# Patient Record
Sex: Male | Born: 1935 | Race: White | Hispanic: No | Marital: Married | Smoking: Never smoker
Health system: Southern US, Community
[De-identification: ages and names within clinical notes are randomized; demographics above are authoritative.]

## PROBLEM LIST (undated history)

## (undated) DIAGNOSIS — I639 Cerebral infarction, unspecified: Secondary | ICD-10-CM

## (undated) DIAGNOSIS — R7401 Elevation of levels of liver transaminase levels: Secondary | ICD-10-CM

## (undated) DIAGNOSIS — Z8551 Personal history of malignant neoplasm of bladder: Secondary | ICD-10-CM

## (undated) DIAGNOSIS — I350 Nonrheumatic aortic (valve) stenosis: Secondary | ICD-10-CM

## (undated) DIAGNOSIS — I5042 Chronic combined systolic (congestive) and diastolic (congestive) heart failure: Secondary | ICD-10-CM

## (undated) DIAGNOSIS — R0600 Dyspnea, unspecified: Secondary | ICD-10-CM

## (undated) DIAGNOSIS — E119 Type 2 diabetes mellitus without complications: Secondary | ICD-10-CM

## (undated) DIAGNOSIS — I509 Heart failure, unspecified: Secondary | ICD-10-CM

## (undated) DIAGNOSIS — Z87442 Personal history of urinary calculi: Secondary | ICD-10-CM

## (undated) DIAGNOSIS — Z8673 Personal history of transient ischemic attack (TIA), and cerebral infarction without residual deficits: Secondary | ICD-10-CM

## (undated) DIAGNOSIS — I10 Essential (primary) hypertension: Secondary | ICD-10-CM

## (undated) DIAGNOSIS — I480 Paroxysmal atrial fibrillation: Secondary | ICD-10-CM

## (undated) DIAGNOSIS — C801 Malignant (primary) neoplasm, unspecified: Secondary | ICD-10-CM

## (undated) DIAGNOSIS — I2510 Atherosclerotic heart disease of native coronary artery without angina pectoris: Secondary | ICD-10-CM

## (undated) HISTORY — DX: Nonrheumatic aortic (valve) stenosis: I35.0

## (undated) HISTORY — PX: JOINT REPLACEMENT: SHX530

## (undated) HISTORY — PX: HERNIA REPAIR: SHX51

## (undated) HISTORY — DX: Personal history of transient ischemic attack (TIA), and cerebral infarction without residual deficits: Z86.73

## (undated) HISTORY — DX: Personal history of malignant neoplasm of bladder: Z85.51

## (undated) HISTORY — DX: Paroxysmal atrial fibrillation: I48.0

## (undated) HISTORY — PX: CARDIAC CATHETERIZATION: SHX172

## (undated) HISTORY — PX: PROSTATE SURGERY: SHX751

## (undated) HISTORY — DX: Elevation of levels of liver transaminase levels: R74.01

## (undated) HISTORY — DX: Chronic combined systolic (congestive) and diastolic (congestive) heart failure: I50.42

---

## 2006-11-10 ENCOUNTER — Ambulatory Visit (HOSPITAL_COMMUNITY): Admission: RE | Admit: 2006-11-10 | Discharge: 2006-11-10 | Payer: Self-pay | Admitting: Urology

## 2011-09-01 DIAGNOSIS — N529 Male erectile dysfunction, unspecified: Secondary | ICD-10-CM | POA: Insufficient documentation

## 2011-09-01 DIAGNOSIS — R21 Rash and other nonspecific skin eruption: Secondary | ICD-10-CM | POA: Insufficient documentation

## 2013-02-14 DIAGNOSIS — R310 Gross hematuria: Secondary | ICD-10-CM | POA: Insufficient documentation

## 2013-02-18 DIAGNOSIS — C679 Malignant neoplasm of bladder, unspecified: Secondary | ICD-10-CM | POA: Insufficient documentation

## 2013-02-23 DIAGNOSIS — I10 Essential (primary) hypertension: Secondary | ICD-10-CM | POA: Insufficient documentation

## 2013-02-23 DIAGNOSIS — E785 Hyperlipidemia, unspecified: Secondary | ICD-10-CM

## 2013-02-23 DIAGNOSIS — I2510 Atherosclerotic heart disease of native coronary artery without angina pectoris: Secondary | ICD-10-CM

## 2013-02-23 DIAGNOSIS — R011 Cardiac murmur, unspecified: Secondary | ICD-10-CM

## 2013-02-23 HISTORY — DX: Cardiac murmur, unspecified: R01.1

## 2013-02-23 HISTORY — DX: Essential (primary) hypertension: I10

## 2013-02-23 HISTORY — DX: Atherosclerotic heart disease of native coronary artery without angina pectoris: I25.10

## 2013-02-23 HISTORY — DX: Hyperlipidemia, unspecified: E78.5

## 2013-02-25 DIAGNOSIS — E119 Type 2 diabetes mellitus without complications: Secondary | ICD-10-CM

## 2013-02-25 HISTORY — DX: Type 2 diabetes mellitus without complications: E11.9

## 2013-04-04 DIAGNOSIS — Z8546 Personal history of malignant neoplasm of prostate: Secondary | ICD-10-CM

## 2013-04-04 HISTORY — DX: Personal history of malignant neoplasm of prostate: Z85.46

## 2013-09-22 DIAGNOSIS — C61 Malignant neoplasm of prostate: Secondary | ICD-10-CM | POA: Insufficient documentation

## 2013-09-22 DIAGNOSIS — C679 Malignant neoplasm of bladder, unspecified: Secondary | ICD-10-CM

## 2013-09-22 HISTORY — DX: Malignant neoplasm of bladder, unspecified: C67.9

## 2014-06-23 DIAGNOSIS — C672 Malignant neoplasm of lateral wall of bladder: Secondary | ICD-10-CM | POA: Insufficient documentation

## 2015-01-01 DIAGNOSIS — C672 Malignant neoplasm of lateral wall of bladder: Secondary | ICD-10-CM | POA: Insufficient documentation

## 2015-04-09 DIAGNOSIS — I25119 Atherosclerotic heart disease of native coronary artery with unspecified angina pectoris: Secondary | ICD-10-CM | POA: Insufficient documentation

## 2015-04-09 DIAGNOSIS — I340 Nonrheumatic mitral (valve) insufficiency: Secondary | ICD-10-CM

## 2015-04-09 DIAGNOSIS — I351 Nonrheumatic aortic (valve) insufficiency: Secondary | ICD-10-CM | POA: Insufficient documentation

## 2015-04-09 HISTORY — DX: Atherosclerotic heart disease of native coronary artery with unspecified angina pectoris: I25.119

## 2015-04-09 HISTORY — DX: Nonrheumatic mitral (valve) insufficiency: I34.0

## 2015-04-09 HISTORY — DX: Nonrheumatic aortic (valve) insufficiency: I35.1

## 2015-06-06 DIAGNOSIS — I493 Ventricular premature depolarization: Secondary | ICD-10-CM

## 2015-06-06 HISTORY — DX: Ventricular premature depolarization: I49.3

## 2015-07-02 DIAGNOSIS — C61 Malignant neoplasm of prostate: Secondary | ICD-10-CM | POA: Insufficient documentation

## 2015-10-30 DIAGNOSIS — R011 Cardiac murmur, unspecified: Secondary | ICD-10-CM | POA: Diagnosis not present

## 2015-10-30 DIAGNOSIS — Z8551 Personal history of malignant neoplasm of bladder: Secondary | ICD-10-CM | POA: Diagnosis not present

## 2015-10-30 DIAGNOSIS — I2510 Atherosclerotic heart disease of native coronary artery without angina pectoris: Secondary | ICD-10-CM | POA: Diagnosis not present

## 2015-10-30 DIAGNOSIS — I10 Essential (primary) hypertension: Secondary | ICD-10-CM | POA: Diagnosis not present

## 2015-10-30 DIAGNOSIS — Z955 Presence of coronary angioplasty implant and graft: Secondary | ICD-10-CM | POA: Diagnosis not present

## 2015-10-30 DIAGNOSIS — I252 Old myocardial infarction: Secondary | ICD-10-CM | POA: Diagnosis not present

## 2015-10-30 DIAGNOSIS — N529 Male erectile dysfunction, unspecified: Secondary | ICD-10-CM | POA: Diagnosis not present

## 2015-10-30 DIAGNOSIS — E785 Hyperlipidemia, unspecified: Secondary | ICD-10-CM | POA: Diagnosis not present

## 2015-10-30 DIAGNOSIS — E119 Type 2 diabetes mellitus without complications: Secondary | ICD-10-CM | POA: Diagnosis not present

## 2015-10-30 DIAGNOSIS — C672 Malignant neoplasm of lateral wall of bladder: Secondary | ICD-10-CM | POA: Diagnosis not present

## 2015-10-30 DIAGNOSIS — H9190 Unspecified hearing loss, unspecified ear: Secondary | ICD-10-CM | POA: Diagnosis not present

## 2015-10-30 DIAGNOSIS — C679 Malignant neoplasm of bladder, unspecified: Secondary | ICD-10-CM | POA: Diagnosis not present

## 2015-11-22 DIAGNOSIS — E1169 Type 2 diabetes mellitus with other specified complication: Secondary | ICD-10-CM | POA: Diagnosis not present

## 2015-11-22 DIAGNOSIS — I129 Hypertensive chronic kidney disease with stage 1 through stage 4 chronic kidney disease, or unspecified chronic kidney disease: Secondary | ICD-10-CM | POA: Diagnosis not present

## 2015-11-22 DIAGNOSIS — E782 Mixed hyperlipidemia: Secondary | ICD-10-CM | POA: Diagnosis not present

## 2015-11-29 DIAGNOSIS — C672 Malignant neoplasm of lateral wall of bladder: Secondary | ICD-10-CM | POA: Diagnosis not present

## 2015-11-30 DIAGNOSIS — I129 Hypertensive chronic kidney disease with stage 1 through stage 4 chronic kidney disease, or unspecified chronic kidney disease: Secondary | ICD-10-CM | POA: Diagnosis not present

## 2015-11-30 DIAGNOSIS — N182 Chronic kidney disease, stage 2 (mild): Secondary | ICD-10-CM | POA: Diagnosis not present

## 2015-11-30 DIAGNOSIS — E785 Hyperlipidemia, unspecified: Secondary | ICD-10-CM | POA: Diagnosis not present

## 2015-11-30 DIAGNOSIS — E1149 Type 2 diabetes mellitus with other diabetic neurological complication: Secondary | ICD-10-CM | POA: Diagnosis not present

## 2015-11-30 DIAGNOSIS — E1165 Type 2 diabetes mellitus with hyperglycemia: Secondary | ICD-10-CM | POA: Diagnosis not present

## 2015-12-06 DIAGNOSIS — C672 Malignant neoplasm of lateral wall of bladder: Secondary | ICD-10-CM | POA: Diagnosis not present

## 2015-12-13 DIAGNOSIS — C672 Malignant neoplasm of lateral wall of bladder: Secondary | ICD-10-CM | POA: Diagnosis not present

## 2015-12-21 DIAGNOSIS — C672 Malignant neoplasm of lateral wall of bladder: Secondary | ICD-10-CM | POA: Diagnosis not present

## 2015-12-27 DIAGNOSIS — C672 Malignant neoplasm of lateral wall of bladder: Secondary | ICD-10-CM | POA: Diagnosis not present

## 2015-12-28 DIAGNOSIS — E1140 Type 2 diabetes mellitus with diabetic neuropathy, unspecified: Secondary | ICD-10-CM | POA: Diagnosis not present

## 2016-01-03 DIAGNOSIS — C672 Malignant neoplasm of lateral wall of bladder: Secondary | ICD-10-CM | POA: Diagnosis not present

## 2016-02-01 DIAGNOSIS — C672 Malignant neoplasm of lateral wall of bladder: Secondary | ICD-10-CM | POA: Diagnosis not present

## 2016-02-01 DIAGNOSIS — C61 Malignant neoplasm of prostate: Secondary | ICD-10-CM | POA: Diagnosis not present

## 2016-02-04 DIAGNOSIS — E119 Type 2 diabetes mellitus without complications: Secondary | ICD-10-CM | POA: Diagnosis not present

## 2016-02-04 DIAGNOSIS — Z7984 Long term (current) use of oral hypoglycemic drugs: Secondary | ICD-10-CM | POA: Diagnosis not present

## 2016-02-04 DIAGNOSIS — Z794 Long term (current) use of insulin: Secondary | ICD-10-CM | POA: Diagnosis not present

## 2016-02-04 DIAGNOSIS — H40003 Preglaucoma, unspecified, bilateral: Secondary | ICD-10-CM | POA: Diagnosis not present

## 2016-02-12 DIAGNOSIS — I10 Essential (primary) hypertension: Secondary | ICD-10-CM | POA: Diagnosis not present

## 2016-02-12 DIAGNOSIS — Z8551 Personal history of malignant neoplasm of bladder: Secondary | ICD-10-CM | POA: Diagnosis not present

## 2016-02-12 DIAGNOSIS — C672 Malignant neoplasm of lateral wall of bladder: Secondary | ICD-10-CM | POA: Diagnosis not present

## 2016-02-12 DIAGNOSIS — R011 Cardiac murmur, unspecified: Secondary | ICD-10-CM | POA: Diagnosis not present

## 2016-02-12 DIAGNOSIS — Z9079 Acquired absence of other genital organ(s): Secondary | ICD-10-CM | POA: Diagnosis not present

## 2016-02-12 DIAGNOSIS — N529 Male erectile dysfunction, unspecified: Secondary | ICD-10-CM | POA: Diagnosis not present

## 2016-02-12 DIAGNOSIS — N3289 Other specified disorders of bladder: Secondary | ICD-10-CM | POA: Diagnosis not present

## 2016-02-12 DIAGNOSIS — E119 Type 2 diabetes mellitus without complications: Secondary | ICD-10-CM | POA: Diagnosis not present

## 2016-02-12 DIAGNOSIS — E785 Hyperlipidemia, unspecified: Secondary | ICD-10-CM | POA: Diagnosis not present

## 2016-02-12 DIAGNOSIS — N3010 Interstitial cystitis (chronic) without hematuria: Secondary | ICD-10-CM | POA: Diagnosis not present

## 2016-02-12 DIAGNOSIS — C679 Malignant neoplasm of bladder, unspecified: Secondary | ICD-10-CM | POA: Diagnosis not present

## 2016-02-12 DIAGNOSIS — I2510 Atherosclerotic heart disease of native coronary artery without angina pectoris: Secondary | ICD-10-CM | POA: Diagnosis not present

## 2016-02-12 DIAGNOSIS — N135 Crossing vessel and stricture of ureter without hydronephrosis: Secondary | ICD-10-CM | POA: Diagnosis not present

## 2016-03-27 DIAGNOSIS — E782 Mixed hyperlipidemia: Secondary | ICD-10-CM | POA: Diagnosis not present

## 2016-03-27 DIAGNOSIS — E785 Hyperlipidemia, unspecified: Secondary | ICD-10-CM | POA: Diagnosis not present

## 2016-03-27 DIAGNOSIS — E1169 Type 2 diabetes mellitus with other specified complication: Secondary | ICD-10-CM | POA: Diagnosis not present

## 2016-03-28 DIAGNOSIS — E1140 Type 2 diabetes mellitus with diabetic neuropathy, unspecified: Secondary | ICD-10-CM | POA: Diagnosis not present

## 2016-04-07 DIAGNOSIS — Z0000 Encounter for general adult medical examination without abnormal findings: Secondary | ICD-10-CM | POA: Diagnosis not present

## 2016-04-07 DIAGNOSIS — Z139 Encounter for screening, unspecified: Secondary | ICD-10-CM | POA: Diagnosis not present

## 2016-04-07 DIAGNOSIS — N182 Chronic kidney disease, stage 2 (mild): Secondary | ICD-10-CM | POA: Diagnosis not present

## 2016-04-07 DIAGNOSIS — I129 Hypertensive chronic kidney disease with stage 1 through stage 4 chronic kidney disease, or unspecified chronic kidney disease: Secondary | ICD-10-CM | POA: Diagnosis not present

## 2016-04-07 DIAGNOSIS — E782 Mixed hyperlipidemia: Secondary | ICD-10-CM | POA: Diagnosis not present

## 2016-04-07 DIAGNOSIS — Z1389 Encounter for screening for other disorder: Secondary | ICD-10-CM | POA: Diagnosis not present

## 2016-04-07 DIAGNOSIS — Z7189 Other specified counseling: Secondary | ICD-10-CM | POA: Diagnosis not present

## 2016-04-07 DIAGNOSIS — E1169 Type 2 diabetes mellitus with other specified complication: Secondary | ICD-10-CM | POA: Diagnosis not present

## 2016-06-13 DIAGNOSIS — C672 Malignant neoplasm of lateral wall of bladder: Secondary | ICD-10-CM | POA: Diagnosis not present

## 2016-06-13 DIAGNOSIS — C61 Malignant neoplasm of prostate: Secondary | ICD-10-CM | POA: Diagnosis not present

## 2016-06-17 DIAGNOSIS — C672 Malignant neoplasm of lateral wall of bladder: Secondary | ICD-10-CM | POA: Diagnosis not present

## 2016-06-23 DIAGNOSIS — C672 Malignant neoplasm of lateral wall of bladder: Secondary | ICD-10-CM | POA: Diagnosis not present

## 2016-06-30 DIAGNOSIS — E1140 Type 2 diabetes mellitus with diabetic neuropathy, unspecified: Secondary | ICD-10-CM | POA: Diagnosis not present

## 2016-07-03 DIAGNOSIS — C672 Malignant neoplasm of lateral wall of bladder: Secondary | ICD-10-CM | POA: Diagnosis not present

## 2016-08-05 DIAGNOSIS — E1165 Type 2 diabetes mellitus with hyperglycemia: Secondary | ICD-10-CM | POA: Diagnosis not present

## 2016-08-05 DIAGNOSIS — Z23 Encounter for immunization: Secondary | ICD-10-CM | POA: Diagnosis not present

## 2016-08-05 DIAGNOSIS — E1169 Type 2 diabetes mellitus with other specified complication: Secondary | ICD-10-CM | POA: Diagnosis not present

## 2016-08-05 DIAGNOSIS — E1149 Type 2 diabetes mellitus with other diabetic neurological complication: Secondary | ICD-10-CM | POA: Diagnosis not present

## 2016-08-05 DIAGNOSIS — I129 Hypertensive chronic kidney disease with stage 1 through stage 4 chronic kidney disease, or unspecified chronic kidney disease: Secondary | ICD-10-CM | POA: Diagnosis not present

## 2016-08-11 DIAGNOSIS — E1169 Type 2 diabetes mellitus with other specified complication: Secondary | ICD-10-CM | POA: Diagnosis not present

## 2016-08-11 DIAGNOSIS — I129 Hypertensive chronic kidney disease with stage 1 through stage 4 chronic kidney disease, or unspecified chronic kidney disease: Secondary | ICD-10-CM | POA: Diagnosis not present

## 2016-08-11 DIAGNOSIS — E782 Mixed hyperlipidemia: Secondary | ICD-10-CM | POA: Diagnosis not present

## 2016-08-11 DIAGNOSIS — N182 Chronic kidney disease, stage 2 (mild): Secondary | ICD-10-CM | POA: Diagnosis not present

## 2016-09-03 DIAGNOSIS — S99911A Unspecified injury of right ankle, initial encounter: Secondary | ICD-10-CM | POA: Diagnosis not present

## 2016-09-03 DIAGNOSIS — Z6830 Body mass index (BMI) 30.0-30.9, adult: Secondary | ICD-10-CM | POA: Diagnosis not present

## 2016-09-04 DIAGNOSIS — I739 Peripheral vascular disease, unspecified: Secondary | ICD-10-CM | POA: Diagnosis not present

## 2016-09-04 DIAGNOSIS — M19071 Primary osteoarthritis, right ankle and foot: Secondary | ICD-10-CM | POA: Diagnosis not present

## 2016-09-04 DIAGNOSIS — S99911A Unspecified injury of right ankle, initial encounter: Secondary | ICD-10-CM | POA: Diagnosis not present

## 2016-09-10 DIAGNOSIS — I350 Nonrheumatic aortic (valve) stenosis: Secondary | ICD-10-CM | POA: Diagnosis not present

## 2016-09-10 DIAGNOSIS — I639 Cerebral infarction, unspecified: Secondary | ICD-10-CM | POA: Diagnosis not present

## 2016-09-10 DIAGNOSIS — I2510 Atherosclerotic heart disease of native coronary artery without angina pectoris: Secondary | ICD-10-CM | POA: Diagnosis not present

## 2016-09-10 DIAGNOSIS — K219 Gastro-esophageal reflux disease without esophagitis: Secondary | ICD-10-CM | POA: Diagnosis not present

## 2016-09-10 DIAGNOSIS — R42 Dizziness and giddiness: Secondary | ICD-10-CM | POA: Diagnosis not present

## 2016-09-10 DIAGNOSIS — Z8546 Personal history of malignant neoplasm of prostate: Secondary | ICD-10-CM | POA: Diagnosis not present

## 2016-09-10 DIAGNOSIS — I10 Essential (primary) hypertension: Secondary | ICD-10-CM | POA: Diagnosis not present

## 2016-09-10 DIAGNOSIS — I340 Nonrheumatic mitral (valve) insufficiency: Secondary | ICD-10-CM | POA: Diagnosis not present

## 2016-09-10 DIAGNOSIS — Z8679 Personal history of other diseases of the circulatory system: Secondary | ICD-10-CM | POA: Diagnosis not present

## 2016-09-10 DIAGNOSIS — I361 Nonrheumatic tricuspid (valve) insufficiency: Secondary | ICD-10-CM | POA: Diagnosis not present

## 2016-09-10 DIAGNOSIS — Z794 Long term (current) use of insulin: Secondary | ICD-10-CM | POA: Diagnosis not present

## 2016-09-10 DIAGNOSIS — E119 Type 2 diabetes mellitus without complications: Secondary | ICD-10-CM | POA: Diagnosis not present

## 2016-09-10 DIAGNOSIS — R51 Headache: Secondary | ICD-10-CM | POA: Diagnosis not present

## 2016-09-10 DIAGNOSIS — I6523 Occlusion and stenosis of bilateral carotid arteries: Secondary | ICD-10-CM | POA: Diagnosis not present

## 2016-09-10 DIAGNOSIS — M1990 Unspecified osteoarthritis, unspecified site: Secondary | ICD-10-CM | POA: Diagnosis not present

## 2016-09-10 DIAGNOSIS — E7800 Pure hypercholesterolemia, unspecified: Secondary | ICD-10-CM | POA: Diagnosis not present

## 2016-09-10 DIAGNOSIS — E785 Hyperlipidemia, unspecified: Secondary | ICD-10-CM | POA: Diagnosis not present

## 2016-09-11 DIAGNOSIS — I6523 Occlusion and stenosis of bilateral carotid arteries: Secondary | ICD-10-CM | POA: Diagnosis not present

## 2016-09-11 DIAGNOSIS — I10 Essential (primary) hypertension: Secondary | ICD-10-CM | POA: Diagnosis not present

## 2016-09-11 DIAGNOSIS — E119 Type 2 diabetes mellitus without complications: Secondary | ICD-10-CM | POA: Diagnosis not present

## 2016-09-11 DIAGNOSIS — Z8679 Personal history of other diseases of the circulatory system: Secondary | ICD-10-CM | POA: Diagnosis not present

## 2016-09-11 DIAGNOSIS — I639 Cerebral infarction, unspecified: Secondary | ICD-10-CM | POA: Diagnosis not present

## 2016-09-12 DIAGNOSIS — I639 Cerebral infarction, unspecified: Secondary | ICD-10-CM | POA: Diagnosis not present

## 2016-09-12 DIAGNOSIS — E119 Type 2 diabetes mellitus without complications: Secondary | ICD-10-CM | POA: Diagnosis not present

## 2016-09-12 DIAGNOSIS — I10 Essential (primary) hypertension: Secondary | ICD-10-CM | POA: Diagnosis not present

## 2016-09-12 DIAGNOSIS — Z8679 Personal history of other diseases of the circulatory system: Secondary | ICD-10-CM | POA: Diagnosis not present

## 2016-09-17 DIAGNOSIS — Z6830 Body mass index (BMI) 30.0-30.9, adult: Secondary | ICD-10-CM | POA: Diagnosis not present

## 2016-09-17 DIAGNOSIS — M19071 Primary osteoarthritis, right ankle and foot: Secondary | ICD-10-CM | POA: Diagnosis not present

## 2016-09-17 DIAGNOSIS — I639 Cerebral infarction, unspecified: Secondary | ICD-10-CM | POA: Diagnosis not present

## 2016-09-26 DIAGNOSIS — C61 Malignant neoplasm of prostate: Secondary | ICD-10-CM | POA: Diagnosis not present

## 2016-09-26 DIAGNOSIS — C672 Malignant neoplasm of lateral wall of bladder: Secondary | ICD-10-CM | POA: Diagnosis not present

## 2016-09-29 DIAGNOSIS — M25571 Pain in right ankle and joints of right foot: Secondary | ICD-10-CM | POA: Diagnosis not present

## 2016-09-29 DIAGNOSIS — R2681 Unsteadiness on feet: Secondary | ICD-10-CM | POA: Diagnosis not present

## 2016-09-29 DIAGNOSIS — R262 Difficulty in walking, not elsewhere classified: Secondary | ICD-10-CM | POA: Diagnosis not present

## 2016-09-30 DIAGNOSIS — E1140 Type 2 diabetes mellitus with diabetic neuropathy, unspecified: Secondary | ICD-10-CM | POA: Diagnosis not present

## 2016-10-21 DIAGNOSIS — E1149 Type 2 diabetes mellitus with other diabetic neurological complication: Secondary | ICD-10-CM | POA: Diagnosis not present

## 2016-10-21 DIAGNOSIS — E1169 Type 2 diabetes mellitus with other specified complication: Secondary | ICD-10-CM | POA: Diagnosis not present

## 2016-10-21 DIAGNOSIS — I129 Hypertensive chronic kidney disease with stage 1 through stage 4 chronic kidney disease, or unspecified chronic kidney disease: Secondary | ICD-10-CM | POA: Diagnosis not present

## 2016-10-27 DIAGNOSIS — E1165 Type 2 diabetes mellitus with hyperglycemia: Secondary | ICD-10-CM | POA: Diagnosis not present

## 2016-10-27 DIAGNOSIS — E785 Hyperlipidemia, unspecified: Secondary | ICD-10-CM | POA: Diagnosis not present

## 2016-10-27 DIAGNOSIS — I129 Hypertensive chronic kidney disease with stage 1 through stage 4 chronic kidney disease, or unspecified chronic kidney disease: Secondary | ICD-10-CM | POA: Diagnosis not present

## 2016-10-27 DIAGNOSIS — Z7189 Other specified counseling: Secondary | ICD-10-CM | POA: Diagnosis not present

## 2016-10-27 DIAGNOSIS — E1149 Type 2 diabetes mellitus with other diabetic neurological complication: Secondary | ICD-10-CM | POA: Diagnosis not present

## 2016-11-13 ENCOUNTER — Ambulatory Visit (INDEPENDENT_AMBULATORY_CARE_PROVIDER_SITE_OTHER): Payer: Medicare Other

## 2016-11-13 ENCOUNTER — Ambulatory Visit (INDEPENDENT_AMBULATORY_CARE_PROVIDER_SITE_OTHER): Payer: Medicare Other | Admitting: Sports Medicine

## 2016-11-13 ENCOUNTER — Encounter: Payer: Self-pay | Admitting: Sports Medicine

## 2016-11-13 DIAGNOSIS — G8929 Other chronic pain: Secondary | ICD-10-CM

## 2016-11-13 DIAGNOSIS — M79671 Pain in right foot: Secondary | ICD-10-CM

## 2016-11-13 DIAGNOSIS — R2681 Unsteadiness on feet: Secondary | ICD-10-CM | POA: Diagnosis not present

## 2016-11-13 DIAGNOSIS — M19079 Primary osteoarthritis, unspecified ankle and foot: Secondary | ICD-10-CM

## 2016-11-13 DIAGNOSIS — M25571 Pain in right ankle and joints of right foot: Secondary | ICD-10-CM | POA: Diagnosis not present

## 2016-11-13 NOTE — Progress Notes (Signed)
Subjective:  Bernard Kim is a 83 y.o. male patient who presents to office for evaluation of right chronic ankle pain. Patient complains of continued pain in the ankle. Patient has tried OTC brace, PT, and Tyelnol arthritis once a day with no relief in symptoms. Patient denies any other pedal complaints. States that he is afraid because his ankle gives out sometimes from under him and he is scared of fall that may cause other problems at his age.   Patient Active Problem List   Diagnosis Date Noted  . Malignant tumor of prostate ([place]) 07/02/2015  . PVC (premature ventricular contraction) 06/06/2015  . Aortic stenosis, mild 04/09/2015  . Coronary artery disease involving native coronary artery of native heart with angina pectoris ([place]) 04/09/2015  . Essential hypertension 04/09/2015  . Nonrheumatic aortic valve insufficiency 04/09/2015  . Non-rheumatic mitral regurgitation 04/09/2015  . Malignant neoplasm of lateral wall of urinary bladder ([place]) 01/01/2015  . Malignant neoplasm of lateral wall of bladder ([place]) 06/23/2014  . Bladder cancer ([place]) 09/22/2013  . Prostate cancer ([place]) 09/22/2013  . History of prostate cancer 04/04/2013  . Diabetes mellitus, type 2 ([place]) 02/25/2013  . Coronary artery disease 02/23/2013  . Hyperlipidemia 02/23/2013  . Hypertension 02/23/2013  . Murmur 02/23/2013  . Malignant neoplasm of urinary bladder ([place]) 02/18/2013  . Gross hematuria 02/14/2013  . ED (erectile dysfunction) of organic origin 09/01/2011  . Rash of groin 09/01/2011    No current outpatient prescriptions on file prior to visit.   No current facility-administered medications on file prior to visit.     No Known Allergies  Objective:  General: Alert and oriented x3 in no acute distress  Dermatology: No open lesions bilateral lower extremities, no webspace macerations, no ecchymosis bilateral, all nails x 10 are well manicured.  Vascular: Dorsalis Pedis and Posterior Tibial pedal pulses  palpable, Capillary Fill Time 3 seconds,(-) pedal hair growth bilateral, Varcosities and trace edema bilateral lower extremities R>L, Temperature gradient within normal limits.  Neurology: Johney Maine sensation intact via light touch bilateral. (- )Tinels sign bilateral.   Musculoskeletal: Mild tenderness with palpation at Right ankle diffusely; Worse with weightbearing. Negative talar tilt, Negative tib-fib stress, Mild instability. No pain with calf compression bilateral. Range of motion limited on right ankle. Strength within normal limits in all groups bilateral.   Gait: Antalgic gait  Xrays  Right foot/Ankle   Impression:End stage ankle arthritis with inferior heel spur and lesser hammertoe. No other acute findings.   Assessment and Plan: Problem List Items Addressed This Visit    None    Visit Diagnoses    Chronic pain of right ankle    -  Primary   Relevant Orders   DG Foot 2 Views Right (Completed)   DG Ankle 2 Views Right (Completed)   Arthritis of ankle       Unsteady gait           -Complete examination performed -Xrays reviewed -Discussed treatement options for ankle arthritis  -Advised Tylenol arthritis twice daily -Rest and ice at end of day -Rx Moore balance brace -Patient declined steroid injection to right ankle at this time -Patient to return to office for bracing with Betha or sooner if condition worsens.  Landis Martins, DPM

## 2016-11-26 ENCOUNTER — Ambulatory Visit (INDEPENDENT_AMBULATORY_CARE_PROVIDER_SITE_OTHER): Payer: Medicare Other | Admitting: Sports Medicine

## 2016-11-26 DIAGNOSIS — M25571 Pain in right ankle and joints of right foot: Secondary | ICD-10-CM

## 2016-11-26 DIAGNOSIS — M19079 Primary osteoarthritis, unspecified ankle and foot: Secondary | ICD-10-CM

## 2016-11-26 DIAGNOSIS — R2681 Unsteadiness on feet: Secondary | ICD-10-CM

## 2016-11-26 DIAGNOSIS — G8929 Other chronic pain: Secondary | ICD-10-CM

## 2016-11-26 NOTE — Progress Notes (Signed)
Patient met with Bernard Kim and was fitted for [facility] balance brace. Patient to return to pick up brace when arrives -Dr. Cannon Kettle

## 2016-12-24 ENCOUNTER — Ambulatory Visit (INDEPENDENT_AMBULATORY_CARE_PROVIDER_SITE_OTHER): Payer: Medicare Other | Admitting: Sports Medicine

## 2016-12-24 DIAGNOSIS — M25571 Pain in right ankle and joints of right foot: Secondary | ICD-10-CM | POA: Diagnosis not present

## 2016-12-24 DIAGNOSIS — R2681 Unsteadiness on feet: Secondary | ICD-10-CM | POA: Diagnosis not present

## 2016-12-24 DIAGNOSIS — G8929 Other chronic pain: Secondary | ICD-10-CM

## 2016-12-24 DIAGNOSIS — M19079 Primary osteoarthritis, unspecified ankle and foot: Secondary | ICD-10-CM

## 2016-12-24 NOTE — Progress Notes (Signed)
Patient met with Bernard Kim. Moore Balance braces bilateral dispensed to patient. Patient to return as scheduled for follow up. -Dr. Cannon Kettle

## 2016-12-29 DIAGNOSIS — C672 Malignant neoplasm of lateral wall of bladder: Secondary | ICD-10-CM | POA: Diagnosis not present

## 2016-12-29 DIAGNOSIS — C61 Malignant neoplasm of prostate: Secondary | ICD-10-CM | POA: Diagnosis not present

## 2017-01-09 DIAGNOSIS — C672 Malignant neoplasm of lateral wall of bladder: Secondary | ICD-10-CM | POA: Diagnosis not present

## 2017-01-19 DIAGNOSIS — E1169 Type 2 diabetes mellitus with other specified complication: Secondary | ICD-10-CM | POA: Diagnosis not present

## 2017-01-19 DIAGNOSIS — I129 Hypertensive chronic kidney disease with stage 1 through stage 4 chronic kidney disease, or unspecified chronic kidney disease: Secondary | ICD-10-CM | POA: Diagnosis not present

## 2017-01-19 DIAGNOSIS — E1149 Type 2 diabetes mellitus with other diabetic neurological complication: Secondary | ICD-10-CM | POA: Diagnosis not present

## 2017-01-19 DIAGNOSIS — C672 Malignant neoplasm of lateral wall of bladder: Secondary | ICD-10-CM | POA: Diagnosis not present

## 2017-01-26 DIAGNOSIS — C672 Malignant neoplasm of lateral wall of bladder: Secondary | ICD-10-CM | POA: Diagnosis not present

## 2017-01-28 DIAGNOSIS — E782 Mixed hyperlipidemia: Secondary | ICD-10-CM | POA: Diagnosis not present

## 2017-01-28 DIAGNOSIS — I129 Hypertensive chronic kidney disease with stage 1 through stage 4 chronic kidney disease, or unspecified chronic kidney disease: Secondary | ICD-10-CM | POA: Diagnosis not present

## 2017-01-28 DIAGNOSIS — N182 Chronic kidney disease, stage 2 (mild): Secondary | ICD-10-CM | POA: Diagnosis not present

## 2017-01-28 DIAGNOSIS — E1169 Type 2 diabetes mellitus with other specified complication: Secondary | ICD-10-CM | POA: Diagnosis not present

## 2017-02-03 DIAGNOSIS — L821 Other seborrheic keratosis: Secondary | ICD-10-CM | POA: Diagnosis not present

## 2017-02-03 DIAGNOSIS — C44729 Squamous cell carcinoma of skin of left lower limb, including hip: Secondary | ICD-10-CM | POA: Diagnosis not present

## 2017-02-03 DIAGNOSIS — C44529 Squamous cell carcinoma of skin of other part of trunk: Secondary | ICD-10-CM | POA: Diagnosis not present

## 2017-02-03 DIAGNOSIS — L578 Other skin changes due to chronic exposure to nonionizing radiation: Secondary | ICD-10-CM | POA: Diagnosis not present

## 2017-02-04 ENCOUNTER — Encounter: Payer: Self-pay | Admitting: Sports Medicine

## 2017-02-04 ENCOUNTER — Ambulatory Visit (INDEPENDENT_AMBULATORY_CARE_PROVIDER_SITE_OTHER): Payer: Medicare Other | Admitting: Sports Medicine

## 2017-02-04 DIAGNOSIS — M2142 Flat foot [pes planus] (acquired), left foot: Secondary | ICD-10-CM

## 2017-02-04 DIAGNOSIS — M19079 Primary osteoarthritis, unspecified ankle and foot: Secondary | ICD-10-CM

## 2017-02-04 DIAGNOSIS — R2681 Unsteadiness on feet: Secondary | ICD-10-CM

## 2017-02-04 DIAGNOSIS — M25571 Pain in right ankle and joints of right foot: Secondary | ICD-10-CM | POA: Diagnosis not present

## 2017-02-04 DIAGNOSIS — M2141 Flat foot [pes planus] (acquired), right foot: Secondary | ICD-10-CM

## 2017-02-04 DIAGNOSIS — G8929 Other chronic pain: Secondary | ICD-10-CM

## 2017-02-04 DIAGNOSIS — E0842 Diabetes mellitus due to underlying condition with diabetic polyneuropathy: Secondary | ICD-10-CM

## 2017-02-04 NOTE — Progress Notes (Signed)
Subjective:  BOYD BUFFALO is a 83 y.o. male patient who returnss to office for evaluation for Balance braces to be check and to discuss diabetic shoes. Patient states that braces have helped with stability and pain. Patient states he desires to have diabetic shoes to wear with braces. Denies any other pedal complaints.   Patient Active Problem List   Diagnosis Date Noted  . Malignant tumor of prostate ([place]) 07/02/2015  . PVC (premature ventricular contraction) 06/06/2015  . Aortic stenosis, mild 04/09/2015  . Coronary artery disease involving native coronary artery of native heart with angina pectoris ([place]) 04/09/2015  . Essential hypertension 04/09/2015  . Nonrheumatic aortic valve insufficiency 04/09/2015  . Non-rheumatic mitral regurgitation 04/09/2015  . Malignant neoplasm of lateral wall of urinary bladder ([place]) 01/01/2015  . Malignant neoplasm of lateral wall of bladder ([place]) 06/23/2014  . Bladder cancer ([place]) 09/22/2013  . Prostate cancer ([place]) 09/22/2013  . History of prostate cancer 04/04/2013  . Diabetes mellitus, type 2 ([place]) 02/25/2013  . Coronary artery disease 02/23/2013  . Hyperlipidemia 02/23/2013  . Hypertension 02/23/2013  . Murmur 02/23/2013  . Malignant neoplasm of urinary bladder ([place]) 02/18/2013  . Gross hematuria 02/14/2013  . ED (erectile dysfunction) of organic origin 09/01/2011  . Rash of groin 09/01/2011    Current Outpatient Prescriptions on File Prior to Visit  Medication Sig Dispense Refill  . aspirin EC 81 MG tablet Take 81 mg by mouth.    . clopidogrel (PLAVIX) 75 MG tablet Take 75 mg by mouth.    . docusate sodium (COLACE) 100 MG capsule Take 100 mg by mouth.    . Fluocinolone Acetonide 0.01 % OIL as needed.     Marland Kitchen HYDROcodone-acetaminophen (NORCO/VICODIN) 5-325 MG tablet Take by mouth.    . Insulin Glargine (LANTUS SOLOSTAR) 100 UNIT/ML Solostar Pen     . insulin lispro (HUMALOG) 100 UNIT/ML KiwkPen Inject into the skin.    . Insulin Pen Needle  (BD PEN NEEDLE NANO U/F) 32G X 4 MM MISC USE 1 DAILY    . losartan (COZAAR) 25 MG tablet Take 25 mg by mouth.    . lovastatin (MEVACOR) 40 MG tablet TK 1 T PO QHS  1  . lovastatin (MEVACOR) 40 MG tablet Take 40 mg by mouth.    . metoprolol tartrate (LOPRESSOR) 25 MG tablet     . nitroGLYCERIN (NITROSTAT) 0.4 MG SL tablet     . pneumococcal 13-valent conjugate vaccine (PREVNAR 13) SUSP injection inject 0.5 milliliter intramuscularly    . sitaGLIPtin-metformin (JANUMET) 50-1000 MG tablet Take by mouth.    . triamcinolone cream (KENALOG) 0.1 %      No current facility-administered medications on file prior to visit.     No Known Allergies  Objective:  General: Alert and oriented x3 in no acute distress  Dermatology: No open lesions bilateral lower extremities, no webspace macerations, no ecchymosis bilateral, all nails x 10 are well manicured.  Vascular: Dorsalis Pedis and Posterior Tibial pedal pulses palpable, Capillary Fill Time 3 seconds,(-) pedal hair growth bilateral, Varcosities and trace edema bilateral lower extremities R>L, Temperature gradient within normal limits.  Neurology: Gross sensation intact via light touch bilateral. Protective and vibratory sensation diminished. (- )Tinels sign bilateral.   Musculoskeletal: Decreased tenderness with palpation at Right ankle diffusely. Planus foot type. No pain with calf compression bilateral. Range of motion limited on right ankle. Strength within normal limits in all groups bilateral.   Gait:Controlled with moore balance braces   Assessment and Plan:  Problem List Items Addressed This Visit    None    Visit Diagnoses    Unsteady gait    -  Primary   Arthritis of ankle       Chronic pain of right ankle       Pes planus of both feet       Diabetic polyneuropathy associated with diabetes mellitus due to underlying condition (HCC)           -Complete examination performed -Continue with Moore balance braces bilateral -Safe  step diabetic shoe order form was completed; office to contact primary care for approval / certification;  Office to arrange shoe fitting and dispensing. -Patient to return to office for diabetic shoe measurements and impressions when called or sooner if condition worsens.  Landis Martins, DPM

## 2017-02-05 DIAGNOSIS — E1140 Type 2 diabetes mellitus with diabetic neuropathy, unspecified: Secondary | ICD-10-CM | POA: Diagnosis not present

## 2017-02-09 DIAGNOSIS — E119 Type 2 diabetes mellitus without complications: Secondary | ICD-10-CM | POA: Diagnosis not present

## 2017-02-09 DIAGNOSIS — H40003 Preglaucoma, unspecified, bilateral: Secondary | ICD-10-CM | POA: Diagnosis not present

## 2017-02-09 DIAGNOSIS — Z794 Long term (current) use of insulin: Secondary | ICD-10-CM | POA: Diagnosis not present

## 2017-02-09 DIAGNOSIS — Z7984 Long term (current) use of oral hypoglycemic drugs: Secondary | ICD-10-CM | POA: Diagnosis not present

## 2017-02-10 DIAGNOSIS — C44519 Basal cell carcinoma of skin of other part of trunk: Secondary | ICD-10-CM | POA: Diagnosis not present

## 2017-02-12 DIAGNOSIS — C44719 Basal cell carcinoma of skin of left lower limb, including hip: Secondary | ICD-10-CM | POA: Diagnosis not present

## 2017-02-16 DIAGNOSIS — C44519 Basal cell carcinoma of skin of other part of trunk: Secondary | ICD-10-CM | POA: Diagnosis not present

## 2017-04-06 DIAGNOSIS — Z8551 Personal history of malignant neoplasm of bladder: Secondary | ICD-10-CM | POA: Diagnosis not present

## 2017-04-06 DIAGNOSIS — Z8546 Personal history of malignant neoplasm of prostate: Secondary | ICD-10-CM | POA: Diagnosis not present

## 2017-04-30 DIAGNOSIS — E1169 Type 2 diabetes mellitus with other specified complication: Secondary | ICD-10-CM | POA: Diagnosis not present

## 2017-04-30 DIAGNOSIS — I129 Hypertensive chronic kidney disease with stage 1 through stage 4 chronic kidney disease, or unspecified chronic kidney disease: Secondary | ICD-10-CM | POA: Diagnosis not present

## 2017-04-30 DIAGNOSIS — E1149 Type 2 diabetes mellitus with other diabetic neurological complication: Secondary | ICD-10-CM | POA: Diagnosis not present

## 2017-05-08 DIAGNOSIS — E1140 Type 2 diabetes mellitus with diabetic neuropathy, unspecified: Secondary | ICD-10-CM | POA: Diagnosis not present

## 2017-05-15 DIAGNOSIS — E1165 Type 2 diabetes mellitus with hyperglycemia: Secondary | ICD-10-CM | POA: Diagnosis not present

## 2017-05-15 DIAGNOSIS — E1149 Type 2 diabetes mellitus with other diabetic neurological complication: Secondary | ICD-10-CM | POA: Diagnosis not present

## 2017-05-15 DIAGNOSIS — E785 Hyperlipidemia, unspecified: Secondary | ICD-10-CM | POA: Diagnosis not present

## 2017-05-15 DIAGNOSIS — I129 Hypertensive chronic kidney disease with stage 1 through stage 4 chronic kidney disease, or unspecified chronic kidney disease: Secondary | ICD-10-CM | POA: Diagnosis not present

## 2017-07-10 DIAGNOSIS — Z23 Encounter for immunization: Secondary | ICD-10-CM | POA: Diagnosis not present

## 2017-07-13 DIAGNOSIS — Z8546 Personal history of malignant neoplasm of prostate: Secondary | ICD-10-CM | POA: Diagnosis not present

## 2017-07-13 DIAGNOSIS — N5231 Erectile dysfunction following radical prostatectomy: Secondary | ICD-10-CM | POA: Diagnosis not present

## 2017-07-13 DIAGNOSIS — Z8551 Personal history of malignant neoplasm of bladder: Secondary | ICD-10-CM | POA: Diagnosis not present

## 2017-07-20 DIAGNOSIS — C672 Malignant neoplasm of lateral wall of bladder: Secondary | ICD-10-CM | POA: Diagnosis not present

## 2017-07-27 DIAGNOSIS — C672 Malignant neoplasm of lateral wall of bladder: Secondary | ICD-10-CM | POA: Diagnosis not present

## 2017-07-27 DIAGNOSIS — N390 Urinary tract infection, site not specified: Secondary | ICD-10-CM | POA: Diagnosis not present

## 2017-08-03 DIAGNOSIS — N390 Urinary tract infection, site not specified: Secondary | ICD-10-CM | POA: Diagnosis not present

## 2017-08-03 DIAGNOSIS — C672 Malignant neoplasm of lateral wall of bladder: Secondary | ICD-10-CM | POA: Diagnosis not present

## 2017-08-04 DIAGNOSIS — R300 Dysuria: Secondary | ICD-10-CM | POA: Diagnosis not present

## 2017-08-04 DIAGNOSIS — R0602 Shortness of breath: Secondary | ICD-10-CM | POA: Diagnosis not present

## 2017-08-05 DIAGNOSIS — I350 Nonrheumatic aortic (valve) stenosis: Secondary | ICD-10-CM | POA: Diagnosis not present

## 2017-08-05 DIAGNOSIS — I10 Essential (primary) hypertension: Secondary | ICD-10-CM | POA: Diagnosis not present

## 2017-08-05 DIAGNOSIS — R002 Palpitations: Secondary | ICD-10-CM | POA: Diagnosis not present

## 2017-08-05 DIAGNOSIS — I493 Ventricular premature depolarization: Secondary | ICD-10-CM | POA: Diagnosis not present

## 2017-08-05 DIAGNOSIS — R0600 Dyspnea, unspecified: Secondary | ICD-10-CM | POA: Insufficient documentation

## 2017-08-05 DIAGNOSIS — I252 Old myocardial infarction: Secondary | ICD-10-CM | POA: Insufficient documentation

## 2017-08-05 DIAGNOSIS — R0602 Shortness of breath: Secondary | ICD-10-CM | POA: Diagnosis not present

## 2017-08-05 DIAGNOSIS — I2510 Atherosclerotic heart disease of native coronary artery without angina pectoris: Secondary | ICD-10-CM | POA: Diagnosis not present

## 2017-08-05 DIAGNOSIS — R0609 Other forms of dyspnea: Secondary | ICD-10-CM | POA: Insufficient documentation

## 2017-08-12 DIAGNOSIS — E1140 Type 2 diabetes mellitus with diabetic neuropathy, unspecified: Secondary | ICD-10-CM | POA: Diagnosis not present

## 2017-08-21 DIAGNOSIS — I350 Nonrheumatic aortic (valve) stenosis: Secondary | ICD-10-CM | POA: Diagnosis not present

## 2017-08-30 DIAGNOSIS — I509 Heart failure, unspecified: Secondary | ICD-10-CM | POA: Diagnosis not present

## 2017-08-30 DIAGNOSIS — I110 Hypertensive heart disease with heart failure: Secondary | ICD-10-CM | POA: Diagnosis not present

## 2017-08-30 DIAGNOSIS — I248 Other forms of acute ischemic heart disease: Secondary | ICD-10-CM | POA: Diagnosis not present

## 2017-08-30 DIAGNOSIS — E785 Hyperlipidemia, unspecified: Secondary | ICD-10-CM | POA: Diagnosis not present

## 2017-08-30 DIAGNOSIS — E119 Type 2 diabetes mellitus without complications: Secondary | ICD-10-CM | POA: Diagnosis not present

## 2017-08-30 DIAGNOSIS — I5023 Acute on chronic systolic (congestive) heart failure: Secondary | ICD-10-CM | POA: Diagnosis not present

## 2017-08-30 DIAGNOSIS — R600 Localized edema: Secondary | ICD-10-CM | POA: Diagnosis not present

## 2017-08-30 DIAGNOSIS — I480 Paroxysmal atrial fibrillation: Secondary | ICD-10-CM | POA: Diagnosis not present

## 2017-08-30 DIAGNOSIS — I2699 Other pulmonary embolism without acute cor pulmonale: Secondary | ICD-10-CM | POA: Diagnosis not present

## 2017-08-30 DIAGNOSIS — J9601 Acute respiratory failure with hypoxia: Secondary | ICD-10-CM | POA: Diagnosis not present

## 2017-08-30 DIAGNOSIS — Z794 Long term (current) use of insulin: Secondary | ICD-10-CM | POA: Diagnosis not present

## 2017-08-30 DIAGNOSIS — R0602 Shortness of breath: Secondary | ICD-10-CM | POA: Diagnosis not present

## 2017-08-30 DIAGNOSIS — Z8679 Personal history of other diseases of the circulatory system: Secondary | ICD-10-CM | POA: Diagnosis not present

## 2017-08-30 DIAGNOSIS — I10 Essential (primary) hypertension: Secondary | ICD-10-CM | POA: Diagnosis not present

## 2017-08-30 DIAGNOSIS — N179 Acute kidney failure, unspecified: Secondary | ICD-10-CM | POA: Diagnosis not present

## 2017-08-30 DIAGNOSIS — R05 Cough: Secondary | ICD-10-CM | POA: Diagnosis not present

## 2017-08-30 DIAGNOSIS — I25110 Atherosclerotic heart disease of native coronary artery with unstable angina pectoris: Secondary | ICD-10-CM | POA: Diagnosis not present

## 2017-08-30 DIAGNOSIS — R748 Abnormal levels of other serum enzymes: Secondary | ICD-10-CM | POA: Diagnosis not present

## 2017-08-31 ENCOUNTER — Other Ambulatory Visit: Payer: Self-pay

## 2017-08-31 ENCOUNTER — Encounter (HOSPITAL_COMMUNITY): Payer: Self-pay | Admitting: *Deleted

## 2017-08-31 ENCOUNTER — Inpatient Hospital Stay (HOSPITAL_COMMUNITY)
Admission: AD | Admit: 2017-08-31 | Discharge: 2017-09-07 | DRG: 286 | Disposition: A | Discharge status: Home Health | Payer: Medicare Other | Source: Other Acute Inpatient Hospital | Attending: Internal Medicine | Admitting: Internal Medicine

## 2017-08-31 DIAGNOSIS — E119 Type 2 diabetes mellitus without complications: Secondary | ICD-10-CM | POA: Diagnosis not present

## 2017-08-31 DIAGNOSIS — Z86711 Personal history of pulmonary embolism: Secondary | ICD-10-CM | POA: Diagnosis not present

## 2017-08-31 DIAGNOSIS — I4892 Unspecified atrial flutter: Secondary | ICD-10-CM | POA: Diagnosis present

## 2017-08-31 DIAGNOSIS — Z794 Long term (current) use of insulin: Secondary | ICD-10-CM | POA: Diagnosis not present

## 2017-08-31 DIAGNOSIS — I255 Ischemic cardiomyopathy: Secondary | ICD-10-CM | POA: Diagnosis present

## 2017-08-31 DIAGNOSIS — E785 Hyperlipidemia, unspecified: Secondary | ICD-10-CM | POA: Diagnosis present

## 2017-08-31 DIAGNOSIS — I509 Heart failure, unspecified: Secondary | ICD-10-CM | POA: Diagnosis not present

## 2017-08-31 DIAGNOSIS — Z79891 Long term (current) use of opiate analgesic: Secondary | ICD-10-CM

## 2017-08-31 DIAGNOSIS — R0681 Apnea, not elsewhere classified: Secondary | ICD-10-CM | POA: Diagnosis present

## 2017-08-31 DIAGNOSIS — I350 Nonrheumatic aortic (valve) stenosis: Secondary | ICD-10-CM | POA: Diagnosis not present

## 2017-08-31 DIAGNOSIS — Z955 Presence of coronary angioplasty implant and graft: Secondary | ICD-10-CM

## 2017-08-31 DIAGNOSIS — I4891 Unspecified atrial fibrillation: Secondary | ICD-10-CM | POA: Diagnosis not present

## 2017-08-31 DIAGNOSIS — Z7982 Long term (current) use of aspirin: Secondary | ICD-10-CM | POA: Diagnosis not present

## 2017-08-31 DIAGNOSIS — I2699 Other pulmonary embolism without acute cor pulmonale: Secondary | ICD-10-CM | POA: Diagnosis present

## 2017-08-31 DIAGNOSIS — J9601 Acute respiratory failure with hypoxia: Secondary | ICD-10-CM | POA: Diagnosis present

## 2017-08-31 DIAGNOSIS — I10 Essential (primary) hypertension: Secondary | ICD-10-CM | POA: Diagnosis not present

## 2017-08-31 DIAGNOSIS — I471 Supraventricular tachycardia: Secondary | ICD-10-CM | POA: Diagnosis present

## 2017-08-31 DIAGNOSIS — R7401 Elevation of levels of liver transaminase levels: Secondary | ICD-10-CM

## 2017-08-31 DIAGNOSIS — E878 Other disorders of electrolyte and fluid balance, not elsewhere classified: Secondary | ICD-10-CM | POA: Diagnosis not present

## 2017-08-31 DIAGNOSIS — R945 Abnormal results of liver function studies: Secondary | ICD-10-CM | POA: Diagnosis not present

## 2017-08-31 DIAGNOSIS — R740 Nonspecific elevation of levels of transaminase and lactic acid dehydrogenase [LDH]: Secondary | ICD-10-CM | POA: Diagnosis not present

## 2017-08-31 DIAGNOSIS — J9690 Respiratory failure, unspecified, unspecified whether with hypoxia or hypercapnia: Secondary | ICD-10-CM

## 2017-08-31 DIAGNOSIS — N170 Acute kidney failure with tubular necrosis: Secondary | ICD-10-CM | POA: Diagnosis present

## 2017-08-31 DIAGNOSIS — I5041 Acute combined systolic (congestive) and diastolic (congestive) heart failure: Secondary | ICD-10-CM | POA: Diagnosis not present

## 2017-08-31 DIAGNOSIS — I493 Ventricular premature depolarization: Secondary | ICD-10-CM | POA: Diagnosis present

## 2017-08-31 DIAGNOSIS — Z7902 Long term (current) use of antithrombotics/antiplatelets: Secondary | ICD-10-CM | POA: Diagnosis not present

## 2017-08-31 DIAGNOSIS — D696 Thrombocytopenia, unspecified: Secondary | ICD-10-CM | POA: Diagnosis present

## 2017-08-31 DIAGNOSIS — I959 Hypotension, unspecified: Secondary | ICD-10-CM | POA: Diagnosis not present

## 2017-08-31 DIAGNOSIS — R0602 Shortness of breath: Secondary | ICD-10-CM | POA: Diagnosis not present

## 2017-08-31 DIAGNOSIS — E875 Hyperkalemia: Secondary | ICD-10-CM | POA: Diagnosis present

## 2017-08-31 DIAGNOSIS — E118 Type 2 diabetes mellitus with unspecified complications: Secondary | ICD-10-CM

## 2017-08-31 DIAGNOSIS — I5023 Acute on chronic systolic (congestive) heart failure: Secondary | ICD-10-CM | POA: Diagnosis present

## 2017-08-31 DIAGNOSIS — E872 Acidosis: Secondary | ICD-10-CM | POA: Diagnosis present

## 2017-08-31 DIAGNOSIS — I340 Nonrheumatic mitral (valve) insufficiency: Secondary | ICD-10-CM | POA: Diagnosis present

## 2017-08-31 DIAGNOSIS — I25110 Atherosclerotic heart disease of native coronary artery with unstable angina pectoris: Secondary | ICD-10-CM | POA: Diagnosis not present

## 2017-08-31 DIAGNOSIS — Z8546 Personal history of malignant neoplasm of prostate: Secondary | ICD-10-CM

## 2017-08-31 DIAGNOSIS — I252 Old myocardial infarction: Secondary | ICD-10-CM

## 2017-08-31 DIAGNOSIS — I110 Hypertensive heart disease with heart failure: Principal | ICD-10-CM | POA: Diagnosis present

## 2017-08-31 DIAGNOSIS — Z8673 Personal history of transient ischemic attack (TIA), and cerebral infarction without residual deficits: Secondary | ICD-10-CM

## 2017-08-31 DIAGNOSIS — J9621 Acute and chronic respiratory failure with hypoxia: Secondary | ICD-10-CM | POA: Diagnosis present

## 2017-08-31 DIAGNOSIS — R0603 Acute respiratory distress: Secondary | ICD-10-CM

## 2017-08-31 DIAGNOSIS — K7200 Acute and subacute hepatic failure without coma: Secondary | ICD-10-CM | POA: Diagnosis not present

## 2017-08-31 DIAGNOSIS — J90 Pleural effusion, not elsewhere classified: Secondary | ICD-10-CM

## 2017-08-31 DIAGNOSIS — Z8551 Personal history of malignant neoplasm of bladder: Secondary | ICD-10-CM

## 2017-08-31 DIAGNOSIS — I480 Paroxysmal atrial fibrillation: Secondary | ICD-10-CM | POA: Diagnosis not present

## 2017-08-31 DIAGNOSIS — I5021 Acute systolic (congestive) heart failure: Secondary | ICD-10-CM | POA: Diagnosis not present

## 2017-08-31 DIAGNOSIS — N179 Acute kidney failure, unspecified: Secondary | ICD-10-CM | POA: Diagnosis not present

## 2017-08-31 DIAGNOSIS — R0600 Dyspnea, unspecified: Secondary | ICD-10-CM | POA: Diagnosis present

## 2017-08-31 DIAGNOSIS — I083 Combined rheumatic disorders of mitral, aortic and tricuspid valves: Secondary | ICD-10-CM | POA: Diagnosis present

## 2017-08-31 DIAGNOSIS — I2510 Atherosclerotic heart disease of native coronary artery without angina pectoris: Secondary | ICD-10-CM | POA: Diagnosis present

## 2017-08-31 DIAGNOSIS — Z8679 Personal history of other diseases of the circulatory system: Secondary | ICD-10-CM | POA: Diagnosis not present

## 2017-08-31 DIAGNOSIS — R748 Abnormal levels of other serum enzymes: Secondary | ICD-10-CM | POA: Diagnosis not present

## 2017-08-31 DIAGNOSIS — J810 Acute pulmonary edema: Secondary | ICD-10-CM | POA: Diagnosis not present

## 2017-08-31 HISTORY — DX: Cerebral infarction, unspecified: I63.9

## 2017-08-31 HISTORY — DX: Malignant (primary) neoplasm, unspecified: C80.1

## 2017-08-31 HISTORY — DX: Atherosclerotic heart disease of native coronary artery without angina pectoris: I25.10

## 2017-08-31 HISTORY — DX: Paroxysmal atrial fibrillation: I48.0

## 2017-08-31 HISTORY — DX: Heart failure, unspecified: I50.9

## 2017-08-31 HISTORY — DX: Essential (primary) hypertension: I10

## 2017-08-31 HISTORY — DX: Dyspnea, unspecified: R06.00

## 2017-08-31 LAB — BRAIN NATRIURETIC PEPTIDE: B NATRIURETIC PEPTIDE 5: 586.5 pg/mL — AB (ref 0.0–100.0)

## 2017-08-31 LAB — BLOOD GAS, ARTERIAL
Acid-base deficit: 7 mmol/L — ABNORMAL HIGH (ref 0.0–2.0)
Bicarbonate: 18.2 mmol/L — ABNORMAL LOW (ref 20.0–28.0)
Delivery systems: POSITIVE
Drawn by: 275531
Expiratory PAP: 6
FIO2: 0.4
Inspiratory PAP: 12
Mode: POSITIVE
O2 Saturation: 98 %
Patient temperature: 98.6
RATE: 12 resp/min
pCO2 arterial: 37.7 mmHg (ref 32.0–48.0)
pH, Arterial: 7.304 — ABNORMAL LOW (ref 7.350–7.450)
pO2, Arterial: 148 mmHg — ABNORMAL HIGH (ref 83.0–108.0)

## 2017-08-31 LAB — COMPREHENSIVE METABOLIC PANEL
ALBUMIN: 3.6 g/dL (ref 3.5–5.0)
ALT: 122 U/L — ABNORMAL HIGH (ref 17–63)
ANION GAP: 20 — AB (ref 5–15)
AST: 163 U/L — ABNORMAL HIGH (ref 15–41)
Alkaline Phosphatase: 109 U/L (ref 38–126)
BILIRUBIN TOTAL: 2.7 mg/dL — AB (ref 0.3–1.2)
BUN: 39 mg/dL — ABNORMAL HIGH (ref 6–20)
CO2: 14 mmol/L — ABNORMAL LOW (ref 22–32)
Calcium: 8.5 mg/dL — ABNORMAL LOW (ref 8.9–10.3)
Chloride: 102 mmol/L (ref 101–111)
Creatinine, Ser: 1.92 mg/dL — ABNORMAL HIGH (ref 0.61–1.24)
GFR calc Af Amer: 35 mL/min — ABNORMAL LOW (ref >60)
GFR, EST NON AFRICAN AMERICAN: 30 mL/min — AB (ref >60)
Glucose, Bld: 228 mg/dL — ABNORMAL HIGH (ref 65–99)
POTASSIUM: 5.2 mmol/L — AB (ref 3.5–5.1)
Sodium: 136 mmol/L (ref 135–145)
TOTAL PROTEIN: 6.1 g/dL — AB (ref 6.5–8.1)

## 2017-08-31 LAB — TROPONIN I: Troponin I: 0.05 ng/mL (ref <0.03)

## 2017-08-31 LAB — CBC WITH DIFFERENTIAL/PLATELET
BASOS PCT: 0 %
Basophils Absolute: 0 10*3/uL (ref 0.0–0.1)
EOS PCT: 0 %
Eosinophils Absolute: 0 10*3/uL (ref 0.0–0.7)
HEMATOCRIT: 49.4 % (ref 39.0–52.0)
Hemoglobin: 16.6 g/dL (ref 13.0–17.0)
Lymphocytes Relative: 11 %
Lymphs Abs: 1.3 10*3/uL (ref 0.7–4.0)
MCH: 31.6 pg (ref 26.0–34.0)
MCHC: 33.6 g/dL (ref 30.0–36.0)
MCV: 94.1 fL (ref 78.0–100.0)
MONO ABS: 1.1 10*3/uL — AB (ref 0.1–1.0)
MONOS PCT: 9 %
NEUTROS ABS: 9.4 10*3/uL — AB (ref 1.7–7.7)
Neutrophils Relative %: 80 %
Platelets: 140 10*3/uL — ABNORMAL LOW (ref 150–400)
RBC: 5.25 MIL/uL (ref 4.22–5.81)
RDW: 13.5 % (ref 11.5–15.5)
WBC: 11.7 10*3/uL — ABNORMAL HIGH (ref 4.0–10.5)

## 2017-08-31 LAB — PROTIME-INR
INR: 1.82
PROTHROMBIN TIME: 20.9 s — AB (ref 11.4–15.2)

## 2017-08-31 LAB — GLUCOSE, CAPILLARY: Glucose-Capillary: 134 mg/dL — ABNORMAL HIGH (ref 65–99)

## 2017-08-31 MED ORDER — INSULIN ASPART 100 UNIT/ML ~~LOC~~ SOLN
0.0000 [IU] | SUBCUTANEOUS | Status: DC
  Administered 2017-08-31: 1 [IU] via SUBCUTANEOUS

## 2017-08-31 MED ORDER — ACETAMINOPHEN 325 MG PO TABS: 650.0000 mg | ORAL_TABLET | ORAL | Status: DC | PRN

## 2017-08-31 MED ORDER — SODIUM CHLORIDE 0.9% FLUSH
3.0000 mL | Freq: Two times a day (BID) | INTRAVENOUS | Status: DC
  Administered 2017-09-01 – 2017-09-06 (×3): 3 mL via INTRAVENOUS

## 2017-08-31 MED ORDER — ASPIRIN EC 81 MG PO TBEC
81.0000 mg | DELAYED_RELEASE_TABLET | Freq: Every day | ORAL | Status: DC
  Administered 2017-09-02 – 2017-09-07 (×6): 81 mg via ORAL
  Filled 2017-08-31 (×6): qty 1

## 2017-08-31 MED ORDER — SODIUM CHLORIDE 0.9% FLUSH: 3.0000 mL | INTRAVENOUS | Status: DC | PRN

## 2017-08-31 MED ORDER — HEPARIN (PORCINE) IN NACL 100-0.45 UNIT/ML-% IJ SOLN
1000.0000 [IU]/h | INTRAMUSCULAR | Status: DC
  Filled 2017-08-31 (×2): qty 250

## 2017-08-31 MED ORDER — SODIUM CHLORIDE 0.9 % IV SOLN: 250.0000 mL | INTRAVENOUS | Status: DC | PRN

## 2017-08-31 MED ORDER — ONDANSETRON HCL 4 MG/2ML IJ SOLN: 4.0000 mg | Freq: Four times a day (QID) | INTRAMUSCULAR | Status: DC | PRN

## 2017-08-31 NOTE — H&P (Signed)
History and Physical    Bernard Kim IOE:[id number] DOB: 1930-11-19 DOA: 08/31/2017  PCP: Marco Collie, MD  Patient coming from:  [facility]  Chief Complaint:  sob  HPI: Bernard Kim is a 83 y.o. male with medical history significant of CAD, HTN, remote PCI with new EF of 15% at [place], [place] no permanent defects comes in from [facility] as transfer per request of cardiology for new EF of 15% and possible anginal equivalent with sob for consideration of heart cath.  Pt is on bipap for occassional persisent apneic spells.  Per family for the last 2 days previous to admission he has been very sob which has progressively been worsening for the last month.  No fevers.  Having a lot of trouble sleeping at night.  No n/v.  No swelling.  Snores a lot at night for many years.  No previous h/o CHF.  Both cardiology and pulm were called for consult prior to transfer and recommended by cardiology for heart cath.  He denies any chest pain.  His normal wt is 178 lbs per wife.    This is additional info from dr ghimire at [facility] "Patient is a 83 year old M with prior history of bladder cancer, diabetes, dyslipidemia, CAD status post remote PCI, CVA with no residual focal neurological deficits-presented to the ED with 1 month history of exertional dyspnea, found to have pulmonary embolism, probable mild decompensated systolic heart failure and atrial tachycardia vs sinus tachycardia with PACs. He was admitted to the hospitalist service, started on therapeutic Lovenox-and started on beta-blockers, overnight he started having episodes of shortness of breath requiring BiPAP support. This morning, repeat EKG showed atrial fibrillation, cardiology noted subtle ST changes in the inferior leads, which disappeared on subsequent repeat the EKG which now showed junctional tachycardia. Echocardiogram showed EF of around 15-20% (prior 40-45%), subsequently seen by Cardiology-with concern for probable unstable angina with new onset  systolic heart failure, cardiology recommended that we transfer this patient to a tertiary care center for possible left heart catheterization. See below for further details.  (Note-since patient was having almost 1 month history of worsening exertional dyspnea-he was seen by Cardiology as outpatient prior to this hospitalization-per family-he had a echocardiogram-and was told that his EF was low, he also had a Holter monitor for 48 hr.)"   Review of Systems: As per HPI otherwise 10 point review of systems negative.   No past medical history on file.  As above  No past surgical history on file. as above   has an unknown smoking status. he has never used smokeless tobacco. His alcohol and drug histories are not on file. neg x 3  No Known Allergies  No family history on file. no premature CAD  Prior to Admission medications   Medication Sig Start Date End Date Taking? Authorizing Provider  aspirin EC 81 MG tablet Take 81 mg by mouth.    [provider]  clopidogrel (PLAVIX) 75 MG tablet Take 75 mg by mouth.    [provider]  docusate sodium (COLACE) 100 MG capsule Take 100 mg by mouth. 02/12/16   [provider]  Fluocinolone Acetonide 0.01 % OIL as needed.  07/12/11   [provider]  HYDROcodone-acetaminophen (NORCO/VICODIN) 5-325 MG tablet Take by mouth. 02/12/16   [provider]  Insulin Glargine (LANTUS SOLOSTAR) 100 UNIT/ML Solostar Pen  10/14/14   [provider]  insulin lispro (HUMALOG) 100 UNIT/ML KiwkPen Inject into the skin.    [provider]  Insulin Pen Needle (BD PEN NEEDLE NANO U/F) 32G X 4 MM MISC USE 1 DAILY 12/10/15   [provider]  losartan (COZAAR) 25 MG tablet Take 25 mg by mouth.    [provider]  lovastatin (MEVACOR) 40 MG tablet TK 1 T PO QHS 10/02/16   [provider]  lovastatin (MEVACOR) 40 MG tablet Take 40 mg by mouth.    [provider]  metoprolol tartrate  (LOPRESSOR) 25 MG tablet  07/11/14   [provider]  nitroGLYCERIN (NITROSTAT) 0.4 MG SL tablet  03/28/15   [provider]  pneumococcal 13-valent conjugate vaccine (PREVNAR 13) SUSP injection inject 0.5 milliliter intramuscularly 09/06/15   [provider]  sitaGLIPtin-metformin (JANUMET) 50-1000 MG tablet Take by mouth.    [provider]  triamcinolone cream (KENALOG) 0.1 %  11/23/14   [provider]    Physical Exam: Vitals:   08/31/17 1800 08/31/17 1814  BP:  124/89  Pulse: 76 76  Resp: 20 (!) 30  Temp:  (!) 97.5 F (36.4 C)  TempSrc:  Axillary  SpO2: 100% 100%  Weight:  80.7 kg (177 lb 14.6 oz)      Constitutional: NAD, calm, comfortable Vitals:   08/31/17 1800 08/31/17 1814  BP:  124/89  Pulse: 76 76  Resp: 20 (!) 30  Temp:  (!) 97.5 F (36.4 C)  TempSrc:  Axillary  SpO2: 100% 100%  Weight:  80.7 kg (177 lb 14.6 oz)   Eyes: PERRL, lids and conjunctivae normal ENMT: Mucous membranes are moist. Posterior pharynx clear of any exudate or lesions.Normal dentition.  Neck: normal, supple, no masses, no thyromegaly Respiratory: clear to auscultation bilaterally, no wheezing, no crackles. Normal respiratory effort. No accessory muscle use.  Cardiovascular: Regular rate and rhythm, no murmurs / rubs / gallops. No extremity edema. 2+ pedal pulses. No carotid bruits.  Abdomen: no tenderness, no masses palpated. No hepatosplenomegaly. Bowel sounds positive.  Musculoskeletal: no clubbing / cyanosis. No joint deformity upper and lower extremities. Good ROM, no contractures. Normal muscle tone.  Skin: no rashes, lesions, ulcers. No induration Neurologic: CN 2-12 grossly intact. Sensation intact, DTR normal. Strength 5/5 in all 4.  Psychiatric: Normal judgment and insight. Alert and oriented x 3. Normal mood.    Labs on Admission: I have personally reviewed following labs and imaging studies  CBC: No results for input(s): WBC,  NEUTROABS, HGB, HCT, MCV, PLT in the last 168 hours. Basic Metabolic Panel: No results for input(s): NA, K, CL, CO2, GLUCOSE, BUN, CREATININE, CALCIUM, MG, PHOS in the last 168 hours. GFR: CrCl cannot be calculated (No order found.). Liver Function Tests: No results for input(s): AST, ALT, ALKPHOS, BILITOT, PROT, ALBUMIN in the last 168 hours. No results for input(s): LIPASE, AMYLASE in the last 168 hours. No results for input(s): AMMONIA in the last 168 hours. Coagulation Profile: No results for input(s): INR, PROTIME in the last 168 hours. Cardiac Enzymes: No results for input(s): CKTOTAL, CKMB, CKMBINDEX, TROPONINI in the last 168 hours. BNP (last 3 results) No results for input(s): PROBNP in the last 8760 hours. HbA1C: No results for input(s): HGBA1C in the last 72 hours. CBG: No results for input(s): GLUCAP in the last 168 hours. Lipid Profile: No results for input(s): CHOL, HDL, LDLCALC, TRIG, CHOLHDL, LDLDIRECT in the last 72 hours. Thyroid Function Tests: No results for input(s): TSH, T4TOTAL, FREET4, T3FREE, THYROIDAB in the last 72 hours. Anemia Panel: No results for input(s): VITAMINB12, FOLATE, FERRITIN, TIBC, IRON, RETICCTPCT in  the last 72 hours. Urine analysis: No results found for: COLORURINE, APPEARANCEUR, LABSPEC, PHURINE, GLUCOSEU, HGBUR, BILIRUBINUR, KETONESUR, PROTEINUR, UROBILINOGEN, NITRITE, LEUKOCYTESUR Sepsis Labs: !!!!!!!!!!!!!!!!!!!!!!!!!!!!!!!!!!!!!!!!!!!! @LABRCNTIP (procalcitonin:4,lacticidven:4) )No results found for this or any previous visit (from the past 240 hour(s)).   Radiological Exams on Admission: No results found.  EKG: pending Old records reviewed  Case discussed with dr ghimire   Assessment/Plan 83 yo male with acute hypoxic resp failure/apneic spells with new onset chf EF 15% with possible anginal equivalent\ Principal Problem:   Acute respiratory failure with hypoxia ([place])- unsure if apneic spells related to heart or possible  underlying OSA.  Cont bipap.  Consult PCCM.  Pt is full code.  He will likely need bipap through the night.    Active Problems:   Acute CHF (congestive heart failure) ([place])- has been getting lasix at [facility].  Had some aki there.  Ck labs first before further management decided.  Cards consulted and notifed of need for LHC   Pulmonary embolism ([place])- very small and does not explain a cause for his apneic spells.  Hep drip   Apneic episode- as above   Coronary artery disease- ck troponin   Diabetes mellitus, type 2 ([place])- ssi   Essential hypertension- noted   Non-rheumatic mitral regurgitation- noted   Dyspnea- as above.  Unclear etiology if all heart related, not volume overloaded   Paroxysmal A-fib (HCC)- stable rate , cont monitor   Ck cmp, trop, bnp, cbc, ekg   DVT prophylaxis:  Hep drip Code Status:  Full code but not for long term Family Communication:  Wife, daughter, son, granddtr who is Therapist, sports at [employer] Disposition Plan:  Per day team Consults called:  PCCM, cardiology Admission status:  admit   Jerica Creegan A MD [facility]  If 7PM-7AM, please contact night-coverage [email or web address] Password [email or web address]  08/31/2017, 7:25 PM

## 2017-08-31 NOTE — Progress Notes (Signed)
ANTICOAGULATION CONSULT NOTE - Initial Consult  Pharmacy Consult for Heparin Indication: pulmonary embolus  No Known Allergies  Patient Measurements: Weight: 177 lb 14.6 oz (80.7 kg)   Vital Signs: Temp: 97.5 F (36.4 C) (12/17 1814) Temp Source: Axillary (12/17 1814) BP: 124/89 (12/17 1814) Pulse Rate: 76 (12/17 1814)  Assessment: 83 year old male transferred from [facility] with CHF and Pulmonary embolus, on therapeutic Lovenox at 85 mg sq Q 12 starting 12/16, transitioned to IV heparin prior to transfer today at 5 pm in anticipation of cardiac cath  Given heparin bolus of 2500 units with heparin drip starting at 1100 units / hr at approximately 5 pm   Goal of Therapy:  Heparin level 0.3-0.7 units/ml Monitor platelets by anticoagulation protocol: Yes   Plan:  Continue heparin at 1100 units / hr  Heparin level at 1 am tonight Daily heparin level, CBC  Thank you Anette Guarneri, PharmD [phone]  08/31/2017,7:43 PM

## 2017-08-31 NOTE — Progress Notes (Addendum)
Patient arrived the unit from [facility], vital signs obtained see flowsheet, placed on monitor , ccmd notified, patient oriented to room and staff, bed in lowest position, call bell in reach, patient on bi pap,patient having periods of apnea with increased work of braething,RT at bedside, MD notified, will continue to monitor.

## 2017-08-31 NOTE — Consult Note (Signed)
Cardiology Consultation:   Patient ID: Bernard Kim; [id number]; 1931/06/26   Admit date: 08/31/2017 Date of Consult: 08/31/2017  Primary Care Provider: Marco Collie, MD Primary Cardiologist:  [facility]/ Also started  [facility]. Primary Electrophysiologist:  None   Patient Profile:   Bernard Kim is a 83 y.o. male with a hx of  Ischemic cardiomyopathy who is being seen today for the evaluation OF  New onset Systolic  Acute  Reduction in EF  To 15%,  PAF  And  NYHA  Class II  Stage C  Heart  Failure, transfeered  From /[place] hospital   History of Present Illness:    Patient is a 83 year old M with prior history of bladder cancer, diabetes, dyslipidemia, CAD status post remote PCI, CVA with no residual focal neurological deficits-presented to the ED with 1 month history of exertional dyspnea, found to have pulmonary embolism, probable mild decompensated systolic heart failure and atrial tachycardia vs sinus tachycardia with PACs. He  Was noted to have  Right SUB SEGMENTAL Small PE  At  12/17 [facility]  , but  His  Dyspnea  Was  Out  Of  proportuionate to the  Size  Of PE, subsequently  Had an ABNL , EKG, PAF  And  Was noted to have Cardiomyopathy at EF  15%.  He was admitted to the hospitalist service, started on therapeutic Lovenox/switched to hEP GTT AT  [place], -and started on beta-blockers, overnight he started having episodes of shortness of breath requiring BiPAP support.   This morning, repeat EKG showed atrial fibrillation, cardiology at [facility] noted subtle ST ELEVATION,  changes in the inferior leads, which disappeared on subsequent repeat the EKG which now showed junctional tachycardia. E  echocardiogram showed EF of around 15-20% (prior 40-45%), Severe MR .  subsequently seen by Cardiology-with concern for probable unstable angina with new onset systolic heart failure, cardiology  recommended that we transfer this patient to a tertiary care center for possible left heart catheterization. See below for further details.  (Note-since patient was having almost 1 month history of worsening exertional dyspnea-he was seen by Cardiology as outpatient prior to this hospitalization-per family-he had a echocardiogram-and was told that his EF was low,    Home Medications:  Prior to Admission medications   Medication Sig Start Date End Date Taking? Authorizing Provider  acetaminophen (TYLENOL 8 HOUR ARTHRITIS PAIN) 650 MG CR tablet Take 650 mg by mouth every 8 (eight) hours as needed for pain.   Yes [provider]  clopidogrel (PLAVIX) 75 MG tablet Take 75 mg by mouth.   Yes [provider]  Insulin Glargine (LANTUS SOLOSTAR) 100 UNIT/ML Solostar Pen Inject 10 Units into the skin daily at 10 pm.  10/14/14  Yes [provider]  losartan (COZAAR) 25 MG tablet Take 25 mg by mouth.   Yes [provider]  lovastatin (MEVACOR) 40 MG tablet Take 40 mg by mouth.   Yes [provider]  metoprolol tartrate (LOPRESSOR) 25 MG tablet Take 12.5 mg by mouth daily.  07/11/14  Yes [provider]  nitroGLYCERIN (NITROSTAT) 0.4 MG SL tablet  03/28/15  Yes [provider]  sitaGLIPtin-metformin (JANUMET) 50-1000 MG tablet Take 1 tablet by mouth daily.    Yes [provider]  aspirin EC 81 MG tablet Take 81 mg by mouth.    [provider]  docusate sodium (COLACE) 100 MG capsule  Take 100 mg by mouth. 02/12/16   [provider]  Fluocinolone Acetonide 0.01 % OIL as needed.  07/12/11   [provider]  HYDROcodone-acetaminophen (NORCO/VICODIN) 5-325 MG tablet Take by mouth. 02/12/16   [provider]  insulin lispro (HUMALOG) 100 UNIT/ML KiwkPen Inject into the skin.    [provider]  pneumococcal 13-valent conjugate vaccine (PREVNAR 13) SUSP injection inject 0.5 milliliter intramuscularly  09/06/15   [provider]  triamcinolone cream (KENALOG) 0.1 %  11/23/14   [provider]    Inpatient Medications: Scheduled Meds: . aspirin EC  81 mg Oral Daily  . insulin aspart  0-9 Units Subcutaneous Q4H  . sodium chloride flush  3 mL Intravenous Q12H   Continuous Infusions: . sodium chloride    . heparin     PRN Meds: sodium chloride, acetaminophen, ondansetron (ZOFRAN) IV, sodium chloride flush  Allergies:   No Known Allergies  Social History:   Social History   Socioeconomic History  . Marital status: Single    Spouse name: Not on file  . Number of children: Not on file  . Years of education: Not on file  . Highest education level: Not on file  Social Needs  . Financial resource strain: Not on file  . Food insecurity - worry: Not on file  . Food insecurity - inability: Not on file  . Transportation needs - medical: Not on file  . Transportation needs - non-medical: Not on file  Occupational History  . Not on file  Tobacco Use  . Smoking status: Unknown If Ever Smoked  . Smokeless tobacco: Never Used  Substance and Sexual Activity  . Alcohol use: Not on file  . Drug use: Not on file  . Sexual activity: Not on file  Other Topics Concern  . Not on file  Social History Narrative  . Not on file    Family History:   CAD, HTN  ROS:  Please see the history of present illness.  ROS  All other ROS reviewed and negative.     Physical Exam/Data:   Vitals:   08/31/17 1800 08/31/17 1814 08/31/17 1956  BP:  124/89 112/79  Pulse: 76 76 89  Resp: 20 (!) 30 20  Temp:  (!) 97.5 F (36.4 C) (!) 96.5 F (35.8 C)  TempSrc:  Axillary Axillary  SpO2: 100% 100% 100%  Weight:  177 lb 14.6 oz (80.7 kg)    No intake or output data in the 24 hours ending 08/31/17 2219 Filed Weights   08/31/17 1814  Weight: 177 lb 14.6 oz (80.7 kg)   There is no height or weight on file to calculate BMI.  General:  Well nourished, well developed, , SLIGHT   RESPIRATORY  Distress, tachypnea. HEENT: normal Lymph: no adenopathy Neck: no JVD Endocrine:  No thryomegaly Vascular: No carotid bruits; FA pulses 2+ bilaterally without bruits  Cardiac:irregular pulse, Increase in JVD.  ; RRR;   MR  Murmur,  Lungs:  , rhonchi or rales b/l Abd: soft, nontender, no hepatomegaly  Ext: no edema Musculoskeletal:  No deformities, BUE and BLE strength normal and equal Skin: warm and dry , 1+ EDEMA. Neuro:  CNs 2-12 intact, no focal abnormalities noted Psych:  Normal affect   EKG:  The EKG was personally reviewed and demonstrates:  Jnl  Rhythm , PAF, earlier EKG  At [place]  Reviewed, Subtle  Inferior  ST  Elevation upto 1 mm in III , Avf, which  Slightly resolved ,  with persisiting  ST  Diffuse  Depressions,   Telemetry:  Telemetry was personally reviewed and demonstrates:   Accelerated  Junctional rhythm , PAF,  Relevant CV Studies: ECHO  At [facility]  EF  15% Severe MR Moderate TR   Laboratory Data:  Chemistry Recent Labs  Lab 08/31/17 1934  NA 136  K 5.2*  CL 102  CO2 14*  GLUCOSE 228*  BUN 39*  CREATININE 1.92*  CALCIUM 8.5*  GFRNONAA 30*  GFRAA 35*  ANIONGAP 20*    Recent Labs  Lab 08/31/17 1934  PROT 6.1*  ALBUMIN 3.6  AST 163*  ALT 122*  ALKPHOS 109  BILITOT 2.7*   Hematology Recent Labs  Lab 08/31/17 1934  WBC 11.7*  RBC 5.25  HGB 16.6  HCT 49.4  MCV 94.1  MCH 31.6  MCHC 33.6  RDW 13.5  PLT 140*   Cardiac Enzymes Recent Labs  Lab 08/31/17 1934  TROPONINI 0.05*   No results for input(s): TROPIPOC in the last 168 hours.  BNP Recent Labs  Lab 08/31/17 1934  BNP 586.5*    DDimer No results for input(s): DDIMER in the last 168 hours.  Radiology/Studies:  No results found.  Assessment and Plan:   1. Acute on chronic  Systolic  Heart  Failure-  EF  15% echo at [place]  , notes, echo  EKG  At  [facility] , ST  Repeat EKG- resolved, No chest  Pain ,  Prior  Known EF  40% Pt of Dr  Bettina Gavia , 1996  CAD and PCI . Cautious diuretics based on GFR,  Med  Supportive  Mgmt , when more  Stable, review NPO- LHC/PCI  Due to elevated  Enzymes, EF15%,  And Recent  ekg  Changes    2.  Atrial fibrillation:  Amio gtt, Hep gtt, paroxysmal CHADS2 VASC  >3,  RATE  CONTROLLED  Heparin gtt    3. Pulmonary Embolism:  Small  Right  Segmental-  Hep gtt  4.Respiratory failure:    Bipap , ICU conult,  DIURETICS  LASIX 40 mg  IV Breathing treatment     For questions or updates, please contact [place] Please consult [email or web address] for contact info under Cardiology/STEMI.   Signed, Johna Sheriff, MD  08/31/2017 10:19 PM        DVT prophylaxis:  Hep drip Code Status:  Full code but not for long term Family Communication:  Wife, daughter, son, granddtr who is Therapist, sports at [employer] Disposition Plan:  Per day team Consults called:  PCCM, cardiology Admission status:  admit

## 2017-08-31 NOTE — Progress Notes (Signed)
CRITICAL VALUE ALERT  Critical Value: Troponin 0.05  Date & Time Notied:  08/31/17 21:15  Provider Notified: K.Kirby Paged Orders Received/Actions taken:  No new orders received.

## 2017-09-01 ENCOUNTER — Ambulatory Visit (HOSPITAL_COMMUNITY): Admission: RE | Admit: 2017-09-01 | Payer: Medicare Other | Source: Ambulatory Visit | Admitting: Cardiovascular Disease

## 2017-09-01 ENCOUNTER — Inpatient Hospital Stay (HOSPITAL_COMMUNITY): Payer: Medicare Other

## 2017-09-01 ENCOUNTER — Encounter (HOSPITAL_COMMUNITY)
Admission: AD | Disposition: A | Discharge status: Home Health | Payer: Self-pay | Source: Other Acute Inpatient Hospital | Attending: Internal Medicine

## 2017-09-01 DIAGNOSIS — I5041 Acute combined systolic (congestive) and diastolic (congestive) heart failure: Secondary | ICD-10-CM

## 2017-09-01 DIAGNOSIS — E872 Acidosis: Secondary | ICD-10-CM

## 2017-09-01 DIAGNOSIS — J810 Acute pulmonary edema: Secondary | ICD-10-CM

## 2017-09-01 DIAGNOSIS — J90 Pleural effusion, not elsewhere classified: Secondary | ICD-10-CM

## 2017-09-01 DIAGNOSIS — R945 Abnormal results of liver function studies: Secondary | ICD-10-CM

## 2017-09-01 DIAGNOSIS — I5023 Acute on chronic systolic (congestive) heart failure: Secondary | ICD-10-CM

## 2017-09-01 DIAGNOSIS — J9601 Acute respiratory failure with hypoxia: Secondary | ICD-10-CM

## 2017-09-01 DIAGNOSIS — I10 Essential (primary) hypertension: Secondary | ICD-10-CM

## 2017-09-01 DIAGNOSIS — I4891 Unspecified atrial fibrillation: Secondary | ICD-10-CM

## 2017-09-01 DIAGNOSIS — I2699 Other pulmonary embolism without acute cor pulmonale: Secondary | ICD-10-CM

## 2017-09-01 DIAGNOSIS — I2510 Atherosclerotic heart disease of native coronary artery without angina pectoris: Secondary | ICD-10-CM

## 2017-09-01 LAB — GLUCOSE, CAPILLARY
Glucose-Capillary: 117 mg/dL — ABNORMAL HIGH (ref 65–99)
Glucose-Capillary: 121 mg/dL — ABNORMAL HIGH (ref 65–99)
Glucose-Capillary: 151 mg/dL — ABNORMAL HIGH (ref 65–99)
Glucose-Capillary: 164 mg/dL — ABNORMAL HIGH (ref 65–99)
Glucose-Capillary: 167 mg/dL — ABNORMAL HIGH (ref 65–99)
Glucose-Capillary: 232 mg/dL — ABNORMAL HIGH (ref 65–99)

## 2017-09-01 LAB — BASIC METABOLIC PANEL
ANION GAP: 21 — AB (ref 5–15)
BUN: 48 mg/dL — ABNORMAL HIGH (ref 6–20)
CALCIUM: 8.3 mg/dL — AB (ref 8.9–10.3)
CO2: 15 mmol/L — ABNORMAL LOW (ref 22–32)
Chloride: 102 mmol/L (ref 101–111)
Creatinine, Ser: 2.29 mg/dL — ABNORMAL HIGH (ref 0.61–1.24)
GFR, EST AFRICAN AMERICAN: 28 mL/min — AB (ref >60)
GFR, EST NON AFRICAN AMERICAN: 25 mL/min — AB (ref >60)
Glucose, Bld: 233 mg/dL — ABNORMAL HIGH (ref 65–99)
Potassium: 4.9 mmol/L (ref 3.5–5.1)
Sodium: 138 mmol/L (ref 135–145)

## 2017-09-01 LAB — HEPATIC FUNCTION PANEL
ALBUMIN: 3.5 g/dL (ref 3.5–5.0)
ALK PHOS: 108 U/L (ref 38–126)
ALT: 438 U/L — ABNORMAL HIGH (ref 17–63)
AST: 742 U/L — ABNORMAL HIGH (ref 15–41)
BILIRUBIN DIRECT: 0.5 mg/dL (ref 0.1–0.5)
BILIRUBIN INDIRECT: 1.3 mg/dL — AB (ref 0.3–0.9)
BILIRUBIN TOTAL: 1.8 mg/dL — AB (ref 0.3–1.2)
Total Protein: 6 g/dL — ABNORMAL LOW (ref 6.5–8.1)

## 2017-09-01 LAB — EXPECTORATED SPUTUM ASSESSMENT W GRAM STAIN, RFLX TO RESP C

## 2017-09-01 LAB — EXPECTORATED SPUTUM ASSESSMENT W REFEX TO RESP CULTURE

## 2017-09-01 LAB — TROPONIN I
TROPONIN I: 0.05 ng/mL — AB (ref <0.03)
TROPONIN I: 0.04 ng/mL — AB (ref <0.03)

## 2017-09-01 LAB — LACTIC ACID, PLASMA
LACTIC ACID, VENOUS: 2.7 mmol/L — AB (ref 0.5–1.9)
Lactic Acid, Venous: 2.7 mmol/L (ref 0.5–1.9)

## 2017-09-01 LAB — HEPARIN LEVEL (UNFRACTIONATED)
HEPARIN UNFRACTIONATED: 1.3 [IU]/mL — AB (ref 0.30–0.70)
Heparin Unfractionated: 1.02 IU/mL — ABNORMAL HIGH (ref 0.30–0.70)
Heparin Unfractionated: 0.81 IU/mL — ABNORMAL HIGH (ref 0.30–0.70)

## 2017-09-01 LAB — PROTIME-INR
INR: 1.97
Prothrombin Time: 22.3 seconds — ABNORMAL HIGH (ref 11.4–15.2)

## 2017-09-01 SURGERY — LEFT HEART CATH AND CORONARY ANGIOGRAPHY
Anesthesia: LOCAL

## 2017-09-01 MED ORDER — AMIODARONE LOAD VIA INFUSION: 150.0000 mg | Freq: Once | INTRAVENOUS | Status: DC

## 2017-09-01 MED ORDER — AMIODARONE HCL IN DEXTROSE 360-4.14 MG/200ML-% IV SOLN
30.0000 mg/h | INTRAVENOUS | Status: DC
  Administered 2017-09-01 – 2017-09-06 (×10): 30 mg/h via INTRAVENOUS
  Filled 2017-09-01 (×13): qty 200

## 2017-09-01 MED ORDER — ACETAMINOPHEN 325 MG PO TABS: 325.0000 mg | ORAL_TABLET | Freq: Four times a day (QID) | ORAL | Status: DC | PRN

## 2017-09-01 MED ORDER — FUROSEMIDE 10 MG/ML IJ SOLN
40.0000 mg | Freq: Two times a day (BID) | INTRAMUSCULAR | Status: DC
  Administered 2017-09-01 – 2017-09-03 (×4): 40 mg via INTRAVENOUS
  Filled 2017-09-01 (×4): qty 4

## 2017-09-01 MED ORDER — INSULIN ASPART 100 UNIT/ML ~~LOC~~ SOLN
0.0000 [IU] | Freq: Three times a day (TID) | SUBCUTANEOUS | Status: DC
  Administered 2017-09-01: 5 [IU] via SUBCUTANEOUS
  Administered 2017-09-02: 1 [IU] via SUBCUTANEOUS
  Administered 2017-09-02 – 2017-09-03 (×2): 2 [IU] via SUBCUTANEOUS
  Administered 2017-09-03: 1 [IU] via SUBCUTANEOUS
  Administered 2017-09-04 (×2): 2 [IU] via SUBCUTANEOUS
  Administered 2017-09-04: 1 [IU] via SUBCUTANEOUS
  Administered 2017-09-05: 3 [IU] via SUBCUTANEOUS
  Administered 2017-09-05: 2 [IU] via SUBCUTANEOUS
  Administered 2017-09-06 (×2): 1 [IU] via SUBCUTANEOUS
  Administered 2017-09-06: 2 [IU] via SUBCUTANEOUS
  Administered 2017-09-07: 1 [IU] via SUBCUTANEOUS

## 2017-09-01 MED ORDER — INSULIN ASPART 100 UNIT/ML ~~LOC~~ SOLN
0.0000 [IU] | Freq: Every day | SUBCUTANEOUS | Status: DC
  Administered 2017-09-03: 2 [IU] via SUBCUTANEOUS
  Administered 2017-09-05: 3 [IU] via SUBCUTANEOUS

## 2017-09-01 MED ORDER — AMIODARONE HCL IN DEXTROSE 360-4.14 MG/200ML-% IV SOLN
INTRAVENOUS | Status: AC
  Administered 2017-09-01: 30 mg/h via INTRAVENOUS
  Filled 2017-09-01: qty 200

## 2017-09-01 NOTE — Progress Notes (Addendum)
Progress Note  Patient Name: Bernard Kim Date of Encounter: 09/01/2017  Primary [facility]ologist: [facility]/ Also started  [facility] Otho [facility] [place] [facility]  Subjective   Feeling better this am.  Now on BiPAP but laying flat.  Has had several weeks of progressive SOB but no CP.  Also has noticed his heart racing for several weeks as well.  Dx with small PE at outside hospital and echo with EF 10%.  Started on Amio gtt and now rate controlled.  Inpatient Medications    Scheduled Meds: . aspirin EC  81 mg Oral Daily  . insulin aspart  0-9 Units Subcutaneous Q4H  . sodium chloride flush  3 mL Intravenous Q12H   Continuous Infusions: . sodium chloride    . amiodarone 30 mg/hr (09/01/17 0335)  . heparin 950 Units/hr (09/01/17 0757)   PRN Meds: sodium chloride, acetaminophen, ondansetron (ZOFRAN) IV, sodium chloride flush   Vital Signs    Vitals:   09/01/17 0415 09/01/17 0600 09/01/17 0644 09/01/17 0909  BP: 106/71 (!) 129/98  131/90  Pulse:    (!) 45  Resp: (!) 32 (!) 30    Temp:      TempSrc:      SpO2: 100%   100%  Weight:   176 lb 2.4 oz (79.9 kg)   Height:        Intake/Output Summary (Last 24 hours) at 09/01/2017 0944 Last data filed at 09/01/2017 0646 Gross per 24 hour  Intake 116.96 ml  Output 250 ml  Net -133.04 ml   Filed Weights   08/31/17 1814 09/01/17 0644  Weight: 177 lb 14.6 oz (80.7 kg) 176 lb 2.4 oz (79.9 kg)    Telemetry    Atrial fibrillation with CVR - Personally Reviewed  ECG    Atrial fibrillation - Personally Reviewed  Physical Exam   GEN: No acute distress on BIPAP Neck: No JVD Cardiac: irregularly irregular, no murmurs, rubs, or gallops.  Respiratory: Crackles at bases bilaterally GI: Soft, nontender, non-distended  MS: No edema; cool extremities Neuro:  Nonfocal  Psych: Normal affect   Labs    Chemistry Recent Labs  Lab 08/31/17 1934 09/01/17 0251  NA 136 138  K 5.2* 4.9  CL 102  102  CO2 14* 15*  GLUCOSE 228* 233*  BUN 39* 48*  CREATININE 1.92* 2.29*  CALCIUM 8.5* 8.3*  PROT 6.1*  --   ALBUMIN 3.6  --   AST 163*  --   ALT 122*  --   ALKPHOS 109  --   BILITOT 2.7*  --   GFRNONAA 30* 25*  GFRAA 35* 28*  ANIONGAP 20* 21*     Hematology Recent Labs  Lab 08/31/17 1934  WBC 11.7*  RBC 5.25  HGB 16.6  HCT 49.4  MCV 94.1  MCH 31.6  MCHC 33.6  RDW 13.5  PLT 140*    Cardiac Enzymes Recent Labs  Lab 08/31/17 1934 09/01/17 0251  TROPONINI 0.05* 0.05*   No results for input(s): TROPIPOC in the last 168 hours.   BNP Recent Labs  Lab 08/31/17 1934  BNP 586.5*     DDimer No results for input(s): DDIMER in the last 168 hours.   Radiology    No results found.  Cardiac Studies   none  Patient Profile     83 y.o. male with history of CAD with remote PCI, HTN, remote CVA admitted with a 2 week history of progressive SOB and palpitations.  Apparently had gotten the flu shot 6 weeks ago and started having purulent sputum which he has continued to have.  He was admitted to [facility] and found to have a small PE and was tachycardia (initially thought to be atrial tach) and was admitted to [facility] on Lovenox. Had worsening SOB and started on BiPAP and noted to have atrial fib with RVR and started on BB.  Seen by Cards and echo showed EF 10-15% (had been 40-45%).    Assessment & Plan    1.  Acute on chronic systolic CHF - EF in the past had been 40-45% and now 10%.   - unclear of etiology of acute drop in EF.  He was tachycardic on admission and had various arrhythmias documented including ST/atrial tach/junct tach but I suspect this is all afib with RVR and may have a tachy induced DCM.  He has had palpitations for several weeks.  He also had sx of recent URI as well so could be viral in etiology.  - I am going to repeat a 2D echo here now that HR is controlled. - will ask AHF to see patient - would ultimately like to get R/L heart cath but will hold off  for now due to worsening renal function.   - start Lasix 40mg  IV BID and follow BP and renal function closely - Creatinine should improve with diuresis  2.  Atrial fibrillation with RVR - by symptoms likely has gone on for a few weeks - HR controlled on IV Amio gtt - continue IV heparin gtt per pharmacy - may be cause of drop in EF  3.  DCM - EF had been 40-45% and now 10%.  - BB and ARB on hold due to acute decompensation  4.  ASCAD with remote stenting in the past.  He denies any recent CP - continue ASA - restart statin  5.  AKI - unsure of baseline creatinine but on admit 1.92 and now up to 2.29.   - likely due to passive congestion from CHF. - will need to follow closely in setting of diuresis  6.  Elevated INR - LFTs are elevated ? Etiology.  He likely has some passive congestion of his liver and unclear what BPs were running at [place] but could have been hypotensive as well.  He has not been on Coumadin and unlikely to see an INR elevation this high in setting of just IV Heparin or Lovenox.  Repeat LFTs today to make sure they are trending downward.    I have spent a total of 40 minutes with patient reviewing hospital notes , telemetry, EKGs, labs and examining patient as well as establishing an assessment and plan that was discussed with the patient.  > 50% of time was spent in direct patient care.   For questions or updates, please contact [place] Please consult [email or web address] for contact info under Cardiology/STEMI.      Signed, Fransico Him, MD  09/01/2017, 9:44 AM

## 2017-09-01 NOTE — Care Management Note (Signed)
Case Management Note Marvetta Gibbons RN, BSN Unit 4E-Case Manager [phone]  Patient Details  Name: Bernard Kim MRN: [id number] Date of Birth: 25-Mar-1931  Subjective/Objective:    Pt admitted with acute resp. Failure- acute PE, CHF- need for Bipap, IV Amio and IV heparin.                 Action/Plan: PTA Pt lived at home with spouse- has supportive family nearby- CM to follow for transition to recovery needs.   Expected Discharge Date:  09/06/17               Expected Discharge Plan:     In-House Referral:     Discharge planning Services  CM Consult  Post Acute Care Choice:    Choice offered to:     DME Arranged:    DME Agency:     HH Arranged:    HH Agency:     Status of Service:  In process, will continue to follow  If discussed at Long Length of Stay Meetings, dates discussed:    Discharge Disposition:   Additional Comments:  Bernard Patricia, RN 09/01/2017, 10:33 AM

## 2017-09-01 NOTE — Consult Note (Signed)
PULMONARY / CRITICAL CARE MEDICINE   Name: Bernard Kim MRN: [id number] DOB: October 01, 1930    ADMISSION DATE:  08/31/2017 CONSULTATION DATE: 09/01/2017  REFERRING MD: [facility]  CHIEF COMPLAINT: Dyspnea on exertion, no sputum production.  HISTORY OF PRESENT ILLNESS:   Mr. Bernard Kim is an 83 year old male with a significant past medical history that includes but not limited to prostate cancer, bladder cancer treated with BCG live, ejection fraction noted to be 15%, coronary artery disease with a right RCA stent, diabetes mellitus, hypertension, new onset of atrial fibrillation rapid ventricular response. He was admitted to [facility] initially dyspnea on exertion and required noninvasive mechanical ventilatory support.  To have atrial fibrillation rapid response which is been placed on amiodarone and heparin.  He was transferred to [facility] 08/31/2017 and has remained on noninvasive mechanical ventilatory support for the last 31 hours as of 09/01/2017 at 1000 hrs. P CCM consult of her dyspnea and use of noninvasive mechanical ventilatory support on 09/01/2017. We will try him off noninvasive mechanical ventilatory support.  We will check a chest x-ray for completeness.  Do that he said that her bladder chemotherapy in a purulent sputum for 6 weeks we will pan culture.  PAST MEDICAL HISTORY :  He  has a past medical history of Cancer ([place]), CHF (congestive heart failure) ([place]), Coronary artery disease, Dyspnea, Hypertension, and Stroke ([place]).  PAST SURGICAL HISTORY: He  has a past surgical history that includes Cardiac catheterization.  No Known Allergies  No current facility-administered medications on file prior to encounter.    Current Outpatient Medications on File Prior to Encounter  Medication Sig  . acetaminophen (TYLENOL 8 HOUR ARTHRITIS PAIN) 650 MG CR tablet Take 650 mg by mouth every 8 (eight) hours as needed for pain.  Marland Kitchen clopidogrel (PLAVIX) 75 MG tablet Take 75  mg by mouth.  . Insulin Glargine (LANTUS SOLOSTAR) 100 UNIT/ML Solostar Pen Inject 10 Units into the skin daily at 10 pm.   . losartan (COZAAR) 25 MG tablet Take 25 mg by mouth.  . lovastatin (MEVACOR) 40 MG tablet Take 40 mg by mouth.  . metoprolol tartrate (LOPRESSOR) 25 MG tablet Take 12.5 mg by mouth daily.   . nitroGLYCERIN (NITROSTAT) 0.4 MG SL tablet   . sitaGLIPtin-metformin (JANUMET) 50-1000 MG tablet Take 1 tablet by mouth daily.   Marland Kitchen aspirin EC 81 MG tablet Take 81 mg by mouth.  . docusate sodium (COLACE) 100 MG capsule Take 100 mg by mouth.  . Fluocinolone Acetonide 0.01 % OIL as needed.   Marland Kitchen HYDROcodone-acetaminophen (NORCO/VICODIN) 5-325 MG tablet Take by mouth.  . insulin lispro (HUMALOG) 100 UNIT/ML KiwkPen Inject into the skin.  . pneumococcal 13-valent conjugate vaccine (PREVNAR 13) SUSP injection inject 0.5 milliliter intramuscularly  . triamcinolone cream (KENALOG) 0.1 %     FAMILY HISTORY:  His has no family status information on file.    SOCIAL HISTORY: He  reports that  has never smoked. he has never used smokeless tobacco. He reports that he does not drink alcohol or use drugs.  REVIEW OF SYSTEMS:   Taken extensively see HPI  SUBJECTIVE:  83 year old male currently on noninvasive mechanical ventilatory support in no acute distress.  VITAL SIGNS: BP 131/90   Pulse (!) 45   Temp (!) 96.5 F (35.8 C) (Axillary)   Resp (!) 30   Ht 5\' 8"  (1.727 m)   Wt 79.9 kg (176 lb 2.4 oz)   SpO2 100%   BMI 26.78 kg/m  HEMODYNAMICS:    VENTILATOR SETTINGS: FiO2 (%):  [40 %] 40 %  INTAKE / OUTPUT:  Intake/Output Summary (Last 24 hours) at 09/01/2017 0956 Last data filed at 09/01/2017 0646 Gross per 24 hour  Intake 116.96 ml  Output 250 ml  Net -133.04 ml    PHYSICAL EXAMINATION: General: 83 year old male who appears younger than stated age.  Currently on noninvasive mechanical ventilatory support but able to communicate without problems. Neuro: Recently  intact follows commands moves all extremities x4 to command HEENT: Currently with full facemask from noninvasive mechanical ventilatory support in place.  No overt JVD appreciated Cardiovascular: Heart sounds are irregular with atrial flutter on his cardiac monitor.  He is on a heparin and amiodarone drip at the time of this dictation. Lungs: Currently on noninvasive mechanical ventilatory support.  Good chest wall excursion.  Mild bibasilar crackles noted. Abdomen: Soft and nontender Musculoskeletal: Intact Skin: Warm dry no peripheral edema noted  LABS:  BMET Recent Labs  Lab 08/31/17 1934 09/01/17 0251  NA 136 138  K 5.2* 4.9  CL 102 102  CO2 14* 15*  BUN 39* 48*  CREATININE 1.92* 2.29*  GLUCOSE 228* 233*    Electrolytes Recent Labs  Lab 08/31/17 1934 09/01/17 0251  CALCIUM 8.5* 8.3*    CBC Recent Labs  Lab 08/31/17 1934  WBC 11.7*  HGB 16.6  HCT 49.4  PLT 140*    Coag's Recent Labs  Lab 08/31/17 1934 09/01/17 0251  INR 1.82 1.97    Sepsis Markers No results for input(s): LATICACIDVEN, PROCALCITON, O2SATVEN in the last 168 hours.  ABG Recent Labs  Lab 08/31/17 1900  PHART 7.304*  PCO2ART 37.7  PO2ART 148*    Liver Enzymes Recent Labs  Lab 08/31/17 1934  AST 163*  ALT 122*  ALKPHOS 109  BILITOT 2.7*  ALBUMIN 3.6    Cardiac Enzymes Recent Labs  Lab 08/31/17 1934 09/01/17 0251 09/01/17 0813  TROPONINI 0.05* 0.05* 0.04*    Glucose Recent Labs  Lab 08/31/17 2023 09/01/17 0017 09/01/17 0415 09/01/17 0848  GLUCAP 134* 121* 117* 151*    Imaging No results found.   STUDIES:  09/01/2017 chest x-ray pending  CULTURES: 09/01/2017 sputum 09/01/2017 blood cultures x2 09/01/2017 urine culture  ANTIBIOTICS: None  SIGNIFICANT EVENTS: 08/31/2017 transferred to [facility] from [facility] 09/01/2017 pulmonary critical care consult  LINES/TUBES: None  DISCUSSION: Mr. Bernard Kim is an 83 year old, never smoker, who reports  yellow sputum for 6 weeks, worsening dyspnea on exertion for 6 weeks, with a known ejection fraction of 15%. Receiving BCG line of chemotherapy for bladder cancer just completed.  Prostate cancer.  He denies fevers chills or sweats. He was at [facility] requiring noninvasive mechanical ventilatory support.  He was transferred to [facility] and remains on noninvasive mechanical ventilatory support for the last 31 hours.  We will try him off BiPAP at this time on 09/01/2017 and monitor his pulmonary status.  ASSESSMENT / PLAN:  PULMONARY A: Acute on chronic respiratory failure in a never smoker. Worsening dyspnea over 6 weeks Sputum times 6 weeks after received flu vaccine. Recent chemotherapy for bladder cancer Ejection fraction 50% P:   09/01/2017 we will try him off BiPAP. Arterial blood gases are consistent with metabolic acidosis with rising creatinine. He is scheduled for heart catheterization.   CARDIOVASCULAR A:  Known cardiomyopathy with EF 15% History of coronary artery disease with RCA stent Atrial flutter P:  Per cardiology  RENAL Lab Results  Component Value Date  CREATININE 2.29 (H) 09/01/2017   CREATININE 1.92 (H) 08/31/2017   Recent Labs  Lab 08/31/17 1934 09/01/17 0251  K 5.2* 4.9    A:   Worsening renal failure P:   Avoid nephrotoxic Questionable if he can tolerate cardiac catheterization.  GASTROINTESTINAL A:   Currently n.p.o. due to plan for left heart catheterization P:   N.p.o. for now  HEMATOLOGIC A:   History of prostate cancer Currently under treatment for bladder cancer lateral wall completed BCG live. Heparin therapy for atrial flutter P:  Heparin per cardiology  INFECTIOUS A:   Chronic purulent sputum times 6 weeks now yellow. 08/31/2017 WBCs 11.7 Temperature 96.1 Recently completed BCG live chemotherapy for bladder cancer P:   09/01/2017 check sputum culture Blood cultures x2 on  09/01/2017  ENDOCRINE CBG (last 3)  Recent Labs    09/01/17 0017 09/01/17 0415 09/01/17 0848  GLUCAP 121* 117* 151*    A:   Diabetes mellitus P:   Sliding scale insulin per internal medicine  NEUROLOGIC A:   History of CVA without residual effects P:   RASS goal: 0 09/01/2017 currently neurologically intact.  Communication is impeded by the fact he is on BiPAP.   FAMILY  - Updates: 09/01/2017 daughter and patient updated at bedside.  - Inter-disciplinary family meet or Palliative Care meeting due by:  day 7    [facility] Minor ACNP Maryanna Shape PCCM Pager [phone] till 1 pm If no answer page [phone] 09/01/2017, 9:45 AM  Attending Note:  83 year old male with extensive cardiac history who presents to [facility] from [place] with CHF, pulmonary edema and respiratory failure.  On exam, bibasilar crackles and dullness to percussion.  I reviewed chest CT myself, pulmonary edema noted with PE and pleural effusion.  Discussed with Dr. Algis Liming and PCCM-NP.  Acute on chronic respiratory failure:  - Would seriously consider palliative care approach given extent of disease  - BiPAP for comfort  - CPAP at night  Hypoxemia:  - Titrate O2 for sat of 19-14%  Metabolic acidosis from renal failure:  - May consider bicarb drip specially that patient is having a cardiac cath soon  - Diureses may help with that  - If oliguric may need to consider a renal consult   Pulmonary edema:  - Diureses as renal function allows  - Strict I/O  - Minimize fluid as able  Pulmonary embolism:  - Anticoagulation as ordered  - Will need to determine long term anti-coagulant if patient does well.  Pleural effusion:  - No thora for now  - Diureses as renal function allows  PCCM will follow.  Patient seen and examined, agree with above note.  I dictated the care and orders written for this patient under my direction.  Rush Farmer, [place]

## 2017-09-01 NOTE — Progress Notes (Signed)
PROGRESS NOTE   Bernard Kim  NTI:[id number]    DOB: Sep 29, 1930    DOA: 08/31/2017  PCP: Marco Collie, MD   I have briefly reviewed patients previous medical records in [facility].   Brief Narrative:  83 year old male with a PMH of CAD status post remote PCI, CVA without residual deficits, DM 2, HLD, HTN, bladder cancer initially admitted to [facility]. He presented there with 1 month history of exertional dyspnea and found to have small pulmonary embolism, probable mildly decompensated systolic heart failure, atrial fibrillation. He was being appropriately treated but then developed worsening dyspnea with periods of apnea requiring BiPAP in ICU. Cardiology was consulted regarding unstable angina due to transient ST changes in inferior leads, EF 15-20 percent (40-45 percent prior) and new onset systolic CHF. Cardiology recommended transfer to [facility] for left heart cath. Cardiology and pulmonology consulted. Able to come off BiPAP this morning.   Assessment & Plan:   Principal Problem:   Acute respiratory failure with hypoxia (HCC) Active Problems:   Coronary artery disease   Diabetes mellitus, type 2 (HCC)   Essential hypertension   Hypertension   Non-rheumatic mitral regurgitation   Dyspnea   Acute CHF (congestive heart failure) (HCC)   Pulmonary embolism (HCC)   Apneic episode   Paroxysmal A-fib (HCC)   Atrial fibrillation with RVR ([place])   1. Acute on chronic systolic CHF/cardiomyopathy: Cardiology input appreciated. EF in the past: 40-45 percent. Now 10%. Unclear etiology. Could be rate related from A. fib but need to rule out ischemic etiology. Cardiology started IV Lasix 40 mg twice a day, checking 2-D echo now that the heart rate is better controlled and asking advanced heart failure team to see with ultimate goal of R/L heart When renal functions stabilized. No ACEI/ARB due to renal insufficiency and beta blockers due to acute decompensation. 2. A. fib with RVR: As per  cardiology, based upon symptoms probably has had it for a few weeks. Heart rate now controlled on IV amiodarone and is also on IV heparin anticoagulation. 3. CAD status post stenting: Continue aspirin. R/L At some point. Cardiology following. 4. Acute respiratory failure with hypoxia/apneic spells: Progressive dyspnea for 6 weeks, reportedly started after receiving flu shot. Nonsmoker. Was on BiPAP when transferred from [facility]. Able to come off BiPAP this morning. [place] oxygen and maintained saturations >92%. Pulmonary consulted. Are apneic spells [place] respiration? Does not have body habitus for OSA. 5. Acute pulmonary embolism: Noted at [facility]. Apparently very small and would not explain all of his respiratory symptoms. Currently on IV heparin drip. 6. Acute kidney injury/hyperkalemia: Baseline creatinine not known. Presented with creatinine of 1.9 to which increased to 2.29.? Related to poor perfusion from CHF. IV Lasix started. Monitor closely with daily BMP. Hyperkalemia resolved. 7. Anion gap metabolic acidosis: Bicarbonate 15 and anion gap 21. DKA less likely. Check lactate. Likely from acute kidney injury. 8. Elevated INR/abnormal LFTs: Hepatitis picture.? Related to hepatic passive congestion from CHF but doesn't look significantly volume overloaded versus not sure if he came in with hypotension initially. Also on amiodarone. Follow LFTs daily. 9. Type II DM: SSI. Good inpatient control. 10. Essential hypertension: Mildly uncontrolled. Monitor for now on IV Lasix. 11. Thrombocytopenia:? Acute illness. Also on IV heparin. Follow CBCs closely.   DVT prophylaxis: IV heparin drip Code Status: Full Family Communication: Discussed in detail with patient's daughter at bedside. Disposition: To be determined.   Consultants:  Cardiology Pulmonology   Procedures:  BiPAP  Antimicrobials:  None    Subjective: Seen this morning. Reports feeling better. Came off of BiPAP and  asking for food. No dyspnea or chest pain reported. No cough or fevers.   ROS: As above.  Objective:  Vitals:   09/01/17 0600 09/01/17 0644 09/01/17 0909 09/01/17 1008  BP: (!) 129/98  131/90 111/83  Pulse:   (!) 45 80  Resp: (!) 30   (!) 26  Temp:      TempSrc:      SpO2:   100% 99%  Weight:  79.9 kg (176 lb 2.4 oz)    Height:        Examination:  General exam: Elderly male, moderately built and nourished, edentulous, lying comfortably supine in bed. Just came off of BiPAP. Respiratory system: Occasional basal crackles but otherwise clear to auscultation. Respiratory effort normal. Cardiovascular system: S1 & S2 heard, RRR. No JVD, murmurs, rubs, gallops or clicks. Trace ankle edema. Telemetry personally reviewed: Atrial flutter with controlled ventricular rate. Gastrointestinal system: Abdomen is nondistended, soft and nontender. No organomegaly or masses felt. Normal bowel sounds heard. Central nervous system: Alert and oriented. No focal neurological deficits. Extremities: Symmetric 5 x 5 power. Skin: No rashes, lesions or ulcers Psychiatry: Judgement and insight appear normal. Mood & affect appropriate.     Data Reviewed: I have personally reviewed following labs and imaging studies  CBC: Recent Labs  Lab 08/31/17 1934  WBC 11.7*  NEUTROABS 9.4*  HGB 16.6  HCT 49.4  MCV 94.1  PLT 831*   Basic Metabolic Panel: Recent Labs  Lab 08/31/17 1934 09/01/17 0251  NA 136 138  K 5.2* 4.9  CL 102 102  CO2 14* 15*  GLUCOSE 228* 233*  BUN 39* 48*  CREATININE 1.92* 2.29*  CALCIUM 8.5* 8.3*   Liver Function Tests: Recent Labs  Lab 08/31/17 1934 09/01/17 1112  AST 163* 742*  ALT 122* 438*  ALKPHOS 109 108  BILITOT 2.7* 1.8*  PROT 6.1* 6.0*  ALBUMIN 3.6 3.5   Coagulation Profile: Recent Labs  Lab 08/31/17 1934 09/01/17 0251  INR 1.82 1.97   Cardiac Enzymes: Recent Labs  Lab 08/31/17 1934 09/01/17 0251 09/01/17 0813  TROPONINI 0.05* 0.05* 0.04*    HbA1C: No results for input(s): HGBA1C in the last 72 hours. CBG: Recent Labs  Lab 08/31/17 2023 09/01/17 0017 09/01/17 0415 09/01/17 0848 09/01/17 1222  GLUCAP 134* 121* 117* 151* 164*    No results found for this or any previous visit (from the past 240 hour(s)).       Radiology Studies: Dg Chest Port 1 View  Result Date: 09/01/2017 CLINICAL DATA:  83 year old male with a history of shortness of breath EXAM: PORTABLE CHEST 1 VIEW COMPARISON:  08/31/2017, 08/30/2017, CT 08/30/2017 FINDINGS: Cardiomediastinal silhouette unchanged with cardiomegaly. Fullness in the central vasculature. Thickening of the minor fissure. Interlobular septal thickening. Blunting of the right costophrenic angle with hazy opacity at the right lung base. No pneumothorax. IMPRESSION: Evidence of mild pulmonary edema and small right pleural effusion. Cardiomegaly Electronically Signed   By: Corrie Mckusick D.O.   On: 09/01/2017 10:32        Scheduled Meds: . aspirin EC  81 mg Oral Daily  . furosemide  40 mg Intravenous BID  . insulin aspart  0-9 Units Subcutaneous Q4H  . sodium chloride flush  3 mL Intravenous Q12H   Continuous Infusions: . sodium chloride    . amiodarone 30 mg/hr (09/01/17 0335)  . heparin 950 Units/hr (09/01/17 0757)  LOS: 1 day     Vernell Leep, MD, FACP, [facility]. [facility] Pager [phone]  If 7PM-7AM, please contact night-coverage [email or web address] Password [email or web address] 09/01/2017, 2:20 PM

## 2017-09-01 NOTE — Progress Notes (Signed)
CRITICAL VALUE ALERT  Critical Value:  Lactic Acid 2.7  Date & Time Notied:  12/18 1602  Provider Notified: Algis Liming

## 2017-09-01 NOTE — Progress Notes (Signed)
Per MD for RT to remove bipap and place on [place]. RT placed pt on 4L [place] - pt in no distress. Pt tol well

## 2017-09-01 NOTE — Progress Notes (Signed)
ANTICOAGULATION CONSULT NOTE - Follow Up Consult  Pharmacy Consult for heparin Indication: pulmonary embolus  Labs: Recent Labs    08/31/17 1934 09/01/17 0251 09/01/17 0653 09/01/17 0813 09/01/17 1508  HGB 16.6  --   --   --   --   HCT 49.4  --   --   --   --   PLT 140*  --   --   --   --   LABPROT 20.9* 22.3*  --   --   --   INR 1.82 1.97  --   --   --   HEPARINUNFRC  --  1.30* 1.02*  --  0.81*  CREATININE 1.92* 2.29*  --   --   --   TROPONINI 0.05* 0.05*  --  0.04*  --     Assessment: 83yo male still above goal on heparin with initial dosing for PE. Heparin rate adjusted this morning and still above goal. No bleeding issues noted.   Goal of Therapy:  Heparin level 0.3-0.7 units/ml   Plan:  Decrease heparin to 800 units/hr Recheck in am  Erin Hearing PharmD., BCPS Clinical Pharmacist Pager [phone] 09/01/2017 4:43 PM

## 2017-09-01 NOTE — Progress Notes (Signed)
ANTICOAGULATION CONSULT NOTE - Follow Up Consult  Pharmacy Consult for heparin Indication: pulmonary embolus  Labs: Recent Labs    08/31/17 1934 09/01/17 0251 09/01/17 0653  HGB 16.6  --   --   HCT 49.4  --   --   PLT 140*  --   --   LABPROT 20.9* 22.3*  --   INR 1.82 1.97  --   HEPARINUNFRC  --  1.30* 1.02*  CREATININE 1.92* 2.29*  --   TROPONINI 0.05* 0.05*  --     Assessment: 83yo male above goal on heparin with initial dosing for PE though had been on full-dose LMWH prior to starting UFH so that may still be affecting level.  Goal of Therapy:  Heparin level 0.3-0.7 units/ml   Plan:  Will decrease heparin gtt by 2 units/kg/hr to 950 units/hr and check level in [place], PharmD, BCPS  09/01/2017,7:54 AM

## 2017-09-02 DIAGNOSIS — R7401 Elevation of levels of liver transaminase levels: Secondary | ICD-10-CM

## 2017-09-02 DIAGNOSIS — I5021 Acute systolic (congestive) heart failure: Secondary | ICD-10-CM

## 2017-09-02 DIAGNOSIS — E878 Other disorders of electrolyte and fluid balance, not elsewhere classified: Secondary | ICD-10-CM

## 2017-09-02 DIAGNOSIS — N179 Acute kidney failure, unspecified: Secondary | ICD-10-CM

## 2017-09-02 DIAGNOSIS — R740 Nonspecific elevation of levels of transaminase and lactic acid dehydrogenase [LDH]: Secondary | ICD-10-CM

## 2017-09-02 LAB — COMPREHENSIVE METABOLIC PANEL
ALBUMIN: 3.4 g/dL — AB (ref 3.5–5.0)
ALK PHOS: 104 U/L (ref 38–126)
ALT: 475 U/L — AB (ref 17–63)
AST: 536 U/L — AB (ref 15–41)
Anion gap: 10 (ref 5–15)
BUN: 60 mg/dL — AB (ref 6–20)
CHLORIDE: 104 mmol/L (ref 101–111)
CO2: 24 mmol/L (ref 22–32)
CREATININE: 2.09 mg/dL — AB (ref 0.61–1.24)
Calcium: 7.8 mg/dL — ABNORMAL LOW (ref 8.9–10.3)
GFR calc Af Amer: 32 mL/min — ABNORMAL LOW (ref >60)
GFR calc non Af Amer: 27 mL/min — ABNORMAL LOW (ref >60)
GLUCOSE: 188 mg/dL — AB (ref 65–99)
Potassium: 3.4 mmol/L — ABNORMAL LOW (ref 3.5–5.1)
SODIUM: 138 mmol/L (ref 135–145)
Total Bilirubin: 2 mg/dL — ABNORMAL HIGH (ref 0.3–1.2)
Total Protein: 5.7 g/dL — ABNORMAL LOW (ref 6.5–8.1)

## 2017-09-02 LAB — CBC
HCT: 41.9 % (ref 39.0–52.0)
HEMOGLOBIN: 14.2 g/dL (ref 13.0–17.0)
MCH: 31.1 pg (ref 26.0–34.0)
MCHC: 33.9 g/dL (ref 30.0–36.0)
MCV: 91.9 fL (ref 78.0–100.0)
PLATELETS: 119 10*3/uL — AB (ref 150–400)
RBC: 4.56 MIL/uL (ref 4.22–5.81)
RDW: 13.5 % (ref 11.5–15.5)
WBC: 9 10*3/uL (ref 4.0–10.5)

## 2017-09-02 LAB — HEMOGLOBIN A1C
HEMOGLOBIN A1C: 8 % — AB (ref 4.8–5.6)
MEAN PLASMA GLUCOSE: 183 mg/dL

## 2017-09-02 LAB — GLUCOSE, CAPILLARY
Glucose-Capillary: 131 mg/dL — ABNORMAL HIGH (ref 65–99)
Glucose-Capillary: 176 mg/dL — ABNORMAL HIGH (ref 65–99)
Glucose-Capillary: 196 mg/dL — ABNORMAL HIGH (ref 65–99)
Glucose-Capillary: 185 mg/dL — ABNORMAL HIGH (ref 65–99)

## 2017-09-02 LAB — HEPARIN LEVEL (UNFRACTIONATED)
Heparin Unfractionated: 0.46 IU/mL (ref 0.30–0.70)
Heparin Unfractionated: 0.37 IU/mL (ref 0.30–0.70)

## 2017-09-02 LAB — PHOSPHORUS: PHOSPHORUS: 3.5 mg/dL (ref 2.5–4.6)

## 2017-09-02 MED ORDER — POTASSIUM CHLORIDE CRYS ER 20 MEQ PO TBCR
30.0000 meq | EXTENDED_RELEASE_TABLET | Freq: Once | ORAL | Status: AC
  Administered 2017-09-02: 30 meq via ORAL

## 2017-09-02 NOTE — Consult Note (Signed)
Advanced Heart Failure Team Consult Note   Primary Physician: Dr. Felecia Jan  Primary Cardiologist:  Dr. Otho Perl [id number])  Reason for Consultation: Heart failure   HPI:    Bernard Kim is seen today for evaluation of heart failure at the request of Dr. Radford Pax.   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  Initially admitted to [facility] with 1 month of progressive exertional dyspnea and found to have a small, R segmental PE. TTE showed worsening EF of 10-15% from 40-45 % previously and severe MR. Studies not available to review. His EKG showed atrial tachycardia vs ST with PACs. He was started on Lovenox for PE and beta blocker. His respiratory status continued to decline and he was escalated to BiPAP.  Repeat EKG showed subtle ST elevations in inferior leads which disappeared on subsequent repeat EKG which showed junctional tachycardia. He was transferred to [facility] for further evaluation and management of HF.   Patient has been noticing increasing fatigue with exertion since June 2018 but still able to perform ADLs. Five weeks ago he noticed progressive shortness of breath and having a difficulty completing yard work needing several breaks. He has also been having difficulty sleeping and has not been able to lay flat in bed for the past 2 weeks. He reports decreased appetite for 3 weeks, but weight loss. Has not noticed lower extremity swelling, but does report abdominal bloating. Also endorses a productive cough for 6 weeks after receiving flu shot. Denies fever, chills, night sweats, weakness, and myalgias. Has been told he has episodes of Afib in the past, but usually not able to tell. No chest pain since symptoms started and chest pain free when seen.   On arrival to [facility] he was found to be  acidotic with ABG 7.3/37/148/18, hyperkalemic 5.2, AKI with Cr 1.92 (renal function normal at baseline), and transaminitis as well as elevated T bili 2.7. Cardiology consulted and he was started on IV Lasix 40 BID, heparin and amio gtt. Respiratory status now improved. Now on 2L [place] and CPAP at night. Renal function improving.   Per cardiology note 11/21, LHC in 07/2014 showed patent RCA stent, moderate LCA and Lcx disease for which he was medically managed. He has a history of PVCs and profound bradycardia in 2016 and beta blocker discontinued. He underwent a myocardial perfusion scan which showed no evidence of ischemia. He was lost to follow up after this and presented in 11/21 with progressive SOB. TTE and  Holter monitor.   Holter monitor:  1.Ventricular: Frequent PVC's with 38,756 during the monitoring with 29,394 single beats, 3,215 couplets and344 runs the longest being 12 beats  2.Supraventricular: Occasional PAC's with 13,812 captured with 10,937 single beats, 1175 pairs and 156 runs--longest 15 beats.The patient was in atrial fibrillation for the first 2.5 hours of wearing the monitor. 3.Bradyarrhythmias    Review of Systems: [y] = yes, [ ]  = no   General: Weight gain Blue.Reese ]; Weight loss [ ] ; Anorexia Blue.Reese ]; Fatigue Blue.Reese ]; Fever [ ] ; Chills [ ] ; Weakness [ ]   Cardiac: Chest pain/pressure [ ] ; Resting SOB [ ] ; Exertional SOB [ y]; Orthopnea [ y]; Pedal Edema [ ] ; Palpitations [ ] ; Syncope [ ] ; Presyncope [ ] ; Paroxysmal nocturnal dyspnea[ ]   Pulmonary: Cough Blue.Reese ]; Wheezing[ ] ; Hemoptysis[ ] ; Sputum [ ] ; Snoring Blue.Reese ]  GI: Vomiting[ ] ; Dysphagia[ ] ; Melena[ ] ; Hematochezia [ ] ; Heartburn[ ] ; Abdominal pain [ ] ; Constipation [ ] ; Diarrhea [ ] ; BRBPR [ ]   GU: Hematuria[ ] ; Dysuria [ ] ; Nocturia[ ]   Vascular: Pain in legs with walking [ ] ; Pain in feet with lying flat [ ] ; Non-healing sores [ ] ; Stroke [ ] ; TIA [ ] ; Slurred speech [ ] ;  Neuro: Headaches[ ] ; Vertigo[ ] ; Seizures[ ] ;  Paresthesias[ ] ;Blurred vision [ ] ; Diplopia [ ] ; Vision changes [ ]   Ortho/Skin: Arthritis [ ] ; Joint pain [ ] ; Muscle pain [ ] ; Joint swelling [ ] ; Back Pain [ ] ; Rash [ ]   Psych: Depression[ ] ; Anxiety[ ]   Heme: Bleeding problems [ ] ; Clotting disorders [ ] ; Anemia [ ]   Endocrine: Diabetes Blue.Reese ]; Thyroid dysfunction[ ]   Home Medications Prior to Admission medications   Medication Sig Start Date End Date Taking? Authorizing Provider  clopidogrel (PLAVIX) 75 MG tablet Take 75 mg by mouth.   Yes [provider]  losartan (COZAAR) 25 MG tablet Take 25 mg by mouth.   Yes [provider]  lovastatin (MEVACOR) 40 MG tablet Take 40 mg by mouth at bedtime.    Yes [provider]  metoprolol tartrate (LOPRESSOR) 25 MG tablet Take 25 mg by mouth 2 (two) times daily.  07/11/14  Yes [provider]  acetaminophen (TYLENOL 8 HOUR ARTHRITIS PAIN) 650 MG CR tablet Take 650 mg by mouth every 8 (eight) hours as needed for pain.    [provider]  aspirin EC 81 MG tablet Take 81 mg by mouth.    [provider]  docusate sodium (COLACE) 100 MG capsule Take 100 mg by mouth. 02/12/16   [provider]    Past Medical History: Past Medical History:  Diagnosis Date  . Cancer ([name])    bladdre cancer  . CHF (congestive heart failure) ([place])   . Coronary artery disease   . Dyspnea   . Hypertension   . Stroke [name])     Past Surgical History: Past Surgical History:  Procedure Laterality Date  . CARDIAC CATHETERIZATION      Family History: No family history on file.  Social History: Social History   Socioeconomic History  . Marital status: Married    Spouse name: None  . Number of children: None  . Years of education: None  . Highest education level: None  Social Needs  . Financial resource strain: None  . Food insecurity - worry: None  . Food insecurity - inability: None  . Transportation needs - medical: None  . Transportation  needs - non-medical: None  Occupational History  . None  Tobacco Use  . Smoking status: Never Smoker  . Smokeless tobacco: Never Used  Substance and Sexual Activity  . Alcohol use: No    Frequency: Never  . Drug use: No  . Sexual activity: None  Other Topics Concern  . None  Social History Narrative  . None    Allergies:  No Known Allergies  Objective:    Vital Signs:   Temp:  [96.5 F (35.8 C)-98.1 F (36.7 C)] 97.9 F (36.6 C) (12/19 0500) Pulse Rate:  [45-116] 116 (12/19 0800) Resp:  [8-31] 26 (12/19 0800) BP: (111-134)/(64-84) 114/70 (12/19 0800) SpO2:  [99 %-100 %] 100 % (12/19 0800) Weight:  [179 lb 14.3 oz (81.6 kg)] 179 lb 14.3 oz (81.6 kg) (12/19 0400)  Last BM Date: 08/30/17  Weight change: Filed Weights   08/31/17 1814 09/01/17 0644 09/02/17 0400  Weight: 177 lb 14.6 oz (80.7 kg) 176 lb 2.4 oz (79.9 kg) 179 lb 14.3 oz (81.6 kg)    Intake/Output:   Intake/Output Summary (Last 24 hours) at 09/02/2017 0942 Last data filed at 09/02/2017 0450 Gross per 24 hour  Intake 240 ml  Output 1850 ml  Net -1610 ml      Physical Exam    General:  Elderly male. HOH. Appears well. Sitting up in bed in no acute distress. On 2L [place] and oxygenating well. Able to speak in full sentences.  HEENT: normal Neck: supple. JVP ~7. Carotids 2+ bilat; no bruits. No lymphadenopathy or thyromegaly appreciated. Cor: PMI nondisplaced. Tachycardic and irregular rhythm. No rubs, gallops. 2/6 AS murmur at RSB s2 ok  Lungs: crackles at bases. otherrwise clear  Abdomen: soft, nontender, nondistended. No hepatosplenomegaly. No bruits or masses. Good bowel sounds. Extremities: no cyanosis, clubbing, rash, edema Neuro: alert & orientedx3, cranial nerves grossly intact. moves all 4 extremities w/o difficulty. Affect pleasant   Telemetry   Atrial fibrillation 100-120 bpm Frequent PVCs, missed beats. Personally reviewed.   EKG     Frequent PVCs, missed beats. Personally  reviewed  Labs   Basic Metabolic Panel: Recent Labs  Lab 08/31/17 12-12-1932 09/01/17 0251 09/02/17 0035  NA 136 138 138  K 5.2* 4.9 3.4*  CL 102 102 104  CO2 14* 15* 24  GLUCOSE 228* 233* 188*  BUN 39* 48* 60*  CREATININE 1.92* 2.29* 2.09*  CALCIUM 8.5* 8.3* 7.8*    Liver Function Tests: Recent Labs  Lab 08/31/17 1934 09/01/17 1112 09/02/17 0035  AST 163* 742* 536*  ALT 122* 438* 475*  ALKPHOS 109 108 104  BILITOT 2.7* 1.8* 2.0*  PROT 6.1* 6.0* 5.7*  ALBUMIN 3.6 3.5 3.4*   No results for input(s): LIPASE, AMYLASE in the last 168 hours. No results for input(s): AMMONIA in the last 168 hours.  CBC: Recent Labs  Lab 08/31/17 1934 09/02/17 0035  WBC 11.7* 9.0  NEUTROABS 9.4*  --   HGB 16.6 14.2  HCT 49.4 41.9  MCV 94.1 91.9  PLT 140* 119*    Cardiac Enzymes: Recent Labs  Lab 08/31/17 1932-12-12 09/01/17 0251 09/01/17 0813  TROPONINI 0.05* 0.05* 0.04*    BNP: BNP (last 3 results) Recent Labs    08/31/17 1934  BNP 586.5*    ProBNP (last 3 results) No results for input(s): PROBNP in the last 8760 hours.   CBG: Recent Labs  Lab 09/01/17 0848 09/01/17 1222 09/01/17 1633 09/01/17 13-Dec-2107 09/02/17 0558  GLUCAP 151* 164* 232* 167* 131*    Coagulation Studies: Recent Labs    08/31/17 1932/12/12 09/01/17 0251  LABPROT 20.9* 22.3*  INR 1.82 1.97     Imaging   Dg Chest Port 1 View  Result Date: 09/01/2017 CLINICAL DATA:  83 year old male with a history of shortness of breath EXAM: PORTABLE CHEST 1 VIEW COMPARISON:  08/31/2017, 08/30/2017, CT 08/30/2017 FINDINGS: Cardiomediastinal silhouette unchanged with cardiomegaly. Fullness in the central vasculature. Thickening of the minor fissure. Interlobular septal thickening. Blunting of the right costophrenic angle with hazy opacity at the right lung base. No pneumothorax. IMPRESSION: Evidence of mild pulmonary edema and small right pleural effusion. Cardiomegaly Electronically Signed   By: Corrie Mckusick D.O.    On: 09/01/2017 10:32      Medications:     Current Medications: . aspirin EC  81 mg Oral Daily  .  furosemide  40 mg Intravenous BID  . insulin aspart  0-5 Units Subcutaneous QHS  . insulin aspart  0-9 Units Subcutaneous TID WC  . sodium chloride flush  3 mL Intravenous Q12H     Infusions: . sodium chloride    . amiodarone 30 mg/hr (09/02/17 0311)  . heparin 950 Units/hr (09/01/17 0757)       Patient Profile   Bernard Kim is a 83 yo M with history of CAD, inferior MI s/p remote PCI to RCA in 1995, HTN, CVA in 2017 with no residual deficits, and bladder cancer on BCG treatment who was admitted for acute decompensation of heart failure.  Assessment/Plan   1. Acute decompensation HFrEF:Likely secondary to onset of Afib with RVR. Now on NSR on amio gtt.  Will need LHC for evaluation of coronary arteries. TTE with EF 15% at [facility]. This was performed while he was in Afib with RVR. Will need repeat TTE now that he is rate controlled to assess LV function. Currently on IV Lasix 40 BID with good UOP but up 2 pounds since admission. Mildly hypervolemic on exam, but respiratory status much improved. Would continue current diuresis regimen.  - RHC and LHC maybe tomorrow if improvement in renal function  - Repeat echo  - No beta blocker for now in the setting of acute decompensation  - No ARB/ ARNI at this time pending improvement in renal function  - IV Lasix 40 BID  - PT eval  2. Atrial fibrillation: now rate controlled and on NSR on amio gtt  - On amiodarone gtt  - On heparin gtt   3. PE: small, R -sided segmental PE seen on CTA chest. On heparin gtt.  - Continue heparin ggtt   4. Acute hypoxic respiratory failure: in the setting of acute decompensation HF. Also considering ongoing infectious process given productive cough. Pan-cx per PCCM yesterday. Respiratory status improving. Now on [place] and CPAP at night.  - Diuresis as above  - PCCM following, follow up sputum  and blood cx  - Never smoker  - Will need outpatient sleep study to evaluate for OSA     5. Transaminitis with abnormal INR and thromboytopenia: AST 536 and ALT 475, stable compared to yesterday. Suspect secondary to shock liver from presumed hypotension while OSH vs hepatic congestion. INR 1.97 yesterday. Not on warfarin. No signs/symptoms of active bleeding at this time.  -  Continue to monitor. Appears they have peaked.   6. Thrombocytopenia: in the setting of liver dysfunction but also stared on heparin gtt 2 days ago. Plt 140 12/17--> 119 12/19. Platelets normal on previous CBC from 2015 - Follow up CBC closely   7. AKI: with hyperkalemia and HAGMA.  Admission Cr 1.92 and K 5.2. Today Cr 2.09 from 2.29 yesterday and K 3.4. Bicarb 24.  - Continue to monitor renal function and electrolytes in the setting of active diuresis  - Replete lytes PRN   8. T2DM: well controlled. Last A1c 8 09/01/2017.  - On SSI per primary   9. Bladder cancer: on treatment with BCG. Last treatment on 11/19 per urology note. Follows up with Dr. Rosana Hoes.    Medication concerns reviewed with patient and pharmacy team.   Length of Stay: 2  Welford Roche, MD  09/02/2017, 9:42 AM  Advanced Heart Failure Team Pager [phone] (M-F; 7a - 4p)  Please contact [facility] for night-coverage after hours (4p -7a ) and weekends on [email or web address]   Patient seen and examined  with the above-signed Advanced Practice Provider and/or Housestaff. I personally reviewed laboratory data, imaging studies and relevant notes. I independently examined the patient and formulated the important aspects of the plan. I have edited the note to reflect any of my changes or salient points. I have personally discussed the plan with the patient and/or family.  83 y/o male with h/o CAD and previous stent in 1995 with Dr. Lia Foyer. Baseline EF reportedly ~45%. Recently admitted with acute respiratory failure and shock in setting of acute  systolic HF with EF ~22%. Course c/b by AKI and shock liver. Suspect LV dysfunction related to tachy-induced CM with AF. Has now reverted to NSR on amio. Renal function anf LFTs are improving.   Would continue current management for now. Agree with need for R/L cath if/when creatinine <= 1.8. Possibly tomorrow or Friday. Continue diuresis. Await repeat echo. Will need PT/OT to see.   Discussed with patient and family at length. The AHF team will follow.   Glori Bickers, MD  7:41 PM

## 2017-09-02 NOTE — Progress Notes (Signed)
PULMONARY / CRITICAL CARE MEDICINE   Name: Bernard Kim MRN: [id number] DOB: [birth year]    ADMISSION DATE:  08/31/2017 CONSULTATION DATE: 09/01/2017  REFERRING MD: [facility]  CHIEF COMPLAINT: Dyspnea on exertion, no sputum production.  HISTORY OF PRESENT ILLNESS:   Bernard Kim is an 83 year old male with a significant past medical history that includes but not limited to prostate cancer, bladder cancer treated with BCG live, ejection fraction noted to be 15%, coronary artery disease with a right RCA stent, diabetes mellitus, hypertension, new onset of atrial fibrillation rapid ventricular response. He was admitted to [facility] initially dyspnea on exertion and required noninvasive mechanical ventilatory support.  To have atrial fibrillation rapid response which is been placed on amiodarone and heparin.  He was transferred to [facility] 08/31/2017 and has remained on noninvasive mechanical ventilatory support for the last 31 hours as of 09/01/2017 at 1000 hrs. PCCM consult of her dyspnea and use of noninvasive mechanical ventilatory support on 09/01/2017.    SUBJECTIVE:   VITAL SIGNS: BP 114/70 (BP Location: Left Arm)   Pulse (!) 116   Temp 97.9 F (36.6 C) (Oral)   Resp (!) 26   Ht 5\' 8"  (1.727 m)   Wt 179 lb 14.3 oz (81.6 kg)   SpO2 100%   BMI 27.35 kg/m  2 L via nasal cannula HEMODYNAMICS:    VENTILATOR SETTINGS:    INTAKE / OUTPUT:  Intake/Output Summary (Last 24 hours) at 09/02/2017 1134 Last data filed at 09/02/2017 1107 Gross per 24 hour  Intake 240 ml  Output 3000 ml  Net -2760 ml    PHYSICAL EXAMINATION: General: 83 year old white male currently resting comfortably in bed no acute distress. HEENT: Normocephalic atraumatic no jugular venous distention mucous membranes are moist. Pulmonary: Clear to auscultation decreased bases no accessory muscle use. Cardiac: Regular irregular with atrial fibrillation noted on  monitor. Extremities/musculoskeletal: Warm/dry, no edema, brisk cap refill, strong pulses.  Next abdomen: Soft nontender no organomegaly positive bowel sounds tolerating diet. Neuro: Awake oriented no focal deficits  LABS:  BMET Recent Labs  Lab 08/31/17 1934 09/01/17 0251 09/02/17 0035  NA 136 138 138  K 5.2* 4.9 3.4*  CL 102 102 104  CO2 14* 15* 24  BUN 39* 48* 60*  CREATININE 1.92* 2.29* 2.09*  GLUCOSE 228* 233* 188*    Electrolytes Recent Labs  Lab 08/31/17 1934 09/01/17 0251 09/02/17 0035 09/02/17 0957  CALCIUM 8.5* 8.3* 7.8*  --   PHOS  --   --   --  3.5    CBC Recent Labs  Lab 08/31/17 1934 09/02/17 0035  WBC 11.7* 9.0  HGB 16.6 14.2  HCT 49.4 41.9  PLT 140* 119*    Coag's Recent Labs  Lab 08/31/17 1934 09/01/17 0251  INR 1.82 1.97    Sepsis Markers Recent Labs  Lab 09/01/17 1508 09/01/17 1917  LATICACIDVEN 2.7* 2.7*    ABG Recent Labs  Lab 08/31/17 1900  PHART 7.304*  PCO2ART 37.7  PO2ART 148*    Liver Enzymes Recent Labs  Lab 08/31/17 1934 09/01/17 1112 09/02/17 0035  AST 163* 742* 536*  ALT 122* 438* 475*  ALKPHOS 109 108 104  BILITOT 2.7* 1.8* 2.0*  ALBUMIN 3.6 3.5 3.4*    Cardiac Enzymes Recent Labs  Lab 08/31/17 1934 09/01/17 0251 09/01/17 0813  TROPONINI 0.05* 0.05* 0.04*    Glucose Recent Labs  Lab 09/01/17 0848 09/01/17 1222 09/01/17 1633 09/01/17 2109 09/02/17 0558 09/02/17 1105  GLUCAP 151* 164*  232* 167* 131* 176*    Imaging No results found.   STUDIES:  09/01/2017 chest x-ray pending  CULTURES: 09/01/2017 sputum 09/01/2017 blood cultures x2 09/01/2017 urine culture  ANTIBIOTICS: None  SIGNIFICANT EVENTS: 08/31/2017 transferred to [facility] from [facility] 09/01/2017 pulmonary critical care consult  LINES/TUBES: None  DISCUSSION:  Acute on Chronic systolic HF W/ EF now depressed to 10%. Likely exacerbated by AF w/ RVR H/o coronary artery disease.  Plan Per  cards.  Acute on chronic respiratory failure in setting of Pulmonary edema, complicated by a lessor extent by: small right pleural effusion an acute pulmonary emboli.  -clinically improved  Plan Cont aggressive diuresis o2 as needed Cont AC Repeat chest x-ray in a.m., follow up on effusion did appear small.  Suspect no need for thoracentesis and can be treated with diuretics  AKI.  -improving Plan  Cont current rx   Shock liver  Plan Trend   We will see again in a.m. to follow-up on chest x-ray   Bernard Kim ACNP-BC [place] Pager # [phone] OR # [phone] if no answer

## 2017-09-02 NOTE — Progress Notes (Signed)
ANTICOAGULATION CONSULT NOTE - Follow Up Consult  Pharmacy Consult for Heparin Indication: pulmonary embolus and atrial fibrillation  No Known Allergies  Patient Measurements: Height: 5\' 8"  (172.7 cm) Weight: 179 lb 14.3 oz (81.6 kg) IBW/kg (Calculated) : 68.4  Vital Signs: Temp: 97.9 F (36.6 C) (12/19 0500) Temp Source: Oral (12/19 0800) BP: 114/70 (12/19 0800) Pulse Rate: 116 (12/19 0800)  Labs: Recent Labs    08/31/17 1934 09/01/17 0251  09/01/17 0813 09/01/17 1508 09/02/17 0035 09/02/17 0720  HGB 16.6  --   --   --   --  14.2  --   HCT 49.4  --   --   --   --  41.9  --   PLT 140*  --   --   --   --  119*  --   LABPROT 20.9* 22.3*  --   --   --   --   --   INR 1.82 1.97  --   --   --   --   --   HEPARINUNFRC  --  1.30*   < >  --  0.81* 0.46 0.37  CREATININE 1.92* 2.29*  --   --   --  2.09*  --   TROPONINI 0.05* 0.05*  --  0.04*  --   --   --    < > = values in this interval not displayed.    Estimated Creatinine Clearance: 25.5 mL/min (A) (by C-G formula based on SCr of 2.09 mg/dL (H)).   Medications:  Heparin @ 800 units/hr  Assessment: 86yom continues on heparin for acute PE (found at [facility]) and new afib. Heparin level is therapeutic at 0.37. CBC stable except platelets dropping 140 > 119. No bleeding reported.  Goal of Therapy:  Heparin level 0.3-0.7 units/ml Monitor platelets by anticoagulation protocol: Yes   Plan:  1) Continue heparin at 800 units/hr 2) Follow up plan for possible cath 3) Follow up transition to oral anticoagulation  Deboraha Sprang 09/02/2017,12:02 PM

## 2017-09-02 NOTE — Progress Notes (Signed)
ANTICOAGULATION CONSULT NOTE - Follow Up Consult  Pharmacy Consult for heparin Indication: pulmonary embolus  Labs: Recent Labs    08/31/17 1934  09/01/17 0251 09/01/17 0653 09/01/17 0813 09/01/17 1508 09/02/17 0035  HGB 16.6  --   --   --   --   --  14.2  HCT 49.4  --   --   --   --   --  41.9  PLT 140*  --   --   --   --   --  PENDING  LABPROT 20.9*  --  22.3*  --   --   --   --   INR 1.82  --  1.97  --   --   --   --   HEPARINUNFRC  --    < > 1.30* 1.02*  --  0.81* 0.46  CREATININE 1.92*  --  2.29*  --   --   --   --   TROPONINI 0.05*  --  0.05*  --  0.04*  --   --    < > = values in this interval not displayed.    Assessment/Plan:  83yo male therapeutic on heparin after rate changes. Will continue gtt at current rate and confirm stable with additional level.   Wynona Neat, PharmD, BCPS  09/02/2017,1:17 AM

## 2017-09-02 NOTE — Progress Notes (Signed)
PROGRESS NOTE   Bernard Kim  GYK:[id number]    DOB: 1931/09/11    DOA: 08/31/2017  PCP: Marco Collie, MD     Brief Narrative:  83 year old male with a PMH of CAD status post remote PCI, CVA without residual deficits, DM 2, HLD, HTN, bladder cancer initially admitted to [facility]. He presented there with 1 month history of exertional dyspnea and found to have small pulmonary embolism, probable mildly decompensated systolic heart failure, atrial fibrillation. He was being appropriately treated but then developed worsening dyspnea with periods of apnea requiring BiPAP in ICU. Cardiology was consulted regarding unstable angina due to transient ST changes in inferior leads, EF 15-20 percent (40-45 percent prior) and new onset systolic CHF. Cardiology recommended transfer to [facility] for left heart cath. Cardiology and pulmonology consulted.  Patient weaned off of BiPAP.  Currently doing well on amiodarone.   Assessment & Plan:   Principal Problem:   Acute respiratory failure with hypoxia (HCC) Active Problems:   Coronary artery disease   Diabetes mellitus, type 2 (HCC)   Essential hypertension   Hypertension   Non-rheumatic mitral regurgitation   Dyspnea   Acute CHF (congestive heart failure) (HCC)   Pulmonary embolism (HCC)   Apneic episode   Paroxysmal A-fib (HCC)   Atrial fibrillation with RVR (HCC)   Transaminitis   1. Acute on chronic systolic CHF/cardiomyopathy: Cardiology input appreciated. EF in the past: 40-45 percent. Now 10%. Unclear etiology. Could be rate related from A. fib but need to rule out ischemic etiology. Cardiology started IV Lasix 40 mg twice a day, checking 2-D echo now that the heart rate is better controlled and asking advanced heart failure team to see with ultimate goal of R/L heart When renal functions stabilized. No ACEI/ARB due to renal insufficiency and beta blockers due to acute decompensation. 2. A. fib with RVR: As per cardiology, based upon symptoms  probably has had it for a few weeks. Heart rate now controlled on IV amiodarone and is also on IV heparin anticoagulation.  3. CAD status post stenting: Continue aspirin. R/L At some point. Cardiology following. 4. Acute respiratory failure with hypoxia/apneic spells: Progressive dyspnea for 6 weeks, reportedly started after receiving flu shot. Nonsmoker. Was on BiPAP when transferred from [facility]. Off BiPAP. [place] oxygen and maintained saturations >92%. Pulmonary consulted. Are apneic spells Chyne Stokes respiration? Does not have body habitus for OSA.  Pulm to coordinate sleep study at discharge. 5. Acute pulmonary embolism: Noted at [facility]. Apparently very small and would not explain all of his respiratory symptoms. Currently on IV heparin drip. 6. Acute kidney injury/hyperkalemia: Baseline creatinine not known. Presented with creatinine of 1.9 to which increased to 2.29-->2.09. Likely due to hypoxia. 7. Anion gap metabolic acidosis: Better with AKI improvement. 8. Elevated INR/abnormal LFTs: Hepatitis picture.? Related to hepatic passive congestion from CHF but doesn't look significantly volume overloaded versus not sure if he came in with hypotension initially. Also on amiodarone. Follow LFTs daily. 9. Type II DM: SSI.  10. Essential hypertension: Mildly uncontrolled. Monitor for now on IV Lasix. 11. Thrombocytopenia:? Acute illness. Also on IV heparin. Follow CBCs closely. Lower today. 12. Electrolyte abn: Replace and recheck in AM   DVT prophylaxis: IV heparin drip Code Status: Full Family Communication: Discussed in detail with patient's son at bedside. Disposition: To be determined.   Consultants:  Cardiology Pulmonology   Procedures:  BiPAP prn  Antimicrobials:  None    Subjective: Seen this morning. Doing well. No complaints.  ROS: As above.  Objective:  Vitals:   09/01/17 2358 09/02/17 0400 09/02/17 0500 09/02/17 0800  BP: 117/64 134/81  114/70  Pulse: (!) 45  70  (!) 116  Resp: (!) 8 13  (!) 26  Temp: (!) 97.1 F (36.2 C) (!) 96.5 F (35.8 C) 97.9 F (36.6 C)   TempSrc: Axillary Axillary Oral Oral  SpO2: 99% 100%  100%  Weight:  81.6 kg (179 lb 14.3 oz)    Height:        Examination:  General exam: Elderly male, moderately built and nourished, edentulous, lying comfortably supine in bed. A&Ox3 Respiratory system: CTAB. Respiratory effort normal. Cardiovascular system: S1 & S2 heard, RRR. No JVD, murmurs, rubs, gallops or clicks. Trace ankle edema. Telemetry personally reviewed: Atrial fib with controlled ventricular rate. Occ PVCs Gastrointestinal system: Abdomen is nondistended, BS+ Central nervous system: Alert and oriented. No focal neurological deficits. Extremities: Symmetric 5 x 5 power. Skin: No rashes, lesions or ulcers Psychiatry: Judgement and insight appear normal. Mood & affect appropriate.     Data Reviewed: I have personally reviewed following labs and imaging studies  CBC: Recent Labs  Lab 08/31/17 1934 09/02/17 0035  WBC 11.7* 9.0  NEUTROABS 9.4*  --   HGB 16.6 14.2  HCT 49.4 41.9  MCV 94.1 91.9  PLT 140* 283*   Basic Metabolic Panel: Recent Labs  Lab 08/31/17 1934 09/01/17 0251 09/02/17 0035  NA 136 138 138  K 5.2* 4.9 3.4*  CL 102 102 104  CO2 14* 15* 24  GLUCOSE 228* 233* 188*  BUN 39* 48* 60*  CREATININE 1.92* 2.29* 2.09*  CALCIUM 8.5* 8.3* 7.8*   Liver Function Tests: Recent Labs  Lab 08/31/17 1934 09/01/17 1112 09/02/17 0035  AST 163* 742* 536*  ALT 122* 438* 475*  ALKPHOS 109 108 104  BILITOT 2.7* 1.8* 2.0*  PROT 6.1* 6.0* 5.7*  ALBUMIN 3.6 3.5 3.4*   Coagulation Profile: Recent Labs  Lab 08/31/17 1934 09/01/17 0251  INR 1.82 1.97   Cardiac Enzymes: Recent Labs  Lab 08/31/17 1934 09/01/17 0251 09/01/17 0813  TROPONINI 0.05* 0.05* 0.04*   HbA1C: Recent Labs    09/01/17 1508  HGBA1C 8.0*   CBG: Recent Labs  Lab 09/01/17 0848 09/01/17 1222 09/01/17 1633  09/01/17 2109 09/02/17 0558  GLUCAP 151* 164* 232* 167* 131*    Recent Results (from the past 240 hour(s))  Culture, expectorated sputum-assessment     Status: None   Collection Time: 09/01/17  1:04 PM  Result Value Ref Range Status   Specimen Description SPUTUM  Final   Special Requests Immunocompromised  Final   Sputum evaluation   Final    Sputum specimen not acceptable for testing.  Please recollect.   Gram Stain Report Called to,Read Back By and Verified With: RN Dola Factor [phone] MLM    Report Status 09/01/2017 FINAL  Final         Radiology Studies: Dg Chest Port 1 View  Result Date: 09/01/2017 CLINICAL DATA:  83 year old male with a history of shortness of breath EXAM: PORTABLE CHEST 1 VIEW COMPARISON:  08/31/2017, 08/30/2017, CT 08/30/2017 FINDINGS: Cardiomediastinal silhouette unchanged with cardiomegaly. Fullness in the central vasculature. Thickening of the minor fissure. Interlobular septal thickening. Blunting of the right costophrenic angle with hazy opacity at the right lung base. No pneumothorax. IMPRESSION: Evidence of mild pulmonary edema and small right pleural effusion. Cardiomegaly Electronically Signed   By: Corrie Mckusick D.O.   On: 09/01/2017 10:32  Scheduled Meds: . aspirin EC  81 mg Oral Daily  . furosemide  40 mg Intravenous BID  . insulin aspart  0-5 Units Subcutaneous QHS  . insulin aspart  0-9 Units Subcutaneous TID WC  . potassium chloride  30 mEq Oral Once  . sodium chloride flush  3 mL Intravenous Q12H   Continuous Infusions: . sodium chloride    . amiodarone 30 mg/hr (09/02/17 0311)  . heparin 950 Units/hr (09/01/17 0757)     LOS: 2 days     Elwin Mocha, MD [facility] Pager AMION  If 7PM-7AM, please contact night-coverage [email or web address] Password [email or web address] 09/02/2017, 10:04 AM

## 2017-09-02 NOTE — Progress Notes (Addendum)
Progress Note  Patient Name: Bernard Kim Date of Encounter: 09/02/2017  Primary Cardiologist: [facility]/ Also started Following Dr Otho Perl [place] recently   Subjective   Feels better this am.  Laying flat with no SOB.  Wants to know when he can get up and walk  Inpatient Medications    Scheduled Meds: . aspirin EC  81 mg Oral Daily  . furosemide  40 mg Intravenous BID  . insulin aspart  0-5 Units Subcutaneous QHS  . insulin aspart  0-9 Units Subcutaneous TID WC  . sodium chloride flush  3 mL Intravenous Q12H   Continuous Infusions: . sodium chloride    . amiodarone 30 mg/hr (09/02/17 0311)  . heparin 950 Units/hr (09/01/17 0757)   PRN Meds: sodium chloride, acetaminophen, ondansetron (ZOFRAN) IV, sodium chloride flush   Vital Signs    Vitals:   09/01/17 2358 09/02/17 0400 09/02/17 0500 09/02/17 0800  BP: 117/64 134/81  114/70  Pulse: (!) 45 70  (!) 116  Resp: (!) 8 13  (!) 26  Temp: (!) 97.1 F (36.2 C) (!) 96.5 F (35.8 C) 97.9 F (36.6 C)   TempSrc: Axillary Axillary Oral Oral  SpO2: 99% 100%  100%  Weight:  179 lb 14.3 oz (81.6 kg)    Height:        Intake/Output Summary (Last 24 hours) at 09/02/2017 0919 Last data filed at 09/02/2017 0450 Gross per 24 hour  Intake 240 ml  Output 1850 ml  Net -1610 ml   Filed Weights   08/31/17 1814 09/01/17 0644 09/02/17 0400  Weight: 177 lb 14.6 oz (80.7 kg) 176 lb 2.4 oz (79.9 kg) 179 lb 14.3 oz (81.6 kg)    Telemetry    Atrial fibrillation with PVCs - Personally Reviewed  ECG    No new EKG to review - Personally Reviewed  Physical Exam   GEN: No acute distress.   Neck: No bruit Cardiac: irregularlyh irregular, no murmurs, rubs, or gallops.  Respiratory: Clear to auscultation bilaterally. GI: Soft, nontender, non-distended  MS: No edema; No deformity. Neuro:  Nonfocal  Psych: Normal affect   Labs    Chemistry Recent Labs  Lab 08/31/17 1934 09/01/17 0251  09/01/17 1112 09/02/17 0035  NA 136 138  --  138  K 5.2* 4.9  --  3.4*  CL 102 102  --  104  CO2 14* 15*  --  24  GLUCOSE 228* 233*  --  188*  BUN 39* 48*  --  60*  CREATININE 1.92* 2.29*  --  2.09*  CALCIUM 8.5* 8.3*  --  7.8*  PROT 6.1*  --  6.0* 5.7*  ALBUMIN 3.6  --  3.5 3.4*  AST 163*  --  742* 536*  ALT 122*  --  438* 475*  ALKPHOS 109  --  108 104  BILITOT 2.7*  --  1.8* 2.0*  GFRNONAA 30* 25*  --  27*  GFRAA 35* 28*  --  32*  ANIONGAP 20* 21*  --  10     Hematology Recent Labs  Lab 08/31/17 1934 09/02/17 0035  WBC 11.7* 9.0  RBC 5.25 4.56  HGB 16.6 14.2  HCT 49.4 41.9  MCV 94.1 91.9  MCH 31.6 31.1  MCHC 33.6 33.9  RDW 13.5 13.5  PLT 140* 119*    Cardiac Enzymes Recent Labs  Lab 08/31/17 1934 09/01/17 0251 09/01/17 0813  TROPONINI 0.05* 0.05* 0.04*   No results for input(s): TROPIPOC in  the last 168 hours.   BNP Recent Labs  Lab 08/31/17 1934  BNP 586.5*     DDimer No results for input(s): DDIMER in the last 168 hours.   Radiology    Dg Chest Port 1 View  Result Date: 09/01/2017 CLINICAL DATA:  83 year old male with a history of shortness of breath EXAM: PORTABLE CHEST 1 VIEW COMPARISON:  08/31/2017, 08/30/2017, CT 08/30/2017 FINDINGS: Cardiomediastinal silhouette unchanged with cardiomegaly. Fullness in the central vasculature. Thickening of the minor fissure. Interlobular septal thickening. Blunting of the right costophrenic angle with hazy opacity at the right lung base. No pneumothorax. IMPRESSION: Evidence of mild pulmonary edema and small right pleural effusion. Cardiomegaly Electronically Signed   By: Corrie Mckusick D.O.   On: 09/01/2017 10:32    Cardiac Studies   none  Patient Profile     83 y.o. male with history of CAD with remote PCI, HTN, remote CVA admitted with a 2 week history of progressive SOB and palpitations.  Apparently had gotten the flu shot 6 weeks ago and started having purulent sputum which he has continued to have.   He was admitted to [facility] and found to have a small PE and was tachycardia (initially thought to be atrial tach) and was admitted to [facility] on Lovenox. Had worsening SOB and started on BiPAP and noted to have atrial fib with RVR and started on BB.  Seen by Cards and echo showed EF 10-15% (had been 40-45%).     Assessment & Plan    1.  Acute on chronic systolic CHF - EF in the past had been 40-45% and now 10%.   - unclear of etiology of acute drop in EF.  He was tachycardic on admission and had various arrhythmias documented including ST/atrial tach/junct tach but I suspect this is all afib with RVR and may have a tachy induced DCM.  He has had palpitations for several weeks.  He also had sx of recent URI as well so could be viral in etiology.  - repeat 2D echo now that HR is controlled is pending - AHF to see patient today - discussed with Dr. Haroldine Laws - will likely get RHC today to assess filling pressures. - He has put out 1.85L on Lasix 40mg  IV BID and now net neg 1.74L.  This is not reflected in weights which are likely inaccurate. - Creatinine improved from 2.29 to 2.09 and should continue to improve with diuresis - he is off Bipap and O2 sats 100% on 2L  2.  Atrial fibrillation with RVR - by symptoms this likely has gone on for a few weeks - HR controlled on IV Amio gtt in the 90's - continue IV heparin gtt per pharmacy - may be cause of drop in EF  3.  DCM - EF had been 40-45% and now 10%.  - BB and ARB on hold due to acute decompensation  4.  ASCAD with remote stenting in the past.  He denies any recent CP - continue ASA - restart statin  5.  AKI - unsure of baseline creatinine but on admit 1.92 then up to 2.29 and now down to 2.09.  Likely a combination of acute ATN from hypotension and passive congestion from CHF. - will need to follow closely in setting of diuresis  6.  Transaminitis with elevated INR likely due to shock liver from hypotension.  Still unclear if and how  long he was hypotensive at [facility].  LFTs slowly improving this am.  Repeat  INR this am.  I have spent a total of 35 minutes with patient reviewing hospital notes , telemetry, EKGs, labs and examining patient as well as establishing an assessment and plan that was discussed with the patient.  > 50% of time was spent in direct patient care.    For questions or updates, please contact [place] Please consult [email or web address] for contact info under Cardiology/STEMI.      Signed, Fransico Him, MD  09/02/2017, 9:19 AM

## 2017-09-03 ENCOUNTER — Inpatient Hospital Stay (HOSPITAL_COMMUNITY): Payer: Medicare Other

## 2017-09-03 ENCOUNTER — Encounter (HOSPITAL_COMMUNITY)
Admission: AD | Disposition: A | Discharge status: Home Health | Payer: Self-pay | Source: Other Acute Inpatient Hospital | Attending: Internal Medicine

## 2017-09-03 DIAGNOSIS — I350 Nonrheumatic aortic (valve) stenosis: Secondary | ICD-10-CM

## 2017-09-03 HISTORY — PX: RIGHT/LEFT HEART CATH AND CORONARY ANGIOGRAPHY: CATH118266

## 2017-09-03 LAB — COMPREHENSIVE METABOLIC PANEL
ALT: 347 U/L — ABNORMAL HIGH (ref 17–63)
AST: 239 U/L — AB (ref 15–41)
Albumin: 3.4 g/dL — ABNORMAL LOW (ref 3.5–5.0)
Alkaline Phosphatase: 102 U/L (ref 38–126)
Anion gap: 9 (ref 5–15)
BUN: 42 mg/dL — AB (ref 6–20)
CO2: 27 mmol/L (ref 22–32)
Calcium: 7.9 mg/dL — ABNORMAL LOW (ref 8.9–10.3)
Chloride: 103 mmol/L (ref 101–111)
Creatinine, Ser: 1.5 mg/dL — ABNORMAL HIGH (ref 0.61–1.24)
GFR calc Af Amer: 48 mL/min — ABNORMAL LOW (ref >60)
GFR, EST NON AFRICAN AMERICAN: 41 mL/min — AB (ref >60)
Glucose, Bld: 152 mg/dL — ABNORMAL HIGH (ref 65–99)
POTASSIUM: 3.1 mmol/L — AB (ref 3.5–5.1)
Sodium: 139 mmol/L (ref 135–145)
Total Bilirubin: 1.9 mg/dL — ABNORMAL HIGH (ref 0.3–1.2)
Total Protein: 5.9 g/dL — ABNORMAL LOW (ref 6.5–8.1)

## 2017-09-03 LAB — CBC
HEMATOCRIT: 42.8 % (ref 39.0–52.0)
Hemoglobin: 14.1 g/dL (ref 13.0–17.0)
MCH: 30.7 pg (ref 26.0–34.0)
MCHC: 32.9 g/dL (ref 30.0–36.0)
MCV: 93 fL (ref 78.0–100.0)
Platelets: 110 10*3/uL — ABNORMAL LOW (ref 150–400)
RBC: 4.6 MIL/uL (ref 4.22–5.81)
RDW: 13.7 % (ref 11.5–15.5)
WBC: 6.4 10*3/uL (ref 4.0–10.5)

## 2017-09-03 LAB — URINE CULTURE: CULTURE: NO GROWTH

## 2017-09-03 LAB — ECHOCARDIOGRAM COMPLETE
AOVTI: 63.9 cm
AV Mean grad: 16 mmHg
AV pk vel: 286 cm/s
AVPG: 33 mmHg
Ao-asc: 41 cm
CHL CUP PV REG GRAD DIAS: 10 mmHg
CHL CUP RV SYS PRESS: 80 mmHg
DOP CAL AO MEAN VELOCITY: 187 cm/s
E decel time: 155 msec
FS: 11 % — AB (ref 28–44)
Height: 68 in
IVS/LV PW RATIO, ED: 0.86
LA ID, A-P, ES: 58 mm
LA vol index: 55.4 mL/m2
LA vol: 108 mL
LADIAMINDEX: 2.97 cm/m2
LAVOLA4C: 95.2 mL
LEFT ATRIUM END SYS DIAM: 58 mm
LVOT area: 3.8 cm2
LVOT diameter: 22 mm
MV Dec: 155
MV Peak grad: 6 mmHg
MV pk A vel: 42.8 m/s
MVPKEVEL: 124 m/s
P 1/2 time: 328 ms
PISA EROA: 0.11 cm2
PV Reg vel dias: 155 cm/s
PW: 14.5 mm — AB (ref 0.6–1.1)
RV TAPSE: 19.8 mm
Reg peak vel: 403 cm/s
TR max vel: 403 cm/s
VTI: 176 cm
WEIGHTICAEL: 2758.4 [oz_av]

## 2017-09-03 LAB — POCT I-STAT 3, VENOUS BLOOD GAS (G3P V)
Acid-Base Excess: 2 mmol/L (ref 0.0–2.0)
Bicarbonate: 27.1 mmol/L (ref 20.0–28.0)
O2 Saturation: 66 %
TCO2: 28 mmol/L (ref 22–32)
pCO2, Ven: 42.1 mmHg — ABNORMAL LOW (ref 44.0–60.0)
pH, Ven: 7.416 (ref 7.250–7.430)
pO2, Ven: 34 mmHg (ref 32.0–45.0)

## 2017-09-03 LAB — GLUCOSE, CAPILLARY
Glucose-Capillary: 132 mg/dL — ABNORMAL HIGH (ref 65–99)
Glucose-Capillary: 180 mg/dL — ABNORMAL HIGH (ref 65–99)
Glucose-Capillary: 128 mg/dL — ABNORMAL HIGH (ref 65–99)
Glucose-Capillary: 207 mg/dL — ABNORMAL HIGH (ref 65–99)

## 2017-09-03 LAB — PROTIME-INR
INR: 1.54
Prothrombin Time: 18.3 seconds — ABNORMAL HIGH (ref 11.4–15.2)

## 2017-09-03 LAB — MAGNESIUM: Magnesium: 1.8 mg/dL (ref 1.7–2.4)

## 2017-09-03 LAB — HEPARIN LEVEL (UNFRACTIONATED): Heparin Unfractionated: 0.16 IU/mL — ABNORMAL LOW (ref 0.30–0.70)

## 2017-09-03 LAB — CALCIUM, IONIZED: Calcium, Ionized, Serum: 4.2 mg/dL — ABNORMAL LOW (ref 4.5–5.6)

## 2017-09-03 SURGERY — RIGHT/LEFT HEART CATH AND CORONARY ANGIOGRAPHY
Anesthesia: LOCAL

## 2017-09-03 MED ORDER — POTASSIUM CHLORIDE 20 MEQ PO PACK
40.0000 meq | PACK | Freq: Once | ORAL | Status: DC
  Filled 2017-09-03: qty 2

## 2017-09-03 MED ORDER — POTASSIUM CHLORIDE CRYS ER 20 MEQ PO TBCR
40.0000 meq | EXTENDED_RELEASE_TABLET | Freq: Once | ORAL | Status: AC
  Administered 2017-09-03: 40 meq via ORAL
  Filled 2017-09-03: qty 2

## 2017-09-03 MED ORDER — VERAPAMIL HCL 2.5 MG/ML IV SOLN
INTRAVENOUS | Status: AC
  Filled 2017-09-03: qty 2

## 2017-09-03 MED ORDER — IOPAMIDOL (ISOVUE-370) INJECTION 76%
INTRAVENOUS | Status: DC | PRN
  Administered 2017-09-03: 50 mL via INTRA_ARTERIAL

## 2017-09-03 MED ORDER — HEPARIN SODIUM (PORCINE) 1000 UNIT/ML IJ SOLN
INTRAMUSCULAR | Status: AC
  Filled 2017-09-03: qty 1

## 2017-09-03 MED ORDER — HEPARIN BOLUS VIA INFUSION
2000.0000 [IU] | Freq: Once | INTRAVENOUS | Status: AC
  Administered 2017-09-03: 2000 [IU] via INTRAVENOUS
  Filled 2017-09-03: qty 2000

## 2017-09-03 MED ORDER — SODIUM CHLORIDE 0.9% FLUSH: 3.0000 mL | INTRAVENOUS | Status: DC | PRN

## 2017-09-03 MED ORDER — LIDOCAINE HCL (PF) 1 % IJ SOLN
INTRAMUSCULAR | Status: AC
  Filled 2017-09-03: qty 30

## 2017-09-03 MED ORDER — FUROSEMIDE 10 MG/ML IJ SOLN
80.0000 mg | Freq: Two times a day (BID) | INTRAMUSCULAR | Status: DC
  Administered 2017-09-03 – 2017-09-06 (×6): 80 mg via INTRAVENOUS
  Filled 2017-09-03 (×6): qty 8

## 2017-09-03 MED ORDER — ONDANSETRON HCL 4 MG/2ML IJ SOLN: 4.0000 mg | Freq: Four times a day (QID) | INTRAMUSCULAR | Status: DC | PRN

## 2017-09-03 MED ORDER — SODIUM CHLORIDE 0.9 % IV SOLN: 250.0000 mL | INTRAVENOUS | Status: DC | PRN

## 2017-09-03 MED ORDER — HEPARIN (PORCINE) IN NACL 2-0.9 UNIT/ML-% IJ SOLN
INTRAMUSCULAR | Status: AC
  Filled 2017-09-03: qty 1000

## 2017-09-03 MED ORDER — APIXABAN 5 MG PO TABS
5.0000 mg | ORAL_TABLET | Freq: Two times a day (BID) | ORAL | Status: DC
  Administered 2017-09-03 – 2017-09-07 (×8): 5 mg via ORAL
  Filled 2017-09-03 (×8): qty 1

## 2017-09-03 MED ORDER — MIDAZOLAM HCL 2 MG/2ML IJ SOLN: INTRAMUSCULAR | Status: DC | PRN

## 2017-09-03 MED ORDER — SODIUM CHLORIDE 0.9 % IV SOLN
INTRAVENOUS | Status: AC
  Administered 2017-09-03: 50 mL/h via INTRAVENOUS

## 2017-09-03 MED ORDER — ACETAMINOPHEN 325 MG PO TABS: 650.0000 mg | ORAL_TABLET | ORAL | Status: DC | PRN

## 2017-09-03 MED ORDER — IOPAMIDOL (ISOVUE-370) INJECTION 76%
INTRAVENOUS | Status: AC
  Filled 2017-09-03: qty 100

## 2017-09-03 MED ORDER — SODIUM CHLORIDE 0.9% FLUSH
3.0000 mL | Freq: Two times a day (BID) | INTRAVENOUS | Status: DC
  Administered 2017-09-05 – 2017-09-06 (×4): 3 mL via INTRAVENOUS

## 2017-09-03 MED ORDER — FENTANYL CITRATE (PF) 100 MCG/2ML IJ SOLN: INTRAMUSCULAR | Status: DC | PRN

## 2017-09-03 MED ORDER — MAGNESIUM SULFATE 2 GM/50ML IV SOLN
2.0000 g | Freq: Once | INTRAVENOUS | Status: AC
  Administered 2017-09-03: 2 g via INTRAVENOUS
  Filled 2017-09-03: qty 50

## 2017-09-03 MED ORDER — VERAPAMIL HCL 2.5 MG/ML IV SOLN
INTRAVENOUS | Status: DC | PRN
  Administered 2017-09-03: 10 mL via INTRA_ARTERIAL

## 2017-09-03 MED ORDER — HEPARIN SODIUM (PORCINE) 1000 UNIT/ML IJ SOLN
INTRAMUSCULAR | Status: DC | PRN
  Administered 2017-09-03: 4000 [IU] via INTRAVENOUS

## 2017-09-03 MED ORDER — SODIUM CHLORIDE 0.9% FLUSH: 3.0000 mL | Freq: Two times a day (BID) | INTRAVENOUS | Status: DC

## 2017-09-03 MED ORDER — SODIUM CHLORIDE 0.9 % IV SOLN: INTRAVENOUS | Status: DC

## 2017-09-03 MED ORDER — HEPARIN (PORCINE) IN NACL 2-0.9 UNIT/ML-% IJ SOLN
INTRAMUSCULAR | Status: AC | PRN
  Administered 2017-09-03: 1500 mL

## 2017-09-03 MED ORDER — LIDOCAINE HCL (PF) 1 % IJ SOLN
INTRAMUSCULAR | Status: DC | PRN
  Administered 2017-09-03 (×2): 5 mL

## 2017-09-03 MED ORDER — POTASSIUM CHLORIDE CRYS ER 20 MEQ PO TBCR
40.0000 meq | EXTENDED_RELEASE_TABLET | ORAL | Status: AC
  Administered 2017-09-03: 40 meq via ORAL
  Filled 2017-09-03: qty 2

## 2017-09-03 SURGICAL SUPPLY — 13 items
CATH BALLN WEDGE 5F 110CM (CATHETERS) ×1 IMPLANT
CATH INFINITI 5 FR JL3.5 (CATHETERS) ×2 IMPLANT
CATH INFINITI JR4 5F (CATHETERS) ×2 IMPLANT
DEVICE RAD COMP TR BAND LRG (VASCULAR PRODUCTS) ×2 IMPLANT
GLIDESHEATH SLEND SS 6F .021 (SHEATH) ×2 IMPLANT
GUIDEWIRE .025 260CM (WIRE) ×2 IMPLANT
GUIDEWIRE INQWIRE 1.5J.035X260 (WIRE) ×1 IMPLANT
INQWIRE 1.5J .035X260CM (WIRE) ×2
KIT HEART LEFT (KITS) ×2 IMPLANT
PACK CARDIAC CATHETERIZATION (CUSTOM PROCEDURE TRAY) ×2 IMPLANT
SHEATH GLIDE SLENDER 4/5FR (SHEATH) ×2 IMPLANT
TRANSDUCER W/STOPCOCK (MISCELLANEOUS) ×2 IMPLANT
TUBING CIL FLEX 10 FLL-RA (TUBING) ×2 IMPLANT

## 2017-09-03 NOTE — Progress Notes (Signed)
Advanced Heart Failure Rounding Note  PCP:  Primary Cardiologist:   Subjective:    No acute events overnight. Feeling much better today. Off of oxygen and oxygenating well. Appetite improving.   Received IV Lasix 40 BID yesterday with good urine output. Weight down 7 pounds. BP stable and renal function much improved. Cr 1.5 this AM.   NSR on amio gtt   Objective:   Weight Range: 172 lb 6.4 oz (78.2 kg) Body mass index is 26.21 kg/m.   Vital Signs:   Temp:  [97.6 F (36.4 C)-98.6 F (37 C)] 97.8 F (36.6 C) (12/20 0548) Pulse Rate:  [46-116] 62 (12/20 0400) Resp:  [19-29] 29 (12/20 0400) BP: (111-122)/(58-77) 117/58 (12/20 0400) SpO2:  [98 %-100 %] 99 % (12/20 0400) Weight:  [172 lb 6.4 oz (78.2 kg)] 172 lb 6.4 oz (78.2 kg) (12/20 0400) Last BM Date: 09/01/17  Weight change: Filed Weights   09/01/17 0644 09/02/17 0400 09/03/17 0400  Weight: 176 lb 2.4 oz (79.9 kg) 179 lb 14.3 oz (81.6 kg) 172 lb 6.4 oz (78.2 kg)    Intake/Output:   Intake/Output Summary (Last 24 hours) at 09/03/2017 0658 Last data filed at 09/03/2017 0420 Gross per 24 hour  Intake 1220.08 ml  Output 2250 ml  Net -1029.92 ml      Physical Exam    General:  Well appearing. No resp difficulty HEENT: Normal Neck: Supple. JVP 8-9 . Carotids 2+ bilat; no bruits. No lymphadenopathy or thyromegaly appreciated. Cor: PMI laterally displaced. Regular rate & rhythm. 2/6 AS murmur S2 ok  Lungs: diminished breath sounds at the bases with mild crackles  Abdomen: Soft, nontender, nondistended. No hepatosplenomegaly. No bruits or masses. Good bowel sounds. Extremities: No cyanosis, clubbing, rash, edema Neuro: Alert & orientedx3, cranial nerves grossly intact. moves all 4 extremities w/o difficulty. Affect pleasant   Telemetry   NSR 60-80 with frequent PVCs. Personally reviewed.   Labs    CBC Recent Labs    08/31/17 1934 09/02/17 0035 09/03/17 0400  WBC 11.7* 9.0 6.4  NEUTROABS 9.4*  --    --   HGB 16.6 14.2 14.1  HCT 49.4 41.9 42.8  MCV 94.1 91.9 93.0  PLT 140* 119* 283*   Basic Metabolic Panel Recent Labs    09/02/17 0035 09/02/17 0957 09/03/17 0400  NA 138  --  139  K 3.4*  --  3.1*  CL 104  --  103  CO2 24  --  27  GLUCOSE 188*  --  152*  BUN 60*  --  42*  CREATININE 2.09*  --  1.50*  CALCIUM 7.8*  --  7.9*  MG  --   --  1.8  PHOS  --  3.5  --    Liver Function Tests Recent Labs    09/02/17 0035 09/03/17 0400  AST 536* 239*  ALT 475* 347*  ALKPHOS 104 102  BILITOT 2.0* 1.9*  PROT 5.7* 5.9*  ALBUMIN 3.4* 3.4*   No results for input(s): LIPASE, AMYLASE in the last 72 hours. Cardiac Enzymes Recent Labs    08/31/17 1934 09/01/17 0251 09/01/17 0813  TROPONINI 0.05* 0.05* 0.04*    BNP: BNP (last 3 results) Recent Labs    08/31/17 1934  BNP 586.5*    ProBNP (last 3 results) No results for input(s): PROBNP in the last 8760 hours.   D-Dimer No results for input(s): DDIMER in the last 72 hours. Hemoglobin A1C Recent Labs    09/01/17 1508  HGBA1C  8.0*   Fasting Lipid Panel No results for input(s): CHOL, HDL, LDLCALC, TRIG, CHOLHDL, LDLDIRECT in the last 72 hours. Thyroid Function Tests No results for input(s): TSH, T4TOTAL, T3FREE, THYROIDAB in the last 72 hours.  Invalid input(s): FREET3  Other results:   Imaging     No results found.   Medications:     Scheduled Medications: . aspirin EC  81 mg Oral Daily  . furosemide  40 mg Intravenous BID  . insulin aspart  0-5 Units Subcutaneous QHS  . insulin aspart  0-9 Units Subcutaneous TID WC  . sodium chloride flush  3 mL Intravenous Q12H     Infusions: . sodium chloride    . amiodarone 30 mg/hr (09/03/17 0136)  . 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  Assessment/Plan  1. Acute decompensation HFrEF:Likely secondary to onset of Afib with RVR. Remains on NSR on amio gtt. Plan for RHC and LHC today given significant improvement in renal function.  TTE with EF 15% at [facility]. This was performed while he was in Afib with RVR. Will need repeat TTE now that he is rate controlled to assess LV function. Received Lasix 40 BID yesterday and diuresed well. Weight is down 7 pounds.   - RHC and LHC today  - Repeat echo today  - No beta blocker for now in the setting of acute decompensation  - No ARB/ ARNI at this time pending improvement in renal function  - IV Lasix 40 BID  - PT eval  2. Atrial fibrillation: now rate controlled and on NSR on amio gtt  - On amiodarone gtt  - On heparin gtt   3. PE: small, R -sided segmental PE seen on CTA chest. On heparin gtt.  - Continue heparin ggtt   4. Acute hypoxic respiratory failure: in the setting of acute decompensation HF. Also considering ongoing infectious process given productive cough. Pan-cx per PCCM. NGTD. Off of supplemental oxygen and oxygenating well.  - Diuresis as above  - PCCM following, follow up sputum and blood cx  - Never smoker  - Will need outpatient sleep study to evaluate for OSA   5. Transaminitis with abnormal INR and thromboytopenia: Suspect secondary to shock liver from presumed hypotension while OSH vs hepatic congestion. INR 1.97 12/18. Not on warfarin. No signs/symptoms of active bleeding.   - Trending down, continue to monitor   6. Thrombocytopenia: in the setting of liver dysfunction but also stared on heparin gtt 3 days ago. Plt 140 --> 119 --> 110 today. Platelets normal on previous CBC from 2015 - Follow up CBC closely   7. AKI: with hyperkalemia and HAGMA.  Metabolic acidosis resolved. Renal function improving.  Cr 1.5 this AM.  - Continue to monitor renal function and electrolytes in the setting of active diuresis  - Replete lytes PRN   8. T2DM: well controlled. Last A1c 8 09/01/2017.  - On SSI per primary   9. Bladder cancer: on treatment with BCG. Last treatment on 11/19 per urology note. Follows up with Dr. Rosana Hoes.   Medication concerns reviewed with patient and pharmacy team.  Length  of Stay: 3  Welford Roche, MD  09/03/2017, 6:58 AM  Advanced Heart Failure Team Pager [phone] (M-F; 7a - 4p)  Please contact [facility] for night-coverage after hours (4p -7a ) and weekends on [email or web address]  Patient seen and examined with the above-signed Advanced Practice Provider and/or Housestaff. I personally reviewed laboratory data, imaging studies and relevant notes. I independently examined the patient and formulated the important aspects of the plan. I have edited the note to reflect any of my changes or salient points. I have personally discussed the plan with the patient and/or family.  Much improved with diuresis. Remains in NSR on IV amio. Renal function improving with resolution of ATN. Will plan R/L cath today. Also needs f/u echo.   Likely can start ACE/ARB tomorrow. Suspect EF will recover with maintenance of NSR. Continue amio. Resume AC post-cath.   Glori Bickers, MD  10:13 AM

## 2017-09-03 NOTE — Progress Notes (Signed)
ANTICOAGULATION CONSULT NOTE - Follow Up Consult  Pharmacy Consult for heparin Indication: atrial fibrillation and pulmonary embolus  Labs: Recent Labs    08/31/17 1934 09/01/17 0251  09/01/17 0813  09/02/17 0035 09/02/17 0720 09/03/17 0400  HGB 16.6  --   --   --   --  14.2  --  14.1  HCT 49.4  --   --   --   --  41.9  --  42.8  PLT 140*  --   --   --   --  119*  --  110*  LABPROT 20.9* 22.3*  --   --   --   --   --   --   INR 1.82 1.97  --   --   --   --   --   --   HEPARINUNFRC  --  1.30*   < >  --    < > 0.46 0.37 0.16*  CREATININE 1.92* 2.29*  --   --   --  2.09*  --   --   TROPONINI 0.05* 0.05*  --  0.04*  --   --   --   --    < > = values in this interval not displayed.    Assessment: 83yo male subtherapeutic on heparin after two levels at goal though had been trending down.   Goal of Therapy:  Heparin level 0.3-0.7 units/ml   Plan:  Will rebolus with heparin 2000 units and increase heparin gtt by 2-3 units/kg/hr to 1000 units/hr and check level in 8 hours.   Wynona Neat, PharmD, BCPS  09/03/2017,5:18 AM

## 2017-09-03 NOTE — Progress Notes (Signed)
PROGRESS NOTE   Bernard Kim  EZM:[id number]    DOB: 1931/03/07    DOA: 08/31/2017  PCP: Marco Collie, MD     Brief Narrative:  83 year old male with a PMH of CAD status post remote PCI, CVA without residual deficits, DM 2, HLD, HTN, bladder cancer initially admitted to [facility]. He presented there with 1 month history of exertional dyspnea and found to have small pulmonary embolism, probable mildly decompensated systolic heart failure, atrial fibrillation. He was being appropriately treated but then developed worsening dyspnea with periods of apnea requiring BiPAP in ICU. Cardiology was consulted regarding unstable angina due to transient ST changes in inferior leads, EF 15-20 percent (40-45 percent prior) and new onset systolic CHF. Cardiology recommended transfer to [facility] for left heart cath. Cardiology and pulmonology consulted.  Patient weaned off of BiPAP.  Currently doing well on amiodarone.   Assessment & Plan:   Principal Problem:   Acute respiratory failure with hypoxia (HCC) Active Problems:   Coronary artery disease   Diabetes mellitus, type 2 (HCC)   Essential hypertension   Hypertension   Non-rheumatic mitral regurgitation   Dyspnea   Acute CHF (congestive heart failure) (HCC)   Pulmonary embolism (HCC)   Apneic episode   Paroxysmal A-fib (HCC)   Atrial fibrillation with RVR (HCC)   Transaminitis   1. Acute on chronic systolic CHF/cardiomyopathy: Cardiology input appreciated. EF in the past: 40-45 percent. Now 10%. Unclear etiology. Could be rate related from A. fib but need to rule out ischemic etiology. Cardiology started IV Lasix 40 mg twice a day, checking 2-D echo now that the heart rate is better controlled and asking advanced heart failure team to see with ultimate goal of R/L heart When renal functions stabilized. No ACEI/ARB due to renal insufficiency and beta blockers due to acute decompensation. 2. A. fib with RVR: As per cardiology, based upon symptoms  probably has had it for a few weeks. Heart rate now controlled on IV amiodarone and on IV heparin anticoagulation.  3. CAD status post stenting: Continue aspirin. R/L At some point. Cardiology following. 4. Acute respiratory failure with hypoxia/apneic spells: Progressive dyspnea for 6 weeks, reportedly started after receiving flu shot. Nonsmoker. Was on BiPAP when transferred from [facility]. Off BiPAP. [facility] oxygen and maintained saturations >92%. Pulmonary consulted. Are apneic spells Chyne Stokes respiration? Does not have body habitus for OSA.  Pulm to coordinate sleep study at discharge. 5. Acute pulmonary embolism: Noted at [facility]. Apparently very small and would not explain all of his respiratory symptoms. Currently on IV heparin drip. 6. Acute kidney injury/hyperkalemia: Baseline creatinine not known. Presented with creatinine of 1.9 to which increased to 2.29-->2.09-->1.5. Likely due to hypoxia. 7. Anion gap metabolic acidosis: Better with AKI improvement. 8. Elevated INR/abnormal LFTs: Hepatitis picture.? Related to hepatic passive congestion from CHF but doesn't look significantly volume overloaded versus not sure if he came in with hypotension initially. Also on amiodarone. Follow LFTs in AM. 9. Type II DM: SSI.  10. Essential hypertension: Mildly uncontrolled. Monitor for now on IV Lasix. 11. Thrombocytopenia:? Acute illness. Also on IV heparin. Follow CBCs closely. Lower today. 12. Electrolyte abn: Replace and recheck in AM   DVT prophylaxis: IV heparin drip Code Status: Full Family Communication: Discussed in detail with patient's son at bedside. Disposition: To be determined.   Consultants:  Cardiology Pulmonology   Procedures:  BiPAP prn  Antimicrobials:  None    Subjective: Seen this morning. Doing well. No complaints today.  ROS: As above.  Objective:  Vitals:   09/03/17 0548 09/03/17 1113 09/03/17 1603 09/03/17 1747  BP:  110/75  116/79  Pulse:  65 72  68  Resp:  (!) 26  (!) 23  Temp: 97.8 F (36.6 C) 98.1 F (36.7 C) 98 F (36.7 C) 98.4 F (36.9 C)  TempSrc: Oral Oral Oral Oral  SpO2:  96% 98% 93%  Weight:      Height:        Examination:  General exam: Elderly male, moderately built and nourished, edentulous, A&Ox3 Respiratory system: CTAB. Respiratory effort normal. Cardiovascular system: S1 & S2 heard, RRR. No JVD, murmurs, rubs, gallops or clicks. Trace ankle edema. Afib, rate controlled. Gastrointestinal system: Abdomen is nondistended, BS+ Central nervous system: Alert and oriented. No focal neurological deficits. Extremities: Symmetric 5 x 5 power. Skin: No rashes, lesions or ulcers Psychiatry: Judgement and insight appear normal. Mood & affect appropriate.     Data Reviewed: I have personally reviewed following labs and imaging studies  CBC: Recent Labs  Lab 08/31/17 1934 09/02/17 0035 09/03/17 0400  WBC 11.7* 9.0 6.4  NEUTROABS 9.4*  --   --   HGB 16.6 14.2 14.1  HCT 49.4 41.9 42.8  MCV 94.1 91.9 93.0  PLT 140* 119* 833*   Basic Metabolic Panel: Recent Labs  Lab 08/31/17 1934 09/01/17 0251 09/02/17 0035 09/02/17 0957 09/03/17 0400  NA 136 138 138  --  139  K 5.2* 4.9 3.4*  --  3.1*  CL 102 102 104  --  103  CO2 14* 15* 24  --  27  GLUCOSE 228* 233* 188*  --  152*  BUN 39* 48* 60*  --  42*  CREATININE 1.92* 2.29* 2.09*  --  1.50*  CALCIUM 8.5* 8.3* 7.8*  --  7.9*  MG  --   --   --   --  1.8  PHOS  --   --   --  3.5  --    Liver Function Tests: Recent Labs  Lab 08/31/17 1934 09/01/17 1112 09/02/17 0035 09/03/17 0400  AST 163* 742* 536* 239*  ALT 122* 438* 475* 347*  ALKPHOS 109 108 104 102  BILITOT 2.7* 1.8* 2.0* 1.9*  PROT 6.1* 6.0* 5.7* 5.9*  ALBUMIN 3.6 3.5 3.4* 3.4*   Coagulation Profile: Recent Labs  Lab 08/31/17 1934 09/01/17 0251 09/03/17 1103  INR 1.82 1.97 1.54   Cardiac Enzymes: Recent Labs  Lab 08/31/17 1934 09/01/17 0251 09/01/17 0813  TROPONINI 0.05* 0.05*  0.04*   HbA1C: Recent Labs    09/01/17 1508  HGBA1C 8.0*   CBG: Recent Labs  Lab 09/02/17 1613 09/02/17 2101 09/03/17 0612 09/03/17 1116 09/03/17 1553  GLUCAP 196* 185* 132* 180* 128*    Recent Results (from the past 240 hour(s))  Culture, blood (Routine X 2) w Reflex to ID Panel     Status: None (Preliminary result)   Collection Time: 09/01/17 11:27 AM  Result Value Ref Range Status   Specimen Description BLOOD LEFT ANTECUBITAL  Final   Special Requests   Final    BOTTLES DRAWN AEROBIC ONLY Blood Culture adequate volume   Culture NO GROWTH 2 DAYS  Final   Report Status PENDING  Incomplete  Culture, blood (Routine X 2) w Reflex to ID Panel     Status: None (Preliminary result)   Collection Time: 09/01/17 11:31 AM  Result Value Ref Range Status   Specimen Description BLOOD LEFT ARM  Final   Special Requests  Final    BOTTLES DRAWN AEROBIC ONLY Blood Culture adequate volume   Culture NO GROWTH 2 DAYS  Final   Report Status PENDING  Incomplete  Culture, expectorated sputum-assessment     Status: None   Collection Time: 09/01/17  1:04 PM  Result Value Ref Range Status   Specimen Description SPUTUM  Final   Special Requests Immunocompromised  Final   Sputum evaluation   Final    Sputum specimen not acceptable for testing.  Please recollect.   Gram Stain Report Called to,Read Back By and Verified With: RN Dola Factor [phone] MLM    Report Status 09/01/2017 FINAL  Final  Culture, Urine     Status: None   Collection Time: 09/02/17  4:46 AM  Result Value Ref Range Status   Specimen Description URINE, CATHETERIZED  Final   Special Requests NONE  Final   Culture NO GROWTH  Final   Report Status 09/03/2017 FINAL  Final         Radiology Studies: Dg Chest 2 View  Result Date: 09/03/2017 CLINICAL DATA:  Shortness of Breath EXAM: CHEST  2 VIEW COMPARISON:  09/01/2017 FINDINGS: Cardiomegaly with vascular congestion. Small bilateral effusions with bibasilar  atelectasis. No acute bony abnormality. IMPRESSION: Cardiomegaly, vascular congestion. Bibasilar atelectasis and small effusions. Electronically Signed   By: Rolm Baptise M.D.   On: 09/03/2017 08:28        Scheduled Meds: . apixaban  5 mg Oral BID  . aspirin EC  81 mg Oral Daily  . furosemide  80 mg Intravenous BID  . insulin aspart  0-5 Units Subcutaneous QHS  . insulin aspart  0-9 Units Subcutaneous TID WC  . sodium chloride flush  3 mL Intravenous Q12H  . sodium chloride flush  3 mL Intravenous Q12H   Continuous Infusions: . sodium chloride    . sodium chloride    . sodium chloride    . amiodarone 30 mg/hr (09/03/17 1332)     LOS: 3 days     Elwin Mocha, MD [facility] Pager AMION  If 7PM-7AM, please contact night-coverage [email or web address] Password [facility] 09/03/2017, 6:19 PM

## 2017-09-03 NOTE — Progress Notes (Signed)
Pt placed on CPAP for the night.  Pt tolerating well at this time.

## 2017-09-03 NOTE — Progress Notes (Signed)
  Echocardiogram 2D Echocardiogram has been performed.  Bernard Kim 09/03/2017, 1:48 PM

## 2017-09-03 NOTE — H&P (View-Only) (Signed)
Advanced Heart Failure Rounding Note  PCP:  Primary Cardiologist:   Subjective:    No acute events overnight. Feeling much better today. Off of oxygen and oxygenating well. Appetite improving.   Received IV Lasix 40 BID yesterday with good urine output. Weight down 7 pounds. BP stable and renal function much improved. Cr 1.5 this AM.   NSR on amio gtt   Objective:   Weight Range: 172 lb 6.4 oz (78.2 kg) Body mass index is 26.21 kg/m.   Vital Signs:   Temp:  [97.6 F (36.4 C)-98.6 F (37 C)] 97.8 F (36.6 C) (12/20 0548) Pulse Rate:  [46-116] 62 (12/20 0400) Resp:  [19-29] 29 (12/20 0400) BP: (111-122)/(58-77) 117/58 (12/20 0400) SpO2:  [98 %-100 %] 99 % (12/20 0400) Weight:  [172 lb 6.4 oz (78.2 kg)] 172 lb 6.4 oz (78.2 kg) (12/20 0400) Last BM Date: 09/01/17  Weight change: Filed Weights   09/01/17 0644 09/02/17 0400 09/03/17 0400  Weight: 176 lb 2.4 oz (79.9 kg) 179 lb 14.3 oz (81.6 kg) 172 lb 6.4 oz (78.2 kg)    Intake/Output:   Intake/Output Summary (Last 24 hours) at 09/03/2017 0658 Last data filed at 09/03/2017 0420 Gross per 24 hour  Intake 1220.08 ml  Output 2250 ml  Net -1029.92 ml      Physical Exam    General:  Well appearing. No resp difficulty HEENT: Normal Neck: Supple. JVP 8-9 . Carotids 2+ bilat; no bruits. No lymphadenopathy or thyromegaly appreciated. Cor: PMI laterally displaced. Regular rate & rhythm. 2/6 AS murmur S2 ok  Lungs: diminished breath sounds at the bases with mild crackles  Abdomen: Soft, nontender, nondistended. No hepatosplenomegaly. No bruits or masses. Good bowel sounds. Extremities: No cyanosis, clubbing, rash, edema Neuro: Alert & orientedx3, cranial nerves grossly intact. moves all 4 extremities w/o difficulty. Affect pleasant   Telemetry   NSR 60-80 with frequent PVCs. Personally reviewed.   Labs    CBC Recent Labs    08/31/17 1934 09/02/17 0035 09/03/17 0400  WBC 11.7* 9.0 6.4  NEUTROABS 9.4*  --    --   HGB 16.6 14.2 14.1  HCT 49.4 41.9 42.8  MCV 94.1 91.9 93.0  PLT 140* 119* 314*   Basic Metabolic Panel Recent Labs    09/02/17 0035 09/02/17 0957 09/03/17 0400  NA 138  --  139  K 3.4*  --  3.1*  CL 104  --  103  CO2 24  --  27  GLUCOSE 188*  --  152*  BUN 60*  --  42*  CREATININE 2.09*  --  1.50*  CALCIUM 7.8*  --  7.9*  MG  --   --  1.8  PHOS  --  3.5  --    Liver Function Tests Recent Labs    09/02/17 0035 09/03/17 0400  AST 536* 239*  ALT 475* 347*  ALKPHOS 104 102  BILITOT 2.0* 1.9*  PROT 5.7* 5.9*  ALBUMIN 3.4* 3.4*   No results for input(s): LIPASE, AMYLASE in the last 72 hours. Cardiac Enzymes Recent Labs    08/31/17 1934 09/01/17 0251 09/01/17 0813  TROPONINI 0.05* 0.05* 0.04*    BNP: BNP (last 3 results) Recent Labs    08/31/17 1934  BNP 586.5*    ProBNP (last 3 results) No results for input(s): PROBNP in the last 8760 hours.   D-Dimer No results for input(s): DDIMER in the last 72 hours. Hemoglobin A1C Recent Labs    09/01/17 1508  HGBA1C  8.0*   Fasting Lipid Panel No results for input(s): CHOL, HDL, LDLCALC, TRIG, CHOLHDL, LDLDIRECT in the last 72 hours. Thyroid Function Tests No results for input(s): TSH, T4TOTAL, T3FREE, THYROIDAB in the last 72 hours.  Invalid input(s): FREET3  Other results:   Imaging     No results found.   Medications:     Scheduled Medications: . aspirin EC  81 mg Oral Daily  . furosemide  40 mg Intravenous BID  . insulin aspart  0-5 Units Subcutaneous QHS  . insulin aspart  0-9 Units Subcutaneous TID WC  . sodium chloride flush  3 mL Intravenous Q12H     Infusions: . sodium chloride    . amiodarone 30 mg/hr (09/03/17 0136)  . 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  Assessment/Plan  1. Acute decompensation HFrEF:Likely secondary to onset of Afib with RVR. Remains on NSR on amio gtt. Plan for RHC and LHC today given significant improvement in renal function.  TTE with EF 15% at [facility]. This was performed while he was in Afib with RVR. Will need repeat TTE now that he is rate controlled to assess LV function. Received Lasix 40 BID yesterday and diuresed well. Weight is down 7 pounds.   - RHC and LHC today  - Repeat echo today  - No beta blocker for now in the setting of acute decompensation  - No ARB/ ARNI at this time pending improvement in renal function  - IV Lasix 40 BID  - PT eval  2. Atrial fibrillation: now rate controlled and on NSR on amio gtt  - On amiodarone gtt  - On heparin gtt   3. PE: small, R -sided segmental PE seen on CTA chest. On heparin gtt.  - Continue heparin ggtt   4. Acute hypoxic respiratory failure: in the setting of acute decompensation HF. Also considering ongoing infectious process given productive cough. Pan-cx per PCCM. NGTD. Off of supplemental oxygen and oxygenating well.  - Diuresis as above  - PCCM following, follow up sputum and blood cx  - Never smoker  - Will need outpatient sleep study to evaluate for OSA   5. Transaminitis with abnormal INR and thromboytopenia: Suspect secondary to shock liver from presumed hypotension while OSH vs hepatic congestion. INR 1.97 12/18. Not on warfarin. No signs/symptoms of active bleeding.   - Trending down, continue to monitor   6. Thrombocytopenia: in the setting of liver dysfunction but also stared on heparin gtt 3 days ago. Plt 140 --> 119 --> 110 today. Platelets normal on previous CBC from 2015 - Follow up CBC closely   7. AKI: with hyperkalemia and HAGMA.  Metabolic acidosis resolved. Renal function improving.  Cr 1.5 this AM.  - Continue to monitor renal function and electrolytes in the setting of active diuresis  - Replete lytes PRN   8. T2DM: well controlled. Last A1c 8 09/01/2017.  - On SSI per primary   9. Bladder cancer: on treatment with BCG. Last treatment on 11/19 per urology note. Follows up with Dr. Rosana Hoes.   Medication concerns reviewed with patient and pharmacy team.  Length  of Stay: 3  Bernard Roche, MD  09/03/2017, 6:58 AM  Advanced Heart Failure Team Pager [phone] (M-F; 7a - 4p)  Please contact [facility] for night-coverage after hours (4p -7a ) and weekends on [email or web address]  Patient seen and examined with the above-signed Advanced Practice Provider and/or Housestaff. I personally reviewed laboratory data, imaging studies and relevant notes. I independently examined the patient and formulated the important aspects of the plan. I have edited the note to reflect any of my changes or salient points. I have personally discussed the plan with the patient and/or family.  Much improved with diuresis. Remains in NSR on IV amio. Renal function improving with resolution of ATN. Will plan R/L cath today. Also needs f/u echo.   Likely can start ACE/ARB tomorrow. Suspect EF will recover with maintenance of NSR. Continue amio. Resume AC post-cath.   Glori Bickers, MD  10:13 AM

## 2017-09-03 NOTE — Interval H&P Note (Signed)
History and Physical Interval Note:  09/03/2017 10:17 AM  Bernard Kim  has presented today for surgery, with the diagnosis of heart failure  The various methods of treatment have been discussed with the patient and family. After consideration of risks, benefits and other options for treatment, the patient has consented to  Procedure(s): RIGHT/LEFT HEART CATH AND CORONARY ANGIOGRAPHY (N/A) and possible coronary angioplasty as a surgical intervention .  The patient's history has been reviewed, patient examined, no change in status, stable for surgery.  I have reviewed the patient's chart and labs.  Questions were answered to the patient's satisfaction.     Daniel Bensimhon

## 2017-09-03 NOTE — Progress Notes (Signed)
Patient's HR occasionally drops in 30s. HR rebounds to 60s. Patient in and out of afib. Patient denies SOB/CP. Notified on call cardiology. No new orders at this time. Will continue to monitor.

## 2017-09-04 ENCOUNTER — Encounter (HOSPITAL_COMMUNITY): Payer: Self-pay | Admitting: Internal Medicine

## 2017-09-04 LAB — POCT I-STAT 3, ART BLOOD GAS (G3+)
Acid-Base Excess: 1 mmol/L (ref 0.0–2.0)
Bicarbonate: 24.9 mmol/L (ref 20.0–28.0)
O2 Saturation: 99 %
TCO2: 26 mmol/L (ref 22–32)
pCO2 arterial: 36.3 mmHg (ref 32.0–48.0)
pH, Arterial: 7.445 (ref 7.350–7.450)
pO2, Arterial: 111 mmHg — ABNORMAL HIGH (ref 83.0–108.0)

## 2017-09-04 LAB — CBC
HCT: 41.1 % (ref 39.0–52.0)
HEMOGLOBIN: 13.9 g/dL (ref 13.0–17.0)
MCH: 31.1 pg (ref 26.0–34.0)
MCHC: 33.8 g/dL (ref 30.0–36.0)
MCV: 91.9 fL (ref 78.0–100.0)
PLATELETS: 104 10*3/uL — AB (ref 150–400)
RBC: 4.47 MIL/uL (ref 4.22–5.81)
RDW: 13.4 % (ref 11.5–15.5)
WBC: 5.8 10*3/uL (ref 4.0–10.5)

## 2017-09-04 LAB — POCT I-STAT 3, VENOUS BLOOD GAS (G3P V)
Acid-Base Excess: 1 mmol/L (ref 0.0–2.0)
Bicarbonate: 25.4 mmol/L (ref 20.0–28.0)
O2 Saturation: 67 %
TCO2: 27 mmol/L (ref 22–32)
pCO2, Ven: 40.5 mmHg — ABNORMAL LOW (ref 44.0–60.0)
pH, Ven: 7.406 (ref 7.250–7.430)
pO2, Ven: 34 mmHg (ref 32.0–45.0)

## 2017-09-04 LAB — GLUCOSE, CAPILLARY
Glucose-Capillary: 145 mg/dL — ABNORMAL HIGH (ref 65–99)
Glucose-Capillary: 184 mg/dL — ABNORMAL HIGH (ref 65–99)
Glucose-Capillary: 187 mg/dL — ABNORMAL HIGH (ref 65–99)
Glucose-Capillary: 193 mg/dL — ABNORMAL HIGH (ref 65–99)

## 2017-09-04 LAB — COMPREHENSIVE METABOLIC PANEL
ALBUMIN: 3.4 g/dL — AB (ref 3.5–5.0)
ALK PHOS: 96 U/L (ref 38–126)
ALT: 283 U/L — ABNORMAL HIGH (ref 17–63)
ANION GAP: 9 (ref 5–15)
AST: 162 U/L — ABNORMAL HIGH (ref 15–41)
BUN: 29 mg/dL — ABNORMAL HIGH (ref 6–20)
CHLORIDE: 103 mmol/L (ref 101–111)
CO2: 27 mmol/L (ref 22–32)
Calcium: 8.1 mg/dL — ABNORMAL LOW (ref 8.9–10.3)
Creatinine, Ser: 1.45 mg/dL — ABNORMAL HIGH (ref 0.61–1.24)
GFR calc non Af Amer: 43 mL/min — ABNORMAL LOW (ref >60)
GFR, EST AFRICAN AMERICAN: 49 mL/min — AB (ref >60)
GLUCOSE: 154 mg/dL — AB (ref 65–99)
POTASSIUM: 3.4 mmol/L — AB (ref 3.5–5.1)
SODIUM: 139 mmol/L (ref 135–145)
Total Bilirubin: 1.7 mg/dL — ABNORMAL HIGH (ref 0.3–1.2)
Total Protein: 5.8 g/dL — ABNORMAL LOW (ref 6.5–8.1)

## 2017-09-04 MED ORDER — AMIODARONE LOAD VIA INFUSION
150.0000 mg | Freq: Once | INTRAVENOUS | Status: AC
  Administered 2017-09-04: 150 mg via INTRAVENOUS
  Filled 2017-09-04: qty 83.34

## 2017-09-04 MED ORDER — SPIRONOLACTONE 12.5 MG HALF TABLET
12.5000 mg | ORAL_TABLET | Freq: Every day | ORAL | Status: DC
  Administered 2017-09-05: 12.5 mg via ORAL
  Filled 2017-09-04 (×3): qty 1

## 2017-09-04 MED ORDER — POTASSIUM CHLORIDE CRYS ER 20 MEQ PO TBCR
40.0000 meq | EXTENDED_RELEASE_TABLET | Freq: Two times a day (BID) | ORAL | Status: AC
  Administered 2017-09-04 (×2): 40 meq via ORAL
  Filled 2017-09-04 (×2): qty 2

## 2017-09-04 NOTE — Evaluation (Signed)
Physical Therapy Evaluation Patient Details Name: Bernard Kim MRN: [id number] DOB: 27-Jun-1931 Today's Date: 09/04/2017   History of Present Illness  83 year old male who was transferred from [facility] with new onset of ejection fraction 15%, considerable and progressive SOB, and under consideration for cardiac cath. PMH CAD, HTN, history apnea, DM, mitral regurg, A-fib, hx bladder cancer, PE treated with eliquis   Clinical Impression   Patient received up in chair with family present, pleasant and willing to work with skilled PT services this morning. Patient able to complete all functional mobility with min guard and verbal cues for hand placement today, able to ambulate approximately 25f on room air with O2 staying in the 90s; note one time drop to 88-89% but this quickly rebounded to 97% on room air with pursed lip breathing. Patient will benefit from ongoing skilled PT care in the hospital setting, and appears appropriate for HHPT moving forward- education provided accordingly. Patient left up in chair with family present, all needs met and questions addressed this morning.     Follow Up Recommendations Home health PT    Equipment Recommendations  Rolling walker with 5" wheels    Recommendations for Other Services       Precautions / Restrictions Precautions Precautions: Fall Restrictions Weight Bearing Restrictions: No      Mobility  Bed Mobility               General bed mobility comments: DNT, received up in chair   Transfers Overall transfer level: Needs assistance Equipment used: Rolling walker (2 wheeled) Transfers: Sit to/from Stand Sit to Stand: Min guard         General transfer comment: cues for hand placement   Ambulation/Gait Ambulation/Gait assistance: Min guard Ambulation Distance (Feet): 80 Feet Assistive device: Rolling walker (2 wheeled) Gait Pattern/deviations: WFL(Within Functional Limits)     General Gait Details: patient  steady with gait with assistive device, O2 on room air remained in the 90s with one time/short lived drop to 88-89% but quickly recovered to 97% with pursed lip breathing, no shortness of breath noted   Stairs            Wheelchair Mobility    Modified Rankin (Stroke Patients Only)       Balance Overall balance assessment: Needs assistance Sitting-balance support: Feet supported;No upper extremity supported Sitting balance-Leahy Scale: Good     Standing balance support: Bilateral upper extremity supported Standing balance-Leahy Scale: Good                               Pertinent Vitals/Pain Pain Assessment: No/denies pain    Home Living Family/patient expects to be discharged to:: Private residence Living Arrangements: Spouse/significant other Available Help at Discharge: Family Type of Home: House Home Access: Stairs to enter Entrance Stairs-Rails: None Entrance Stairs-Number of Steps: 2 to enter, will need to do flight of steps to get to basement  Home Layout: Two level Home Equipment: Walker - 2 wheels;Cane - single point      Prior Function Level of Independence: Independent               Hand Dominance        Extremity/Trunk Assessment   Upper Extremity Assessment Upper Extremity Assessment: Overall WFL for tasks assessed    Lower Extremity Assessment Lower Extremity Assessment: Overall WFL for tasks assessed    Cervical / Trunk Assessment Cervical / Trunk Assessment: Normal  Communication   Communication: HOH  Cognition Arousal/Alertness: Awake/alert Behavior During Therapy: WFL for tasks assessed/performed Overall Cognitive Status: Within Functional Limits for tasks assessed                                        General Comments      Exercises     Assessment/Plan    PT Assessment Patient needs continued PT services  PT Problem List Decreased mobility;Decreased coordination;Decreased  strength;Decreased safety awareness       PT Treatment Interventions DME instruction;Therapeutic activities;Gait training;Therapeutic exercise;Patient/family education;Stair training;Balance training;Functional mobility training;Neuromuscular re-education    PT Goals (Current goals can be found in the Care Plan section)  Acute Rehab PT Goals Patient Stated Goal: to get better and go home  PT Goal Formulation: With patient/family Time For Goal Achievement: 09/11/17 Potential to Achieve Goals: Good    Frequency Min 2X/week   Barriers to discharge        Co-evaluation               AM-PAC PT "6 Clicks" Daily Activity  Outcome Measure Difficulty turning over in bed (including adjusting bedclothes, sheets and blankets)?: None Difficulty moving from lying on back to sitting on the side of the bed? : None Difficulty sitting down on and standing up from a chair with arms (e.g., wheelchair, bedside commode, etc,.)?: None Help needed moving to and from a bed to chair (including a wheelchair)?: None Help needed walking in hospital room?: A Little Help needed climbing 3-5 steps with a railing? : A Little 6 Click Score: 22    End of Session   Activity Tolerance: Patient tolerated treatment well Patient left: in chair;with family/visitor present;with call bell/phone within reach Nurse Communication: Other (comment);Mobility status(CNA education on patient mobility ) PT Visit Diagnosis: Muscle weakness (generalized) (M62.81);Unsteadiness on feet (R26.81)    Time: 8295-6213 PT Time Calculation (min) (ACUTE ONLY): 33 min   Charges:   PT Evaluation $PT Eval Low Complexity: 1 Low PT Treatments $Self Care/Home Management: 8-22   PT G Codes:   PT G-Codes **NOT FOR INPATIENT CLASS** Functional Assessment Tool Used: AM-PAC 6 Clicks Basic Mobility;Clinical judgement Functional Limitation: Mobility: Walking and moving around Mobility: Walking and Moving Around Current Status (Y8657):  At least 20 percent but less than 40 percent impaired, limited or restricted Mobility: Walking and Moving Around Goal Status [phone]): At least 1 percent but less than 20 percent impaired, limited or restricted    Deniece Ree PT, DPT, CBIS  Supplemental Physical Therapist [facility]

## 2017-09-04 NOTE — Progress Notes (Signed)
[facility] PROGRESS NOTE  Bernard Kim GEZ:[id number] DOB: [birth year] DOA: 08/31/2017  PCP: Marco Collie, MD  Brief History/Interval Summary: 83 year old Caucasian male with past medical history of coronary artery disease status post remote PCI, history of stroke, diabetes mellitus type 2, hypertension, bladder cancer, initially admitted to [facility] with complains of shortness of breath.  Found to have decompensated systolic CHF along with atrial fibrillation.  Transferred here due to unstable angina.  Patient also had respiratory decompensation requiring BiPAP.  Patient underwent cardiac catheterization.  Seen by pulmonology.  Reason for Visit: Acute on chronic systolic CHF.  Atrial fibrillation.  Consultants: Cardiology.  Pulmonology.  Procedures:   Transthoracic echocardiogram Study Conclusions  - Left ventricle: The cavity size was normal. There was moderate   concentric hypertrophy. Systolic function was severely reduced.   The estimated ejection fraction was in the range of 25% to 30%.   Diffuse hypokinesis wose in the anterior myocardium. - Aortic valve: Valve mobility was restricted. There was mild   stenosis. There was moderate regurgitation. Peak velocity (S):   286 cm/s. Mean gradient (S): 16 mm Hg. - Mitral valve: Transvalvular velocity was within the normal range.   There was no evidence for stenosis. There was mild to moderate   regurgitation. - Left atrium: The atrium was severely dilated. - Right ventricle: The cavity size was normal. Wall thickness was   normal. Systolic function was normal. - Right atrium: The atrium was mildly dilated. - Tricuspid valve: There was moderate regurgitation. - Pulmonary arteries: Systolic pressure was severely increased. PA   peak pressure: 80 mm Hg (S). - Pericardium, extracardiac: A trivial pericardial effusion was   identified.  Impressions:  - According to the aortic valve gradient, aortic stenosis is  mild.   However, given his reduced systolic function and restricted   aortic valve mobiliy, cannot rule out low-flow, low-gradient   aortic stenosis. Consider dobutamine stress echo if clinically   indicated.  Cardiac catheterization Conclusion     Prox RCA lesion is 40% stenosed.  Mid RCA lesion is 30% stenosed.  Dist RCA lesion is 60% stenosed.  Post Atrio lesion is 85% stenosed.  Mid LM to Dist LM lesion is 40% stenosed.  Prox LAD lesion is 60% stenosed.  Ost 1st Diag to 1st Diag lesion is 30% stenosed.  Prox Cx to Mid Cx lesion is 20% stenosed.   Findings:  Ao = 121/58 (82)  LV =  146/29 RA = 12 RV = 56/14 PA = 57/21 (33) PCW = 31 (v=45) Fick cardiac output/index = 4.2/2.2 PVR = < 1.0 WU SVR = 1325 Ao sat = 99% PA sat = 66%, 67%  AoV peak-to-peak gradient = 53mmHG  Assessment: 1. 3v CAD with high-grade lesion in distal RCA otherwise non-obstructive disease 2. 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 Elevated filling pressures with prominent v-waves in PCWP tracing suggestive of mitral regurgitation     Antibiotics: None  Subjective/Interval History: Patient states that he is feeling better.  Less short of breath compared to before.  Denies any chest pain.  No nausea vomiting.  ROS: Denies any headache  Objective:  Vital Signs  Vitals:   09/03/17 2300 09/04/17 0000 09/04/17 0400 09/04/17 0800  BP:  (!) 108/59 127/90 124/86  Pulse: 63 (!) 41 68 77  Resp: (!) 23 18 20    Temp:  98.6 F (37 C) 98.2 F (36.8 C)   TempSrc:  Oral Oral   SpO2: 94%  95% 90% 96%  Weight:   78 kg (171 lb 15.3 oz)   Height:        Intake/Output Summary (Last 24 hours) at 09/04/2017 1151 Last data filed at 09/04/2017 1058 Gross per 24 hour  Intake 12110 ml  Output 4300 ml  Net 7810 ml   Filed Weights   09/02/17 0400 09/03/17 0400 09/04/17 0400  Weight: 81.6 kg (179 lb 14.3 oz) 78.2 kg (172 lb 6.4 oz) 78 kg (171 lb 15.3 oz)     General appearance: alert, cooperative, appears stated age and no distress Head: Normocephalic, without obvious abnormality, atraumatic Resp: Continues to have crackles at the bases.  No wheezing.  No rhonchi.  Mildly tachypneic. Cardio: S1-S2 is irregularly irregular.  No S3-S4.  Systolic murmur appreciated over the precordium.  Lower extremity edema is present. GI: soft, non-tender; bowel sounds normal; no masses,  no organomegaly Extremities: edema 1+ Neurologic: No obvious focal neurological deficits.  Lab Results:  Data Reviewed: I have personally reviewed following labs and imaging studies  CBC: Recent Labs  Lab 08/31/17 1934 09/02/17 0035 09/03/17 0400 09/04/17 0352  WBC 11.7* 9.0 6.4 5.8  NEUTROABS 9.4*  --   --   --   HGB 16.6 14.2 14.1 13.9  HCT 49.4 41.9 42.8 41.1  MCV 94.1 91.9 93.0 91.9  PLT 140* 119* 110* 104*    Basic Metabolic Panel: Recent Labs  Lab 08/31/17 1934 09/01/17 0251 09/02/17 0035 09/02/17 0957 09/03/17 0400 09/04/17 0352  NA 136 138 138  --  139 139  K 5.2* 4.9 3.4*  --  3.1* 3.4*  CL 102 102 104  --  103 103  CO2 14* 15* 24  --  27 27  GLUCOSE 228* 233* 188*  --  152* 154*  BUN 39* 48* 60*  --  42* 29*  CREATININE 1.92* 2.29* 2.09*  --  1.50* 1.45*  CALCIUM 8.5* 8.3* 7.8*  --  7.9* 8.1*  MG  --   --   --   --  1.8  --   PHOS  --   --   --  3.5  --   --     GFR: Estimated Creatinine Clearance: 36.7 mL/min (A) (by C-G formula based on SCr of 1.45 mg/dL (H)).  Liver Function Tests: Recent Labs  Lab 08/31/17 1934 09/01/17 1112 09/02/17 0035 09/03/17 0400 09/04/17 0352  AST 163* 742* 536* 239* 162*  ALT 122* 438* 475* 347* 283*  ALKPHOS 109 108 104 102 96  BILITOT 2.7* 1.8* 2.0* 1.9* 1.7*  PROT 6.1* 6.0* 5.7* 5.9* 5.8*  ALBUMIN 3.6 3.5 3.4* 3.4* 3.4*     Coagulation Profile: Recent Labs  Lab 08/31/17 1934 09/01/17 0251 09/03/17 1103  INR 1.82 1.97 1.54    Cardiac Enzymes: Recent Labs  Lab 08/31/17 1934  09/01/17 0251 09/01/17 0813  TROPONINI 0.05* 0.05* 0.04*    HbA1C: Recent Labs    09/01/17 1508  HGBA1C 8.0*    CBG: Recent Labs  Lab 09/03/17 1116 09/03/17 1553 09/03/17 2115 09/04/17 0602 09/04/17 1138  GLUCAP 180* 128* 207* 145* 184*    Recent Results (from the past 240 hour(s))  Culture, blood (Routine X 2) w Reflex to ID Panel     Status: None (Preliminary result)   Collection Time: 09/01/17 11:27 AM  Result Value Ref Range Status   Specimen Description BLOOD LEFT ANTECUBITAL  Final   Special Requests   Final    BOTTLES DRAWN AEROBIC ONLY Blood Culture  adequate volume   Culture NO GROWTH 2 DAYS  Final   Report Status PENDING  Incomplete  Culture, blood (Routine X 2) w Reflex to ID Panel     Status: None (Preliminary result)   Collection Time: 09/01/17 11:31 AM  Result Value Ref Range Status   Specimen Description BLOOD LEFT ARM  Final   Special Requests   Final    BOTTLES DRAWN AEROBIC ONLY Blood Culture adequate volume   Culture NO GROWTH 2 DAYS  Final   Report Status PENDING  Incomplete  Culture, expectorated sputum-assessment     Status: None   Collection Time: 09/01/17  1:04 PM  Result Value Ref Range Status   Specimen Description SPUTUM  Final   Special Requests Immunocompromised  Final   Sputum evaluation   Final    Sputum specimen not acceptable for testing.  Please recollect.   Gram Stain Report Called to,Read Back By and Verified With: RN Dola Factor [phone] MLM    Report Status 09/01/2017 FINAL  Final  Culture, Urine     Status: None   Collection Time: 09/02/17  4:46 AM  Result Value Ref Range Status   Specimen Description URINE, CATHETERIZED  Final   Special Requests NONE  Final   Culture NO GROWTH  Final   Report Status 09/03/2017 FINAL  Final      Radiology Studies: Dg Chest 2 View  Result Date: 09/03/2017 CLINICAL DATA:  Shortness of Breath EXAM: CHEST  2 VIEW COMPARISON:  09/01/2017 FINDINGS: Cardiomegaly with vascular  congestion. Small bilateral effusions with bibasilar atelectasis. No acute bony abnormality. IMPRESSION: Cardiomegaly, vascular congestion. Bibasilar atelectasis and small effusions. Electronically Signed   By: Rolm Baptise M.D.   On: 09/03/2017 08:28     Medications:  Scheduled: . apixaban  5 mg Oral BID  . aspirin EC  81 mg Oral Daily  . furosemide  80 mg Intravenous BID  . insulin aspart  0-5 Units Subcutaneous QHS  . insulin aspart  0-9 Units Subcutaneous TID WC  . potassium chloride  40 mEq Oral BID  . sodium chloride flush  3 mL Intravenous Q12H  . sodium chloride flush  3 mL Intravenous Q12H   Continuous: . sodium chloride Stopped (09/03/17 2047)  . sodium chloride    . amiodarone 30 mg/hr (09/03/17 2326)   YTK:ZSWFUX chloride, sodium chloride, acetaminophen, ondansetron (ZOFRAN) IV, sodium chloride flush, sodium chloride flush  Assessment/Plan:  Principal Problem:   Acute respiratory failure with hypoxia (HCC) Active Problems:   Coronary artery disease   Diabetes mellitus, type 2 (HCC)   Essential hypertension   Hypertension   Non-rheumatic mitral regurgitation   Dyspnea   Acute CHF (congestive heart failure) (HCC)   Pulmonary embolism (HCC)   Apneic episode   Paroxysmal A-fib (HCC)   Atrial fibrillation with RVR (HCC)   Transaminitis    Acute on chronic systolic CHF Ejection fraction found to be about 10%.  Cardiology is following closely.  Patient is on intravenous diuretics.  No ACE inhibitor due to renal insufficiency.  No beta-blocker due to acute decompensation.  Atrial fibrillation could be contributing to cardiomyopathy.  Atrial fibrillation with RVR Management per cardiology.  Patient is on intravenous amiodarone.  Patient was initially on IV heparin.  Currently on Eliquis.  History of coronary artery disease status post stenting Continue aspirin.  Cardiac catheterization done during this hospitalization did show disease in the RCA and medical  management is being pursued.  Acute respiratory failure with hypoxia/apneic  spells Most likely secondary to CHF.  Patient initially required BiPAP.  Pulmonology was consulted.  Patient has been weaned off of BiPAP.  Currently on CPAP when he sleeps.  Pulmonology has arranged outpatient follow-up for sleep study.  Acute pulmonary embolism  This was noted at [facility].  Currently on anticoagulation.  Acute kidney injury/hyperkalemia/metabolic acidosis Baseline creatinine not known.  Presented with creatinine of 1.9 which increased to 2.29.  Improving slowly.  Monitor urine output.  Potassium level was initially elevated.  Now mildly low.  Metabolic acidosis has improved.    Diabetes mellitus type 2 Continue SSI.  Monitor CBGs.  History of essential hypertension Monitor blood pressures closely.  Patient is on multiple blood pressure lowering agents.  Transaminitis Most likely due to passive congestion from CHF.  Hepatitis panel unremarkable.  LFTs are improving.  Continue to monitor closely.  Mild thrombocytopenia No evidence for overt bleeding.  Counts are stable.  DVT Prophylaxis: On Eliquis    Code Status: Full code Family Communication: Discussed with the patient and his wife Disposition Plan: Management as outlined above.  Mobilize as tolerated.    LOS: 4 days   [facility] Pager [phone] 09/04/2017, 11:51 AM  If 7PM-7AM, please contact night-coverage at [email or web address], password [facility]

## 2017-09-04 NOTE — Care Management Important Message (Signed)
Important Message  Patient Details  Name: Bernard Kim MRN: [id number] Date of Birth: 01/04/1931   Medicare Important Message Given:  Yes    Nathen May 09/04/2017, 9:50 AM

## 2017-09-04 NOTE — Progress Notes (Signed)
Advanced Heart Failure Rounding Note  PCP:  Primary Cardiologist:   Subjective:    Underwent R/L cath yesterday. Filling pressures still elevated.   No acute events overnight. Feeling mch better this AM. Oxygenating well on room air. On CPAP overnight.   Episodes of Afib overnight. Still on amio gtt.   Received IV Lasix 40 in AM and 80 in PM. Weight down 1 pound. BP remains stable. Cr 1.45.     Echo 12/20: LVEF 25-30%, diffuse hypokinesis worse anteriorly, mild AS, severe LAE, PAPP 80 mm Hg, normal RV function   Objective:   Weight Range: 171 lb 15.3 oz (78 kg) Body mass index is 26.15 kg/m.   Vital Signs:   Temp:  [98 F (36.7 C)-98.6 F (37 C)] 98.2 F (36.8 C) (12/21 0400) Pulse Rate:  [41-72] 68 (12/21 0400) Resp:  [17-26] 20 (12/21 0400) BP: (108-128)/(59-90) 127/90 (12/21 0400) SpO2:  [90 %-98 %] 90 % (12/21 0400) Weight:  [171 lb 15.3 oz (78 kg)] 171 lb 15.3 oz (78 kg) (12/21 0400) Last BM Date: 09/01/17  Weight change: Filed Weights   09/02/17 0400 09/03/17 0400 09/04/17 0400  Weight: 179 lb 14.3 oz (81.6 kg) 172 lb 6.4 oz (78.2 kg) 171 lb 15.3 oz (78 kg)    Intake/Output:   Intake/Output Summary (Last 24 hours) at 09/04/2017 0641 Last data filed at 09/04/2017 0500 Gross per 24 hour  Intake 300 ml  Output 3601 ml  Net -3301 ml      Physical Exam    General: elderly male sitting in chair. Appears well. No resp difficulty while on room air  HENT: normal  Neck: supple. JVP 9 Carotids 2+ bilat' no bruits. No lymphadenopathy or thyromegaly appreciated.  Cardiac: PMI nondisplaced. Irregularly irregular. 2/6 AS murmur.  Pulm: decreased breath sounds at the R base, bibasilar crackles  Abdomen: soft, nontender, nondistended. No hepatosplenomegaly. No bruits or masses. Good bowel sounds.  Extremities: no cyanosis, clubbing, rash or edema  + Foley Neuro: A&Ox3, no focal deficits noted, moves all 4 extremities w/o difficulty  Telemetry   NSR 60-80  with frequent PVCs. Personally reviewed.   Labs    CBC Recent Labs    09/03/17 0400 09/04/17 0352  WBC 6.4 5.8  HGB 14.1 13.9  HCT 42.8 41.1  MCV 93.0 91.9  PLT 110* 270*   Basic Metabolic Panel Recent Labs    09/02/17 0957 09/03/17 0400 09/04/17 0352  NA  --  139 139  K  --  3.1* 3.4*  CL  --  103 103  CO2  --  27 27  GLUCOSE  --  152* 154*  BUN  --  42* 29*  CREATININE  --  1.50* 1.45*  CALCIUM  --  7.9* 8.1*  MG  --  1.8  --   PHOS 3.5  --   --    Liver Function Tests Recent Labs    09/03/17 0400 09/04/17 0352  AST 239* 162*  ALT 347* 283*  ALKPHOS 102 96  BILITOT 1.9* 1.7*  PROT 5.9* 5.8*  ALBUMIN 3.4* 3.4*   No results for input(s): LIPASE, AMYLASE in the last 72 hours. Cardiac Enzymes Recent Labs    09/01/17 0813  TROPONINI 0.04*    BNP: BNP (last 3 results) Recent Labs    08/31/17 1934  BNP 586.5*    ProBNP (last 3 results) No results for input(s): PROBNP in the last 8760 hours.   D-Dimer No results for input(s): DDIMER in  the last 72 hours. Hemoglobin A1C Recent Labs    09/01/17 1508  HGBA1C 8.0*   Fasting Lipid Panel No results for input(s): CHOL, HDL, LDLCALC, TRIG, CHOLHDL, LDLDIRECT in the last 72 hours. Thyroid Function Tests No results for input(s): TSH, T4TOTAL, T3FREE, THYROIDAB in the last 72 hours.  Invalid input(s): FREET3  Other results:   Imaging    Dg Chest 2 View  Result Date: 09/03/2017 CLINICAL DATA:  Shortness of Breath EXAM: CHEST  2 VIEW COMPARISON:  09/01/2017 FINDINGS: Cardiomegaly with vascular congestion. Small bilateral effusions with bibasilar atelectasis. No acute bony abnormality. IMPRESSION: Cardiomegaly, vascular congestion. Bibasilar atelectasis and small effusions. Electronically Signed   By: Rolm Baptise M.D.   On: 09/03/2017 08:28     Medications:     Scheduled Medications: . apixaban  5 mg Oral BID  . aspirin EC  81 mg Oral Daily  . furosemide  80 mg Intravenous BID  . insulin  aspart  0-5 Units Subcutaneous QHS  . insulin aspart  0-9 Units Subcutaneous TID WC  . sodium chloride flush  3 mL Intravenous Q12H  . sodium chloride flush  3 mL Intravenous Q12H    Infusions: . sodium chloride Stopped (09/03/17 2047)  . sodium chloride    . 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  Assessment/Plan  1. Acute decompensation HFrEF:Likely secondary to onset of Afib with RVR. Few episodes of afib overnight while on amio gtt. Echo 12/20 with LVEF 25-30%. LHC yesterday showed 3 vessel disease with high grade lesion on dRCA that will be medically managed given no angina or CM. Received IV Lasix 40 in AM and 80 in PM with weight down only 1 pound.  - No beta blocker for now in the setting of acute decompensation  - Consider restarting home Losartan at lower dose, 12.5 QD as sBP120s - IV Lasix 80 BID - PT eval  2. Atrial fibrillation: now rate controlled and on NSR on amio gtt  - On amiodarone gtt  - On Eliquis 5 BID   3. PE: small, R -sided segmental PE seen on CTA chest.  - On Eliquis 5 BID    4. CAD - s/p previous RCA stent. Cath from 12/20 with mostly non-obstructive CAD but 80-90% lesion in dRCA - no angina currently. Doubt lesion is related to LV dysfunction - continue medical treatment with statin - can consider PCI once LV function recovers from tachy CM  5. Acute hypoxic respiratory failure: in the setting of acute decompensation HF. Also considering ongoing infectious process given productive cough. Pan-cx per PCCM. NGTD. Oxygenting well on room air.  - Diuresis as above  -  PCCM following, sputum, urine, and blood cx with NGTD  - Never smoker  - Will need outpatient sleep study to evaluate for OSA   6. Transaminitis with abnormal INR and thromboytopenia: Suspect secondary to shock liver from presumed hypotension while OSH vs hepatic congestion. INR 1.54 yesterday.    - Transaminases continue to trend down, continue to monitor   7. Thrombocytopenia: in the setting of liver dysfunction but also stared on heparin  gtt 3 days ago. Plt 140 --> 119 --> 110--> 104. Platelets normal on previous CBC from 2015. Switched to Eliquis 5 mg BID 12/20.  - Follow up CBC closely   8. AKI: with hyperkalemia and HAGMA.  Metabolic acidosis resolved. Renal function improving. Cr 1.45 this AM.  - Continue to monitor renal function and electrolytes in the setting of active diuresis  - Replete lytes PRN   9. T2DM: well controlled. Last A1c 8 09/01/2017.  - On SSI per primary   10. Bladder cancer: on treatment with BCG. Last treatment on 11/19 per urology note. Follows up with Dr. Rosana Hoes.  Medication concerns reviewed with patient and pharmacy team.  Length of Stay: 4  Bernard Roche, MD  09/04/2017, 6:41 AM  Advanced Heart Failure Team Pager [phone] (M-F; [place])  Please contact [facility] for night-coverage after hours (4p -7a ) and weekends on [email or web address]  Bernard Roche, MD  6:41 AM  Patient seen and examined with the above-signed Advanced Practice Provider and/or Housestaff. I personally reviewed laboratory data, imaging studies and relevant notes. I independently examined the patient and formulated the important aspects of the plan. I have edited the note to reflect any of my changes or salient points. I have personally discussed the plan with the patient and/or family.  Cath results from yesterday reviewed with him and his family. Volume status still markedly elevated. Continue IV diuresis with lasix 80 iV bid. Can add metolazone as needed. Will  supp K. Also has CAD as described above. Will treat medically.   Unfortunately back in AF today but rate improved on amio. Will continue IV amio and Eliquis. Discussed with PharmD personally. Suspect LV dysfunction related to tachy induced CM.   Bernard Bickers, MD  8:14 PM    Renal function and shock liver improving. PT has seen and recommending HHPT.

## 2017-09-05 LAB — HEPATIC FUNCTION PANEL
ALBUMIN: 3.5 g/dL (ref 3.5–5.0)
ALK PHOS: 97 U/L (ref 38–126)
ALT: 246 U/L — ABNORMAL HIGH (ref 17–63)
AST: 112 U/L — AB (ref 15–41)
BILIRUBIN TOTAL: 1.4 mg/dL — AB (ref 0.3–1.2)
Bilirubin, Direct: 0.4 mg/dL (ref 0.1–0.5)
Indirect Bilirubin: 1 mg/dL — ABNORMAL HIGH (ref 0.3–0.9)
TOTAL PROTEIN: 6.2 g/dL — AB (ref 6.5–8.1)

## 2017-09-05 LAB — GLUCOSE, CAPILLARY
Glucose-Capillary: 155 mg/dL — ABNORMAL HIGH (ref 65–99)
Glucose-Capillary: 243 mg/dL — ABNORMAL HIGH (ref 65–99)
Glucose-Capillary: 197 mg/dL — ABNORMAL HIGH (ref 65–99)
Glucose-Capillary: 293 mg/dL — ABNORMAL HIGH (ref 65–99)

## 2017-09-05 LAB — BASIC METABOLIC PANEL
ANION GAP: 9 (ref 5–15)
BUN: 25 mg/dL — AB (ref 6–20)
CHLORIDE: 99 mmol/L — AB (ref 101–111)
CO2: 30 mmol/L (ref 22–32)
Calcium: 8.4 mg/dL — ABNORMAL LOW (ref 8.9–10.3)
Creatinine, Ser: 1.4 mg/dL — ABNORMAL HIGH (ref 0.61–1.24)
GFR calc Af Amer: 52 mL/min — ABNORMAL LOW (ref >60)
GFR calc non Af Amer: 45 mL/min — ABNORMAL LOW (ref >60)
GLUCOSE: 161 mg/dL — AB (ref 65–99)
POTASSIUM: 3.7 mmol/L (ref 3.5–5.1)
Sodium: 138 mmol/L (ref 135–145)

## 2017-09-05 LAB — MAGNESIUM: MAGNESIUM: 1.8 mg/dL (ref 1.7–2.4)

## 2017-09-05 MED ORDER — LOSARTAN POTASSIUM 25 MG PO TABS
25.0000 mg | ORAL_TABLET | Freq: Every day | ORAL | Status: DC
  Administered 2017-09-05: 25 mg via ORAL
  Filled 2017-09-05: qty 1

## 2017-09-05 MED ORDER — POTASSIUM CHLORIDE CRYS ER 20 MEQ PO TBCR
40.0000 meq | EXTENDED_RELEASE_TABLET | Freq: Once | ORAL | Status: AC
  Administered 2017-09-05: 40 meq via ORAL
  Filled 2017-09-05: qty 2

## 2017-09-05 NOTE — Progress Notes (Signed)
Advanced Heart Failure Rounding Note  PCP:  Primary Cardiologist:   Subjective:    Still in AF. Rates 80-90s. Remains on amio gtt.  Continues on IV lasix. Weight down another 2 pounds. Creatinine stable. Breathing better. Still weak but feels he is getting a bit stronger. No orthopnea or PND. No CP. Walked halls with PT  Tolerating Eliquis without bleeding.   Echo 12/20: LVEF 25-30%, diffuse hypokinesis worse anteriorly, mild AS, severe LAE, PAPP 80 mm Hg, normal RV function   Objective:   Weight Range: 77 kg (169 lb 11.2 oz) Body mass index is 25.8 kg/m.   Vital Signs:   Temp:  [97.8 F (36.6 C)-98.2 F (36.8 C)] 98.2 F (36.8 C) (12/22 0348) Pulse Rate:  [37-83] 37 (12/22 0800) Resp:  [20-29] 29 (12/22 0800) BP: (107-133)/(57-84) 118/74 (12/22 0800) SpO2:  [94 %-99 %] 94 % (12/22 0800) Weight:  [77 kg (169 lb 11.2 oz)] 77 kg (169 lb 11.2 oz) (12/22 0400) Last BM Date: 09/01/17  Weight change: Filed Weights   09/03/17 0400 09/04/17 0400 09/05/17 0400  Weight: 78.2 kg (172 lb 6.4 oz) 78 kg (171 lb 15.3 oz) 77 kg (169 lb 11.2 oz)    Intake/Output:   Intake/Output Summary (Last 24 hours) at 09/05/2017 1145 Last data filed at 09/05/2017 0957 Gross per 24 hour  Intake 960 ml  Output 2550 ml  Net -1590 ml      Physical Exam    General:  Elderly male sitting in chair. No resp difficulty HEENT: normal Neck: supple. JVP to jaw. Carotids 2+ bilat; no bruits. No lymphadenopathy or thryomegaly appreciated. Cor: PMI nondisplaced. Irregular tachy. No rubs, gallops or murmurs. Lungs: clear Abdomen: soft, nontender, nondistended. No hepatosplenomegaly. No bruits or masses. Good bowel sounds. Extremities: no cyanosis, clubbing, rash, trace edema Neuro: alert & orientedx3, cranial nerves grossly intact. moves all 4 extremities w/o difficulty. Affect pleasant   Telemetry   AFIB 90-105 Personally reviewed   Labs    CBC Recent Labs    09/03/17 0400  09/04/17 0352  WBC 6.4 5.8  HGB 14.1 13.9  HCT 42.8 41.1  MCV 93.0 91.9  PLT 110* 941*   Basic Metabolic Panel Recent Labs    09/03/17 0400 09/04/17 0352 09/05/17 0232  NA 139 139 138  K 3.1* 3.4* 3.7  CL 103 103 99*  CO2 27 27 30   GLUCOSE 152* 154* 161*  BUN 42* 29* 25*  CREATININE 1.50* 1.45* 1.40*  CALCIUM 7.9* 8.1* 8.4*  MG 1.8  --  1.8   Liver Function Tests Recent Labs    09/04/17 0352 09/05/17 0232  AST 162* 112*  ALT 283* 246*  ALKPHOS 96 97  BILITOT 1.7* 1.4*  PROT 5.8* 6.2*  ALBUMIN 3.4* 3.5   No results for input(s): LIPASE, AMYLASE in the last 72 hours. Cardiac Enzymes No results for input(s): CKTOTAL, CKMB, CKMBINDEX, TROPONINI in the last 72 hours.  BNP: BNP (last 3 results) Recent Labs    08/31/17 1934  BNP 586.5*    ProBNP (last 3 results) No results for input(s): PROBNP in the last 8760 hours.   D-Dimer No results for input(s): DDIMER in the last 72 hours. Hemoglobin A1C No results for input(s): HGBA1C in the last 72 hours. Fasting Lipid Panel No results for input(s): CHOL, HDL, LDLCALC, TRIG, CHOLHDL, LDLDIRECT in the last 72 hours. Thyroid Function Tests No results for input(s): TSH, T4TOTAL, T3FREE, THYROIDAB in the last 72 hours.  Invalid input(s): FREET3  Other results:   Imaging    No results found.   Medications:     Scheduled Medications: . apixaban  5 mg Oral BID  . aspirin EC  81 mg Oral Daily  . furosemide  80 mg Intravenous BID  . insulin aspart  0-5 Units Subcutaneous QHS  . insulin aspart  0-9 Units Subcutaneous TID WC  . potassium chloride  40 mEq Oral Once  . sodium chloride flush  3 mL Intravenous Q12H  . sodium chloride flush  3 mL Intravenous Q12H  . spironolactone  12.5 mg Oral Daily    Infusions: . sodium chloride Stopped (09/03/17 2047)  . sodium chloride    . 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  Assessment/Plan  1. Acute decompensation HFrEF:Likely secondary to onset of Afib with RVR. Few episodes of afib overnight while on amio gtt. Echo 12/20 with LVEF 25-30%. LHC yesterday showed 3 vessel disease with high grade lesion on dRCA that will be medically managed given no angina or CM. - Diuresing well but still overloaded. Continue IV lasix one more day.  - No beta blocker for now in the setting of acute decompensation  - Restart home Losartan at 25 daily - Pull Foley in am   2. Atrial fibrillation:  - Remains in AF rates mildly elevated. Continue IV amio and Eliquis. - If remains in AF will do DCCV on monday    3. PE: small, R -sided segmental PE seen on CTA chest.  - On Eliquis 5 BID    4. CAD - s/p previous RCA stent. Cath from 12/20 with mostly non-obstructive CAD but 80-90% lesion in dRCA - no angina currently. Doubt lesion is related to LV dysfunction - continue medical treatment with statin - can consider PCI once LV function recovers from tachy CM  5. Acute hypoxic respiratory failure: in the setting of acute decompensation HF. Also considering ongoing infectious process given productive cough. Pan-cx per PCCM. NGTD. Oxygenting well on room air.  - Diuresis as above  - Sputum cx negative - Never smoker  - Will need outpatient sleep study to evaluate for OSA   6. Transaminitis with abnormal INR and thromboytopenia: Suspect secondary to shock liver from presumed hypotension while OSH vs hepatic congestion. INR 1.54 yesterday.    - Transaminases continue to trend down, continue to monitor   7. Thrombocytopenia: in the setting of liver dysfunction but also stared on  heparin gtt 3 days ago. Plt 140 --> 119 --> 110--> 104 - Continue to follow  8. AKI: with hyperkalemia and HAGMA.  Metabolic acidosis resolved. Renal function improving. Cr 1.45 this AM.  - Improving with resolution of shock  9. T2DM: well controlled. Last A1c 8 09/01/2017.  - On SSI per primary   10. Bladder cancer: on treatment with BCG. Last treatment on 11/19 per urology note. Follows up with Dr. Rosana Hoes.  Medication concerns reviewed with patient and pharmacy team.  Length of Stay: [place], MD  09/05/2017, 11:45 AM  Advanced Heart Failure Team Pager [phone] (M-F; 7a - 4p)  Please contact [facility] for night-coverage after hours (4p -7a ) and weekends on [email or web address]  Glori Bickers, MD  11:45 AM

## 2017-09-05 NOTE — Progress Notes (Signed)
Patient refused CPAP for tonight, will call if he changes his mind.

## 2017-09-05 NOTE — Progress Notes (Signed)
[facility] PROGRESS NOTE  Bernard Kim XBD:[id number] DOB: April 01, 1931 DOA: 08/31/2017  PCP: Marco Collie, MD  Brief History/Interval Summary: 83 year old Caucasian male with past medical history of coronary artery disease status post remote PCI, history of stroke, diabetes mellitus type 2, hypertension, bladder cancer, initially admitted to [facility] with complains of shortness of breath.  Found to have decompensated systolic CHF along with atrial fibrillation.  Transferred here due to unstable angina.  Patient also had respiratory decompensation requiring BiPAP.  Patient underwent cardiac catheterization.  Also seen by pulmonology.  Reason for Visit: Acute on chronic systolic CHF.  Atrial fibrillation.  Consultants: Cardiology.  Pulmonology.  Procedures:   Transthoracic echocardiogram Study Conclusions  - Left ventricle: The cavity size was normal. There was moderate   concentric hypertrophy. Systolic function was severely reduced.   The estimated ejection fraction was in the range of 25% to 30%.   Diffuse hypokinesis wose in the anterior myocardium. - Aortic valve: Valve mobility was restricted. There was mild   stenosis. There was moderate regurgitation. Peak velocity (S):   286 cm/s. Mean gradient (S): 16 mm Hg. - Mitral valve: Transvalvular velocity was within the normal range.   There was no evidence for stenosis. There was mild to moderate   regurgitation. - Left atrium: The atrium was severely dilated. - Right ventricle: The cavity size was normal. Wall thickness was   normal. Systolic function was normal. - Right atrium: The atrium was mildly dilated. - Tricuspid valve: There was moderate regurgitation. - Pulmonary arteries: Systolic pressure was severely increased. PA   peak pressure: 80 mm Hg (S). - Pericardium, extracardiac: A trivial pericardial effusion was   identified.  Impressions:  - According to the aortic valve gradient, aortic stenosis  is mild.   However, given his reduced systolic function and restricted   aortic valve mobiliy, cannot rule out low-flow, low-gradient   aortic stenosis. Consider dobutamine stress echo if clinically   indicated.  Cardiac catheterization Conclusion     Prox RCA lesion is 40% stenosed.  Mid RCA lesion is 30% stenosed.  Dist RCA lesion is 60% stenosed.  Post Atrio lesion is 85% stenosed.  Mid LM to Dist LM lesion is 40% stenosed.  Prox LAD lesion is 60% stenosed.  Ost 1st Diag to 1st Diag lesion is 30% stenosed.  Prox Cx to Mid Cx lesion is 20% stenosed.   Findings:  Ao = 121/58 (82)  LV =  146/29 RA = 12 RV = 56/14 PA = 57/21 (33) PCW = 31 (v=45) Fick cardiac output/index = 4.2/2.2 PVR = < 1.0 WU SVR = 1325 Ao sat = 99% PA sat = 66%, 67%  AoV peak-to-peak gradient = 70mmHG  Assessment: 1. 3v CAD with high-grade lesion in distal RCA otherwise non-obstructive disease 2. 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 Elevated filling pressures with prominent v-waves in PCWP tracing suggestive of mitral regurgitation     Antibiotics: None  Subjective/Interval History: Patient continues to feel better.  Breathing is improving.  Denies any chest pain.  No nausea vomiting.  Patient's son is at the bedside.    ROS: Denies any headaches.  Objective:  Vital Signs  Vitals:   09/05/17 0046 09/05/17 0348 09/05/17 0400 09/05/17 0759  BP: (!) 107/57 122/67  118/74  Pulse:    (!) 47  Resp:    (!) 21  Temp: 97.8 F (36.6 C) 98.2 F (36.8 C)    TempSrc: Axillary Axillary  SpO2:    94%  Weight:   77 kg (169 lb 11.2 oz)   Height:        Intake/Output Summary (Last 24 hours) at 09/05/2017 0825 Last data filed at 09/05/2017 0600 Gross per 24 hour  Intake 720 ml  Output 3250 ml  Net -2530 ml   Filed Weights   09/03/17 0400 09/04/17 0400 09/05/17 0400  Weight: 78.2 kg (172 lb 6.4 oz) 78 kg (171 lb 15.3 oz) 77 kg (169 lb 11.2 oz)     General appearance: Awake alert.  In no distress Resp: Improving air entry bilaterally.  Continues to have a few crackles at the bases.  No wheezing.  No rhonchi.  Mildly tachypneic.   Cardio: S1-S2 is irregularly irregular.  No S3-S4.  Systolic murmur appreciated over the precordium.  Lower extremity edema is present and improving. GI: Abdomen remains soft.  Nontender nondistended.  Bowel sounds are present.  No masses organomegaly Extremities: Improving edema Neurologic: No obvious focal neurological deficits.  Lab Results:  Data Reviewed: I have personally reviewed following labs and imaging studies  CBC: Recent Labs  Lab 08/31/17 1934 09/02/17 0035 09/03/17 0400 09/04/17 0352  WBC 11.7* 9.0 6.4 5.8  NEUTROABS 9.4*  --   --   --   HGB 16.6 14.2 14.1 13.9  HCT 49.4 41.9 42.8 41.1  MCV 94.1 91.9 93.0 91.9  PLT 140* 119* 110* 104*    Basic Metabolic Panel: Recent Labs  Lab 09/01/17 0251 09/02/17 0035 09/02/17 0957 09/03/17 0400 09/04/17 0352 09/05/17 0232  NA 138 138  --  139 139 138  K 4.9 3.4*  --  3.1* 3.4* 3.7  CL 102 104  --  103 103 99*  CO2 15* 24  --  27 27 30   GLUCOSE 233* 188*  --  152* 154* 161*  BUN 48* 60*  --  42* 29* 25*  CREATININE 2.29* 2.09*  --  1.50* 1.45* 1.40*  CALCIUM 8.3* 7.8*  --  7.9* 8.1* 8.4*  MG  --   --   --  1.8  --  1.8  PHOS  --   --  3.5  --   --   --     GFR: Estimated Creatinine Clearance: 38 mL/min (A) (by C-G formula based on SCr of 1.4 mg/dL (H)).  Liver Function Tests: Recent Labs  Lab 08/31/17 1934 09/01/17 1112 09/02/17 0035 09/03/17 0400 09/04/17 0352  AST 163* 742* 536* 239* 162*  ALT 122* 438* 475* 347* 283*  ALKPHOS 109 108 104 102 96  BILITOT 2.7* 1.8* 2.0* 1.9* 1.7*  PROT 6.1* 6.0* 5.7* 5.9* 5.8*  ALBUMIN 3.6 3.5 3.4* 3.4* 3.4*     Coagulation Profile: Recent Labs  Lab 08/31/17 1934 09/01/17 0251 09/03/17 1103  INR 1.82 1.97 1.54    Cardiac Enzymes: Recent Labs  Lab 08/31/17 1934  09/01/17 0251 09/01/17 0813  TROPONINI 0.05* 0.05* 0.04*    HbA1C: No results for input(s): HGBA1C in the last 72 hours.  CBG: Recent Labs  Lab 09/04/17 0602 09/04/17 1138 09/04/17 1604 09/04/17 2124 09/05/17 0556  GLUCAP 145* 184* 187* 193* 155*    Recent Results (from the past 240 hour(s))  Culture, blood (Routine X 2) w Reflex to ID Panel     Status: None (Preliminary result)   Collection Time: 09/01/17 11:27 AM  Result Value Ref Range Status   Specimen Description BLOOD LEFT ANTECUBITAL  Final   Special Requests   Final  BOTTLES DRAWN AEROBIC ONLY Blood Culture adequate volume   Culture NO GROWTH 3 DAYS  Final   Report Status PENDING  Incomplete  Culture, blood (Routine X 2) w Reflex to ID Panel     Status: None (Preliminary result)   Collection Time: 09/01/17 11:31 AM  Result Value Ref Range Status   Specimen Description BLOOD LEFT ARM  Final   Special Requests   Final    BOTTLES DRAWN AEROBIC ONLY Blood Culture adequate volume   Culture NO GROWTH 3 DAYS  Final   Report Status PENDING  Incomplete  Culture, expectorated sputum-assessment     Status: None   Collection Time: 09/01/17  1:04 PM  Result Value Ref Range Status   Specimen Description SPUTUM  Final   Special Requests Immunocompromised  Final   Sputum evaluation   Final    Sputum specimen not acceptable for testing.  Please recollect.   Gram Stain Report Called to,Read Back By and Verified With: RN Dola Factor [phone] MLM    Report Status 09/01/2017 FINAL  Final  Culture, Urine     Status: None   Collection Time: 09/02/17  4:46 AM  Result Value Ref Range Status   Specimen Description URINE, CATHETERIZED  Final   Special Requests NONE  Final   Culture NO GROWTH  Final   Report Status 09/03/2017 FINAL  Final      Radiology Studies: No results found.   Medications:  Scheduled: . apixaban  5 mg Oral BID  . aspirin EC  81 mg Oral Daily  . furosemide  80 mg Intravenous BID  . insulin aspart   0-5 Units Subcutaneous QHS  . insulin aspart  0-9 Units Subcutaneous TID WC  . sodium chloride flush  3 mL Intravenous Q12H  . sodium chloride flush  3 mL Intravenous Q12H  . spironolactone  12.5 mg Oral Daily   Continuous: . sodium chloride Stopped (09/03/17 2047)  . sodium chloride    . amiodarone 30 mg/hr (09/05/17 0552)   FTD:DUKGUR chloride, sodium chloride, acetaminophen, ondansetron (ZOFRAN) IV, sodium chloride flush, sodium chloride flush  Assessment/Plan:  Principal Problem:   Acute respiratory failure with hypoxia (HCC) Active Problems:   Coronary artery disease   Diabetes mellitus, type 2 (HCC)   Essential hypertension   Hypertension   Non-rheumatic mitral regurgitation   Dyspnea   Acute CHF (congestive heart failure) (HCC)   Pulmonary embolism (HCC)   Apneic episode   Paroxysmal A-fib (HCC)   Atrial fibrillation with RVR (HCC)   Transaminitis    Acute on chronic systolic CHF Ejection fraction found to be about 10%.  Cardiology was consulted.  Patient was placed on diuretics.  He is diuresing well.  His weight is decreasing.  No ACE inhibitor due to renal insufficiency.  No beta-blocker due to acute decompensation.  Atrial fibrillation could be contributing to cardiomyopathy.  Further management per cardiology.  Atrial fibrillation with RVR Management per cardiology.  Patient is on intravenous amiodarone.  Patient was initially on IV heparin.  Currently on Eliquis.  History of coronary artery disease status post stenting Continue aspirin.  Cardiac catheterization done during this hospitalization did show disease in the RCA and medical management is being pursued.  Acute respiratory failure with hypoxia/apneic spells Most likely secondary to CHF.  Patient initially required BiPAP.  Pulmonology was consulted.  Patient has been weaned off of BiPAP.  Currently on CPAP when he sleeps.  Pulmonology has arranged outpatient follow-up for sleep study.  Acute  pulmonary  embolism  This was noted while he was at [facility].  Currently on anticoagulation.  Acute kidney injury/hyperkalemia/metabolic acidosis Baseline creatinine not known.  Presented with creatinine of 1.9 which increased to 2.29.  Creatinine has been slowly improving.  Creatinine down to 1.40 today.  He has good urine output.  Potassium level was initially elevated.  Normal today.  He did get supplements yesterday.  Metabolic acidosis has improved.    Diabetes mellitus type 2 Continue SSI.  Monitor CBGs.  History of essential hypertension Blood pressure is reasonably well controlled.  Continue to monitor closely.  Patient is on multiple blood pressure lowering agents.  Transaminitis Most likely due to passive congestion from CHF.  Hepatitis panel unremarkable.  LFTs are improving.  Continue to monitor closely.  Mild thrombocytopenia No evidence for overt bleeding.  Counts are stable.  DVT Prophylaxis: On Eliquis    Code Status: Full code Family Communication: Discussed with the patient and his son Disposition Plan: Mobilize as tolerated.  Home health recommended by physical therapy.  Further management per cardiology.    LOS: 5 days   [facility] Pager [phone] 09/05/2017, 8:25 AM  If 7PM-7AM, please contact night-coverage at [email or web address], password [facility]

## 2017-09-06 LAB — CULTURE, BLOOD (ROUTINE X 2)
CULTURE: NO GROWTH
Culture: NO GROWTH
SPECIAL REQUESTS: ADEQUATE
Special Requests: ADEQUATE

## 2017-09-06 LAB — GLUCOSE, CAPILLARY
Glucose-Capillary: 143 mg/dL — ABNORMAL HIGH (ref 65–99)
Glucose-Capillary: 174 mg/dL — ABNORMAL HIGH (ref 65–99)
Glucose-Capillary: 144 mg/dL — ABNORMAL HIGH (ref 65–99)
Glucose-Capillary: 179 mg/dL — ABNORMAL HIGH (ref 65–99)

## 2017-09-06 LAB — COMPREHENSIVE METABOLIC PANEL
ALK PHOS: 91 U/L (ref 38–126)
ALT: 180 U/L — AB (ref 17–63)
AST: 68 U/L — AB (ref 15–41)
Albumin: 3.4 g/dL — ABNORMAL LOW (ref 3.5–5.0)
Anion gap: 11 (ref 5–15)
BUN: 23 mg/dL — AB (ref 6–20)
CALCIUM: 8.3 mg/dL — AB (ref 8.9–10.3)
CHLORIDE: 95 mmol/L — AB (ref 101–111)
CO2: 29 mmol/L (ref 22–32)
CREATININE: 1.41 mg/dL — AB (ref 0.61–1.24)
GFR calc non Af Amer: 44 mL/min — ABNORMAL LOW (ref >60)
GFR, EST AFRICAN AMERICAN: 51 mL/min — AB (ref >60)
Glucose, Bld: 122 mg/dL — ABNORMAL HIGH (ref 65–99)
Potassium: 3.8 mmol/L (ref 3.5–5.1)
SODIUM: 135 mmol/L (ref 135–145)
Total Bilirubin: 1.6 mg/dL — ABNORMAL HIGH (ref 0.3–1.2)
Total Protein: 6.1 g/dL — ABNORMAL LOW (ref 6.5–8.1)

## 2017-09-06 MED ORDER — LOSARTAN POTASSIUM 25 MG PO TABS: 25.0000 mg | ORAL_TABLET | Freq: Every day | ORAL | Status: DC

## 2017-09-06 MED ORDER — SPIRONOLACTONE 12.5 MG HALF TABLET
12.5000 mg | ORAL_TABLET | Freq: Every day | ORAL | Status: DC
  Filled 2017-09-06: qty 1

## 2017-09-06 MED ORDER — SODIUM CHLORIDE 0.9 % IV BOLUS (SEPSIS)
250.0000 mL | Freq: Once | INTRAVENOUS | Status: AC
  Administered 2017-09-06: 250 mL via INTRAVENOUS

## 2017-09-06 NOTE — Progress Notes (Signed)
Advanced Heart Failure Rounding Note  PCP:  Primary Cardiologist:   Subjective:    Remains on amio gtt and IV lasix. Converted to NSR. HR in 60-70s  Feels good but SBP in 70-80 range this am. Denies dizziness. No SOB. A bit weak.   Tolerating Eliquis without bleeding.   Echo 12/20: LVEF 25-30%, diffuse hypokinesis worse anteriorly, mild AS, severe LAE, PAPP 80 mm Hg, normal RV function   Objective:   Weight Range: 76 kg (167 lb 8 oz) Body mass index is 25.47 kg/m.   Vital Signs:   Temp:  [97.8 F (36.6 C)-99.1 F (37.3 C)] 98 F (36.7 C) (12/23 0426) Pulse Rate:  [47-106] 73 (12/23 0800) Resp:  [17-31] 17 (12/23 0800) BP: (75-110)/(48-95) 84/59 (12/23 0800) SpO2:  [92 %-99 %] 93 % (12/23 0800) Weight:  [76 kg (167 lb 8 oz)] 76 kg (167 lb 8 oz) (12/23 0626) Last BM Date: 09/05/17  Weight change: Filed Weights   09/04/17 0400 09/05/17 0400 09/06/17 0626  Weight: 78 kg (171 lb 15.3 oz) 77 kg (169 lb 11.2 oz) 76 kg (167 lb 8 oz)    Intake/Output:   Intake/Output Summary (Last 24 hours) at 09/06/2017 1037 Last data filed at 09/06/2017 0428 Gross per 24 hour  Intake 240 ml  Output 1375 ml  Net -1135 ml      Physical Exam    General:  Elderly male sitting in bed. NAD.  HEENT: normal Neck: supple. no JVP flat. Carotids 2+ bilat; no bruits. No lymphadenopathy or thryomegaly appreciated. Cor: PMI nondisplaced. Mildly irregular rate & rhythm. 2/6 AS Lungs: clear Abdomen: soft, nontender, nondistended. No hepatosplenomegaly. No bruits or masses. Good bowel sounds. Extremities: no cyanosis, clubbing, rash, edema  + foley Neuro: alert & orientedx3, cranial nerves grossly intact. moves all 4 extremities w/o difficulty. Affect pleasant   Telemetry   NSR with PACs 60-70s. Personally reviewed   Labs    CBC Recent Labs    09/04/17 0352  WBC 5.8  HGB 13.9  HCT 41.1  MCV 91.9  PLT 706*   Basic Metabolic Panel Recent Labs    09/05/17 0232  09/06/17 0202  NA 138 135  K 3.7 3.8  CL 99* 95*  CO2 30 29  GLUCOSE 161* 122*  BUN 25* 23*  CREATININE 1.40* 1.41*  CALCIUM 8.4* 8.3*  MG 1.8  --    Liver Function Tests Recent Labs    09/05/17 0232 09/06/17 0202  AST 112* 68*  ALT 246* 180*  ALKPHOS 97 91  BILITOT 1.4* 1.6*  PROT 6.2* 6.1*  ALBUMIN 3.5 3.4*   No results for input(s): LIPASE, AMYLASE in the last 72 hours. Cardiac Enzymes No results for input(s): CKTOTAL, CKMB, CKMBINDEX, TROPONINI in the last 72 hours.  BNP: BNP (last 3 results) Recent Labs    08/31/17 1934  BNP 586.5*    ProBNP (last 3 results) No results for input(s): PROBNP in the last 8760 hours.   D-Dimer No results for input(s): DDIMER in the last 72 hours. Hemoglobin A1C No results for input(s): HGBA1C in the last 72 hours. Fasting Lipid Panel No results for input(s): CHOL, HDL, LDLCALC, TRIG, CHOLHDL, LDLDIRECT in the last 72 hours. Thyroid Function Tests No results for input(s): TSH, T4TOTAL, T3FREE, THYROIDAB in the last 72 hours.  Invalid input(s): FREET3  Other results:   Imaging    No results found.   Medications:     Scheduled Medications: . apixaban  5 mg Oral BID  .  aspirin EC  81 mg Oral Daily  . furosemide  80 mg Intravenous BID  . insulin aspart  0-5 Units Subcutaneous QHS  . insulin aspart  0-9 Units Subcutaneous TID WC  . [START ON 09/07/2017] losartan  25 mg Oral Daily  . sodium chloride flush  3 mL Intravenous Q12H  . sodium chloride flush  3 mL Intravenous Q12H  . [START ON 09/07/2017] spironolactone  12.5 mg Oral Daily    Infusions: . sodium chloride Stopped (09/03/17 2047)  . sodium chloride    . amiodarone 30 mg/hr (09/06/17 0625)    PRN Medications: sodium chloride, sodium chloride, acetaminophen, ondansetron (ZOFRAN) IV, sodium chloride flush, sodium chloride flush    Patient Profile   Bernard Kim is a 83 yo M with history of CAD, inferior MI s/p remote PCI to RCA in 1995, HTN, CVA  in 2017 with no residual deficits, and bladder cancer on BCG treatment who was admitted for acute decompensation of heart failure.  Assessment/Plan  1. Acute decompensation HFrEF:Likely secondary to onset of Afib with RVR. Few episodes of afib overnight while on amio gtt. Echo 12/20 with LVEF 25-30%. LHC yesterday showed 3 vessel disease with high grade lesion on dRCA that will be medically managed given no angina or CM. - Weight down another 2 pounds. SBP now soft. He appears dry. Stop IV lasix. Will give back 250cc of fluid with low BP, - No beta blocker for now in the setting of acute decompensation  - Hold losartan for now - Pull Foley today. Discussed possibility of urinary retention with patient, family and RN. Will watch closely.    2. Atrial fibrillation:  - Now back in NSR. Continue IV amio on more day. Continue  Eliquis.   3. PE: small, R -sided segmental PE seen on CTA chest.  - On Eliquis 5 BID    4. CAD - s/p previous RCA stent. Cath from 12/20 with mostly non-obstructive CAD but 80-90% lesion in dRCA - no s/s angina Doubt lesion is related to LV dysfunction - continue medical treatment with statin - can consider PCI once LV function recovers from tachy CM  5. Acute hypoxic respiratory failure: in the setting of acute decompensation HF. Also considering ongoing infectious process given productive cough. Pan-cx per PCCM. NGTD. Oxygenting well on room air.  - Much improved with diuresis.  - Sputum cx negative - Never smoker  - Will need outpatient sleep study to evaluate for OSA   6. Transaminitis with abnormal INR and thromboytopenia: Suspect secondary to shock liver from presumed hypotension while OSH vs hepatic congestion. INR 1.54 yesterday.    - Transaminases continue to trend down, continue to monitor   7. Thrombocytopenia: in the setting of liver dysfunction but also stared on heparin gtt 3 days ago. Plt 140 --> 119 --> 110--> 104 - Continue to follow. Recheck  CBC in am.   8. AKI: with hyperkalemia and HAGMA.  Metabolic acidosis resolved. Renal function improving. Cr 1.41 this AM.  - Improving with resolution of shock  9. T2DM: well controlled. Last A1c 8 09/01/2017.  - On SSI per primary   10. Bladder cancer: on treatment with BCG. Last treatment on 11/19 per urology note. Follows up with Dr. Rosana Hoes.  Medication concerns reviewed with patient and pharmacy team.  Length of Stay: [place], MD  09/06/2017, 10:37 AM  Advanced Heart Failure Team Pager [phone] (M-F; 7a - 4p)  Please contact [facility] for night-coverage after hours (  4p -7a ) and weekends on [email or web address]

## 2017-09-06 NOTE — Progress Notes (Signed)
[facility] PROGRESS NOTE  Bernard Kim RWE:[id number] DOB: 1931-06-30 DOA: 08/31/2017  PCP: Marco Collie, MD  Brief History/Interval Summary: 83 year old Caucasian male with past medical history of coronary artery disease status post remote PCI, history of stroke, diabetes mellitus type 2, hypertension, bladder cancer, initially admitted to [facility] with complains of shortness of breath.  Found to have decompensated systolic CHF along with atrial fibrillation.  Transferred here due to unstable angina.  Patient also had respiratory decompensation requiring BiPAP.  Patient underwent cardiac catheterization.  Also seen by pulmonology.  Reason for Visit: Acute on chronic systolic CHF.  Atrial fibrillation.  Consultants: Cardiology.  Pulmonology.  Procedures:   Transthoracic echocardiogram Study Conclusions  - Left ventricle: The cavity size was normal. There was moderate   concentric hypertrophy. Systolic function was severely reduced.   The estimated ejection fraction was in the range of 25% to 30%.   Diffuse hypokinesis wose in the anterior myocardium. - Aortic valve: Valve mobility was restricted. There was mild   stenosis. There was moderate regurgitation. Peak velocity (S):   286 cm/s. Mean gradient (S): 16 mm Hg. - Mitral valve: Transvalvular velocity was within the normal range.   There was no evidence for stenosis. There was mild to moderate   regurgitation. - Left atrium: The atrium was severely dilated. - Right ventricle: The cavity size was normal. Wall thickness was   normal. Systolic function was normal. - Right atrium: The atrium was mildly dilated. - Tricuspid valve: There was moderate regurgitation. - Pulmonary arteries: Systolic pressure was severely increased. PA   peak pressure: 80 mm Hg (S). - Pericardium, extracardiac: A trivial pericardial effusion was   identified.  Impressions:  - According to the aortic valve gradient, aortic stenosis  is mild.   However, given his reduced systolic function and restricted   aortic valve mobiliy, cannot rule out low-flow, low-gradient   aortic stenosis. Consider dobutamine stress echo if clinically   indicated.  Cardiac catheterization Conclusion     Prox RCA lesion is 40% stenosed.  Mid RCA lesion is 30% stenosed.  Dist RCA lesion is 60% stenosed.  Post Atrio lesion is 85% stenosed.  Mid LM to Dist LM lesion is 40% stenosed.  Prox LAD lesion is 60% stenosed.  Ost 1st Diag to 1st Diag lesion is 30% stenosed.  Prox Cx to Mid Cx lesion is 20% stenosed.   Findings:  Ao = 121/58 (82)  LV =  146/29 RA = 12 RV = 56/14 PA = 57/21 (33) PCW = 31 (v=45) Fick cardiac output/index = 4.2/2.2 PVR = < 1.0 WU SVR = 1325 Ao sat = 99% PA sat = 66%, 67%  AoV peak-to-peak gradient = 40mmHG  Assessment: 1. 3v CAD with high-grade lesion in distal RCA otherwise non-obstructive disease 2. 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 Elevated filling pressures with prominent v-waves in PCWP tracing suggestive of mitral regurgitation     Antibiotics: None  Subjective/Interval History: Patient denies any complaints this morning.  His blood pressure was noted to be low.  He denied any dizziness/lightheadedness.  No chest pain.  He states that his shortness of breath has improved.  Patient's daughter is at the bedside.    ROS: Denies any headaches.  Objective:  Vital Signs  Vitals:   09/06/17 0006 09/06/17 0426 09/06/17 0626 09/06/17 0759  BP: (!) 90/53 95/60  (!) 84/59  Pulse: (!) 53 76  (!) 47  Resp: (!) 28 (!) 25  (!)  31  Temp:  98 F (36.7 C)    TempSrc:  Oral  Oral  SpO2: 96% 92%  95%  Weight:   76 kg (167 lb 8 oz)   Height:        Intake/Output Summary (Last 24 hours) at 09/06/2017 0839 Last data filed at 09/06/2017 0428 Gross per 24 hour  Intake 480 ml  Output 1375 ml  Net -895 ml   Filed Weights   09/04/17 0400 09/05/17 0400  09/06/17 0626  Weight: 78 kg (171 lb 15.3 oz) 77 kg (169 lb 11.2 oz) 76 kg (167 lb 8 oz)    General appearance: Awake alert.  In no distress. Resp: Good air entry bilaterally.  Few crackles at the bases but much improved from before.  No wheezing.  No rhonchi.  Normal effort at rest.  Cardio: S1-S2 piece to be regular today.  No premature beats heard.  Systolic murmur appreciated over the precordium.   GI: Abdomen is soft.  Nontender nondistended.  Bowel sounds are present.  No masses organomegaly Extremities: Improving edema in the lower extremities Neurologic: No obvious focal neurological deficits.  Lab Results:  Data Reviewed: I have personally reviewed following labs and imaging studies  CBC: Recent Labs  Lab 08/31/17 1934 09/02/17 0035 09/03/17 0400 09/04/17 0352  WBC 11.7* 9.0 6.4 5.8  NEUTROABS 9.4*  --   --   --   HGB 16.6 14.2 14.1 13.9  HCT 49.4 41.9 42.8 41.1  MCV 94.1 91.9 93.0 91.9  PLT 140* 119* 110* 104*    Basic Metabolic Panel: Recent Labs  Lab 09/02/17 0035 09/02/17 0957 09/03/17 0400 09/04/17 0352 09/05/17 0232 09/06/17 0202  NA 138  --  139 139 138 135  K 3.4*  --  3.1* 3.4* 3.7 3.8  CL 104  --  103 103 99* 95*  CO2 24  --  27 27 30 29   GLUCOSE 188*  --  152* 154* 161* 122*  BUN 60*  --  42* 29* 25* 23*  CREATININE 2.09*  --  1.50* 1.45* 1.40* 1.41*  CALCIUM 7.8*  --  7.9* 8.1* 8.4* 8.3*  MG  --   --  1.8  --  1.8  --   PHOS  --  3.5  --   --   --   --     GFR: Estimated Creatinine Clearance: 37.7 mL/min (A) (by C-G formula based on SCr of 1.41 mg/dL (H)).  Liver Function Tests: Recent Labs  Lab 09/02/17 0035 09/03/17 0400 09/04/17 0352 09/05/17 0232 09/06/17 0202  AST 536* 239* 162* 112* 68*  ALT 475* 347* 283* 246* 180*  ALKPHOS 104 102 96 97 91  BILITOT 2.0* 1.9* 1.7* 1.4* 1.6*  PROT 5.7* 5.9* 5.8* 6.2* 6.1*  ALBUMIN 3.4* 3.4* 3.4* 3.5 3.4*     Coagulation Profile: Recent Labs  Lab 08/31/17 1934 09/01/17 0251  09/03/17 1103  INR 1.82 1.97 1.54    Cardiac Enzymes: Recent Labs  Lab 08/31/17 1934 09/01/17 0251 09/01/17 0813  TROPONINI 0.05* 0.05* 0.04*    CBG: Recent Labs  Lab 09/05/17 0556 09/05/17 1122 09/05/17 1618 09/05/17 2111 09/06/17 0644  GLUCAP 155* 243* 197* 293* 143*    Recent Results (from the past 240 hour(s))  Culture, blood (Routine X 2) w Reflex to ID Panel     Status: None (Preliminary result)   Collection Time: 09/01/17 11:27 AM  Result Value Ref Range Status   Specimen Description BLOOD LEFT ANTECUBITAL  Final  Special Requests   Final    BOTTLES DRAWN AEROBIC ONLY Blood Culture adequate volume   Culture NO GROWTH 4 DAYS  Final   Report Status PENDING  Incomplete  Culture, blood (Routine X 2) w Reflex to ID Panel     Status: None (Preliminary result)   Collection Time: 09/01/17 11:31 AM  Result Value Ref Range Status   Specimen Description BLOOD LEFT ARM  Final   Special Requests   Final    BOTTLES DRAWN AEROBIC ONLY Blood Culture adequate volume   Culture NO GROWTH 4 DAYS  Final   Report Status PENDING  Incomplete  Culture, expectorated sputum-assessment     Status: None   Collection Time: 09/01/17  1:04 PM  Result Value Ref Range Status   Specimen Description SPUTUM  Final   Special Requests Immunocompromised  Final   Sputum evaluation   Final    Sputum specimen not acceptable for testing.  Please recollect.   Gram Stain Report Called to,Read Back By and Verified With: RN Dola Factor [phone] MLM    Report Status 09/01/2017 FINAL  Final  Culture, Urine     Status: None   Collection Time: 09/02/17  4:46 AM  Result Value Ref Range Status   Specimen Description URINE, CATHETERIZED  Final   Special Requests NONE  Final   Culture NO GROWTH  Final   Report Status 09/03/2017 FINAL  Final      Radiology Studies: No results found.   Medications:  Scheduled: . apixaban  5 mg Oral BID  . aspirin EC  81 mg Oral Daily  . furosemide  80 mg  Intravenous BID  . insulin aspart  0-5 Units Subcutaneous QHS  . insulin aspart  0-9 Units Subcutaneous TID WC  . [START ON 09/07/2017] losartan  25 mg Oral Daily  . sodium chloride flush  3 mL Intravenous Q12H  . sodium chloride flush  3 mL Intravenous Q12H  . [START ON 09/07/2017] spironolactone  12.5 mg Oral Daily   Continuous: . sodium chloride Stopped (09/03/17 2047)  . sodium chloride    . amiodarone 30 mg/hr (09/06/17 0625)   EQA:STMHDQ chloride, sodium chloride, acetaminophen, ondansetron (ZOFRAN) IV, sodium chloride flush, sodium chloride flush  Assessment/Plan:  Principal Problem:   Acute respiratory failure with hypoxia (HCC) Active Problems:   Coronary artery disease   Diabetes mellitus, type 2 (HCC)   Essential hypertension   Hypertension   Non-rheumatic mitral regurgitation   Dyspnea   Acute CHF (congestive heart failure) (HCC)   Pulmonary embolism (HCC)   Apneic episode   Paroxysmal A-fib (HCC)   Atrial fibrillation with RVR (HCC)   Transaminitis    Acute on chronic systolic CHF Ejection fraction found to be about 10%.  Cardiology was consulted.  Patient was placed on diuretics.  She has been diuresing well.  His weight has been decreasing.  Noted to be hypotensive this morning.  Unfortunately he was given his dose of Lasix earlier today.  Will hold off on further doses for now.  May actually need to be given IV fluids.  Will see what cardiology states.  Noted to be on ARB which will be held today due to low blood pressure.  No beta-blocker due to acute decompensation.  Atrial fibrillation could be contributing to cardiomyopathy.  Further management per cardiology.  Atrial fibrillation with RVR Patient has been on intravenous amiodarone.  He was initially on IV heparin and then transitioned to Eliquis.  Appears to have converted  to sinus rhythm this morning.  Management per cardiology.    History of coronary artery disease status post stenting Continue  aspirin.  Cardiac catheterization done during this hospitalization did show disease in the RCA and medical management is being pursued.  Definitive intervention to be considered in the future.  Acute respiratory failure with hypoxia/apneic spells Most likely secondary to CHF.  Patient initially required BiPAP.  Pulmonology was consulted.  Patient has been weaned off of BiPAP.  Currently on CPAP when he sleeps.  Pulmonology has arranged outpatient follow-up for sleep study.  Acute pulmonary embolism  This was noted while he was at [facility].  Currently on anticoagulation.  Acute kidney injury/hyperkalemia/metabolic acidosis Baseline creatinine not known.  Presented with creatinine of 1.9 which increased to 2.29.  Creatinine has been slowly improving.  Potassium level is normal.  He did get supplements yesterday.  Metabolic acidosis has improved.    Diabetes mellitus type 2 Continue SSI.  Monitor CBGs.  HbA1c 8.0.  History of essential hypertension now with hypotension Blood pressure low this morning.  Hold his ARB.  IV fluids ordered by cardiology.  Transaminitis Most likely due to passive congestion from CHF.  Hepatitis panel unremarkable.  LFTs are improving.  Continue to monitor closely.  Mild thrombocytopenia No evidence for overt bleeding.  Counts are stable.  DVT Prophylaxis: On Eliquis    Code Status: Full code Family Communication: Discussed with the patient and his daughter Disposition Plan: Foley catheter to be discontinued today.  Home health recommended by physical therapy.     LOS: 6 days   [facility] Pager [phone] 09/06/2017, 8:39 AM  If 7PM-7AM, please contact night-coverage at [email or web address], password [facility]

## 2017-09-07 ENCOUNTER — Other Ambulatory Visit: Payer: Self-pay

## 2017-09-07 LAB — GLUCOSE, CAPILLARY
Glucose-Capillary: 150 mg/dL — ABNORMAL HIGH (ref 65–99)
Glucose-Capillary: 214 mg/dL — ABNORMAL HIGH (ref 65–99)

## 2017-09-07 LAB — COMPREHENSIVE METABOLIC PANEL
ALT: 124 U/L — ABNORMAL HIGH (ref 17–63)
ANION GAP: 9 (ref 5–15)
AST: 38 U/L (ref 15–41)
Albumin: 3.1 g/dL — ABNORMAL LOW (ref 3.5–5.0)
Alkaline Phosphatase: 82 U/L (ref 38–126)
BUN: 25 mg/dL — ABNORMAL HIGH (ref 6–20)
CHLORIDE: 94 mmol/L — AB (ref 101–111)
CO2: 31 mmol/L (ref 22–32)
Calcium: 8.3 mg/dL — ABNORMAL LOW (ref 8.9–10.3)
Creatinine, Ser: 1.55 mg/dL — ABNORMAL HIGH (ref 0.61–1.24)
GFR, EST AFRICAN AMERICAN: 46 mL/min — AB (ref >60)
GFR, EST NON AFRICAN AMERICAN: 39 mL/min — AB (ref >60)
Glucose, Bld: 143 mg/dL — ABNORMAL HIGH (ref 65–99)
Potassium: 4.1 mmol/L (ref 3.5–5.1)
SODIUM: 134 mmol/L — AB (ref 135–145)
Total Bilirubin: 1.7 mg/dL — ABNORMAL HIGH (ref 0.3–1.2)
Total Protein: 5.8 g/dL — ABNORMAL LOW (ref 6.5–8.1)

## 2017-09-07 LAB — CBC
HCT: 45.1 % (ref 39.0–52.0)
HEMOGLOBIN: 14.9 g/dL (ref 13.0–17.0)
MCH: 30.5 pg (ref 26.0–34.0)
MCHC: 33 g/dL (ref 30.0–36.0)
MCV: 92.4 fL (ref 78.0–100.0)
Platelets: 129 10*3/uL — ABNORMAL LOW (ref 150–400)
RBC: 4.88 MIL/uL (ref 4.22–5.81)
RDW: 13.2 % (ref 11.5–15.5)
WBC: 9.8 10*3/uL (ref 4.0–10.5)

## 2017-09-07 MED ORDER — FUROSEMIDE 40 MG PO TABS: 40.0000 mg | ORAL_TABLET | Freq: Every day | ORAL | 1 refills | Status: DC

## 2017-09-07 MED ORDER — APIXABAN 5 MG PO TABS: 5.0000 mg | ORAL_TABLET | Freq: Two times a day (BID) | ORAL | 1 refills | Status: DC

## 2017-09-07 MED ORDER — SPIRONOLACTONE 25 MG PO TABS: 12.5000 mg | ORAL_TABLET | Freq: Every day | ORAL | 1 refills | Status: DC

## 2017-09-07 MED ORDER — LOSARTAN POTASSIUM 25 MG PO TABS: 12.5000 mg | ORAL_TABLET | Freq: Every day | ORAL | Status: DC

## 2017-09-07 MED ORDER — AMIODARONE HCL 200 MG PO TABS: 200.0000 mg | ORAL_TABLET | Freq: Two times a day (BID) | ORAL | 0 refills | Status: DC

## 2017-09-07 MED ORDER — FUROSEMIDE 40 MG PO TABS: 40.0000 mg | ORAL_TABLET | Freq: Every day | ORAL | Status: DC

## 2017-09-07 MED ORDER — LOSARTAN POTASSIUM 25 MG PO TABS: 12.5000 mg | ORAL_TABLET | Freq: Every day | ORAL | 1 refills | Status: DC

## 2017-09-07 MED ORDER — AMIODARONE HCL 200 MG PO TABS
200.0000 mg | ORAL_TABLET | Freq: Two times a day (BID) | ORAL | Status: DC
  Administered 2017-09-07: 200 mg via ORAL
  Filled 2017-09-07: qty 1

## 2017-09-07 NOTE — Care Management Note (Addendum)
Case Management Note Marvetta Gibbons RN, BSN Unit 4E-Case Manager [phone]  Patient Details  Name: Bernard Kim MRN: [id number] Date of Birth: 08-May-1931  Subjective/Objective:    Pt admitted with acute resp. Failure- acute PE, CHF- need for Bipap, IV Amio and IV heparin.                 Action/Plan: PTA Pt lived at home with spouse- has supportive family nearby- CM to follow for transition to recovery needs.   Expected Discharge Date:  09/07/17               Expected Discharge Plan:  [place]  In-House Referral:  NA  Discharge planning Services  CM Consult, Medication Assistance  Post Acute Care Choice:  Durable Medical Equipment, Home Health Choice offered to:  Adult Children, Patient  DME Arranged:  [place] [facility] Agency:  [place] Arranged:  RN, PT, OT [facility] Agency:  [place]  Status of Service:  Completed, signed off  If discussed at [place] of Stay Meetings, dates discussed:    Discharge Disposition: home/home health   Additional Comments:  09/07/17- 1100- [name] RN, CM- pt for d/c home today with [facility] and DME needs- orders placed for HHRN/PT/OT- and RW- spoke with pt and daughter at bedside choice offered for [facility] agencies in [facility]- per choice they would like to use [facility] for DME and HH needs- referral called to [place] with [facility] for HHRN/PT/OT and RW- DME to be delivered to room prior to discharge- pt also started on Eliquis- unable to do benefits check for copay cost due to holiday- pt provided 30 day free card to use today on discharge- will check with pharmacy for copay cost.  Post discharge received call from [facility]- they are not servicing pt's address- and unable to take referral - per Brenton Grills they have contacted Tommi Rumps with [facility] who has accepted referral  For [facility] services and will contact family.   Dawayne Patricia, RN 09/07/2017, 11:12 AM

## 2017-09-07 NOTE — Discharge Instructions (Signed)
You have been made an appointment with a lung doctor to look into doing a sleep study to see if you have sleep apnea.

## 2017-09-07 NOTE — Discharge Summary (Signed)
[facility]  Physician Discharge Summary   Patient ID: Bernard Kim MRN: [id number] DOB/AGE: 81-02-1931 81 y.o.  Admit date: 08/31/2017 Discharge date: 09/07/2017  PCP: Marco Collie, MD  DISCHARGE DIAGNOSES:  Principal Problem:   Acute respiratory failure with hypoxia [facility]) Active Problems:   Coronary artery disease   Diabetes mellitus, type 2 ([place])   Essential hypertension   Hypertension   Non-rheumatic mitral regurgitation   Dyspnea   Acute CHF (congestive heart failure) ([place])   Pulmonary embolism (HCC)   Apneic episode   Paroxysmal A-fib (HCC)   Atrial fibrillation with RVR (HCC)   Transaminitis   RECOMMENDATIONS FOR OUTPATIENT FOLLOW UP: 1. Cardiology to arrange outpatient follow-up.   DISCHARGE CONDITION: fair  Diet recommendation: Modified carbohydrate  Filed Weights   09/05/17 0400 09/06/17 0626 09/07/17 0505  Weight: 77 kg (169 lb 11.2 oz) 76 kg (167 lb 8 oz) 76.8 kg (169 lb 4.8 oz)    INITIAL HISTORY: 83 year old Caucasian male with past medical history of coronary artery disease status post remote PCI, history of stroke, diabetes mellitus type 2, hypertension, bladder cancer, initially admitted to [facility] with complains of shortness of breath.  Found to have decompensated systolic CHF along with atrial fibrillation.  Transferred here due to unstable angina.  Patient also had respiratory decompensation requiring BiPAP.  Patient underwent cardiac catheterization.  Also seen by pulmonology.  Consultants: Cardiology.  Pulmonology.  Procedures:   Transthoracic echocardiogram Study Conclusions  - Left ventricle: The cavity size was normal. There was moderate concentric hypertrophy. Systolic function was severely reduced. The estimated ejection fraction was in the range of 25% to 30%. Diffuse hypokinesis wose in the anterior myocardium. - Aortic valve: Valve mobility was restricted. There was mild stenosis. There was moderate  regurgitation. Peak velocity (S): 286 cm/s. Mean gradient (S): 16 mm Hg. - Mitral valve: Transvalvular velocity was within the normal range. There was no evidence for stenosis. There was mild to moderate regurgitation. - Left atrium: The atrium was severely dilated. - Right ventricle: The cavity size was normal. Wall thickness was normal. Systolic function was normal. - Right atrium: The atrium was mildly dilated. - Tricuspid valve: There was moderate regurgitation. - Pulmonary arteries: Systolic pressure was severely increased. PA peak pressure: 80 mm Hg (S). - Pericardium, extracardiac: A trivial pericardial effusion was identified.  Impressions:  - According to the aortic valve gradient, aortic stenosis is mild. However, given his reduced systolic function and restricted aortic valve mobiliy, cannot rule out low-flow, low-gradient aortic stenosis. Consider dobutamine stress echo if clinically indicated.  Cardiac catheterization Conclusion     Prox RCA lesion is 40% stenosed.  Mid RCA lesion is 30% stenosed.  Dist RCA lesion is 60% stenosed.  Post Atrio lesion is 85% stenosed.  Mid LM to Dist LM lesion is 40% stenosed.  Prox LAD lesion is 60% stenosed.  Ost 1st Diag to 1st Diag lesion is 30% stenosed.  Prox Cx to Mid Cx lesion is 20% stenosed.  Findings:  Ao = 121/58 (82)  LV = 146/29 RA = 12 RV = 56/14 PA = 57/21 (33) PCW = 31 (v=45) Fick cardiac output/index = 4.2/2.2 PVR = < 1.0 WU SVR = 1325 Ao sat = 99% PA sat = 66%, 67%  AoV peak-to-peak gradient = 69mmHG  Assessment: 1. 3v CAD with high-grade lesion in distal RCA otherwise non-obstructive disease 2. 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 Elevated filling pressures with prominent v-waves in PCWP tracing  suggestive of mitral regurgitation     HOSPITAL COURSE:   Acute on chronic systolic CHF Ejection fraction found to be about 10%.   Cardiology was consulted.  Patient was placed on diuretics.  He has been diuresing well.  His weight has been decreasing.    Was noted to be hypotensive resulting in change to the dose of his medications.  His diuretics were held for 1 day.  Seen by cardiology today and cleared for discharge.  He will be discharged on Lasix and low-dose losartan.  No beta-blocker due to acute decompensation.    Atrial fibrillation with RVR Atrial fibrillation could be contributing to cardiomyopathy.  Patient was started on intravenous amiodarone.  He was initially on IV heparin and then transitioned to Eliquis.  Changed over to oral amiodarone.  History of coronary artery disease status post stenting Continue aspirin.  Cardiac catheterization done during this hospitalization did show disease in the RCA and medical management is being pursued.  Definitive intervention to be considered in the future.  Plavix was noted on patient's home medication list.  Since he has been started on Eliquis, Plavix can be discontinued.  Acute respiratory failure with hypoxia/apneic spells Most likely secondary to CHF.  Patient initially required BiPAP.  Pulmonology was consulted.  Patient has been weaned off of BiPAP.  Pulmonology has arranged outpatient follow-up for sleep study.  Acute pulmonary embolism  This was noted while he was at [facility].  Currently on anticoagulation.  Acute kidney injury/hyperkalemia/metabolic acidosis Baseline creatinine not known.  Presented with creatinine of 1.9 which increased to 2.29.  Creatinine has been slowly improving.  Metabolic acidosis has improved.    Potassium level is now normal.  Diabetes mellitus type 2 HbA1c 8.0.  Continue with Lantus pen at home.  History of essential hypertension with hypotension Medications have been adjusted.  Patient is asymptomatic.  Transaminitis Most likely due to passive congestion from CHF.  Hepatitis panel unremarkable.  LFTs are  improving.    Mild thrombocytopenia No evidence for overt bleeding.  Counts are stable.  Overall stable.  Okay for discharge home today per cardiology.  Home health has been ordered.     PERTINENT LABS:  The results of significant diagnostics from this hospitalization (including imaging, microbiology, ancillary and laboratory) are listed below for reference.    Microbiology: Recent Results (from the past 240 hour(s))  Culture, blood (Routine X 2) w Reflex to ID Panel     Status: None   Collection Time: 09/01/17 11:27 AM  Result Value Ref Range Status   Specimen Description BLOOD LEFT ANTECUBITAL  Final   Special Requests   Final    BOTTLES DRAWN AEROBIC ONLY Blood Culture adequate volume   Culture NO GROWTH 5 DAYS  Final   Report Status 09/06/2017 FINAL  Final  Culture, blood (Routine X 2) w Reflex to ID Panel     Status: None   Collection Time: 09/01/17 11:31 AM  Result Value Ref Range Status   Specimen Description BLOOD LEFT ARM  Final   Special Requests   Final    BOTTLES DRAWN AEROBIC ONLY Blood Culture adequate volume   Culture NO GROWTH 5 DAYS  Final   Report Status 09/06/2017 FINAL  Final  Culture, expectorated sputum-assessment     Status: None   Collection Time: 09/01/17  1:04 PM  Result Value Ref Range Status   Specimen Description SPUTUM  Final   Special Requests Immunocompromised  Final   Sputum evaluation   Final  Sputum specimen not acceptable for testing.  Please recollect.   Gram Stain Report Called to,Read Back By and Verified With: RN Dola Factor [phone] MLM    Report Status 09/01/2017 FINAL  Final  Culture, Urine     Status: None   Collection Time: 09/02/17  4:46 AM  Result Value Ref Range Status   Specimen Description URINE, CATHETERIZED  Final   Special Requests NONE  Final   Culture NO GROWTH  Final   Report Status 09/03/2017 FINAL  Final     Labs: Basic Metabolic Panel: Recent Labs  Lab 09/02/17 0957 09/03/17 0400 09/04/17 0352  09/05/17 0232 09/06/17 0202 09/07/17 0204  NA  --  139 139 138 135 134*  K  --  3.1* 3.4* 3.7 3.8 4.1  CL  --  103 103 99* 95* 94*  CO2  --  27 27 30 29 31   GLUCOSE  --  152* 154* 161* 122* 143*  BUN  --  42* 29* 25* 23* 25*  CREATININE  --  1.50* 1.45* 1.40* 1.41* 1.55*  CALCIUM  --  7.9* 8.1* 8.4* 8.3* 8.3*  MG  --  1.8  --  1.8  --   --   PHOS 3.5  --   --   --   --   --    Liver Function Tests: Recent Labs  Lab 09/03/17 0400 09/04/17 0352 09/05/17 0232 09/06/17 0202 09/07/17 0204  AST 239* 162* 112* 68* 38  ALT 347* 283* 246* 180* 124*  ALKPHOS 102 96 97 91 82  BILITOT 1.9* 1.7* 1.4* 1.6* 1.7*  PROT 5.9* 5.8* 6.2* 6.1* 5.8*  ALBUMIN 3.4* 3.4* 3.5 3.4* 3.1*   CBC: Recent Labs  Lab 08/31/17 1934 09/02/17 0035 09/03/17 0400 09/04/17 0352 09/07/17 0204  WBC 11.7* 9.0 6.4 5.8 9.8  NEUTROABS 9.4*  --   --   --   --   HGB 16.6 14.2 14.1 13.9 14.9  HCT 49.4 41.9 42.8 41.1 45.1  MCV 94.1 91.9 93.0 91.9 92.4  PLT 140* 119* 110* 104* 129*   Cardiac Enzymes: Recent Labs  Lab 08/31/17 1934 09/01/17 0251 09/01/17 0813  TROPONINI 0.05* 0.05* 0.04*   BNP: BNP (last 3 results) Recent Labs    08/31/17 1934  BNP 586.5*     CBG: Recent Labs  Lab 09/06/17 1115 09/06/17 1603 09/06/17 2121 09/07/17 0630 09/07/17 1112  GLUCAP 174* 144* 179* 150* 214*     IMAGING STUDIES Dg Chest 2 View  Result Date: 09/03/2017 CLINICAL DATA:  Shortness of Breath EXAM: CHEST  2 VIEW COMPARISON:  09/01/2017 FINDINGS: Cardiomegaly with vascular congestion. Small bilateral effusions with bibasilar atelectasis. No acute bony abnormality. IMPRESSION: Cardiomegaly, vascular congestion. Bibasilar atelectasis and small effusions. Electronically Signed   By: Rolm Baptise M.D.   On: 09/03/2017 08:28   Dg Chest Port 1 View  Result Date: 09/01/2017 CLINICAL DATA:  83 year old male with a history of shortness of breath EXAM: PORTABLE CHEST 1 VIEW COMPARISON:  08/31/2017, 08/30/2017,  CT 08/30/2017 FINDINGS: Cardiomediastinal silhouette unchanged with cardiomegaly. Fullness in the central vasculature. Thickening of the minor fissure. Interlobular septal thickening. Blunting of the right costophrenic angle with hazy opacity at the right lung base. No pneumothorax. IMPRESSION: Evidence of mild pulmonary edema and small right pleural effusion. Cardiomegaly Electronically Signed   By: Corrie Mckusick D.O.   On: 09/01/2017 10:32    DISCHARGE EXAMINATION: Vitals:   09/06/17 1601 09/06/17 1955 09/07/17 0008 09/07/17 0505  BP: 106/60 110/68 Marland Kitchen)  101/53 108/66  Pulse: 69 (!) 58 70 70  Resp: 20 (!) 24 20 (!) 22  Temp: 98.4 F (36.9 C) 99 F (37.2 C) 98.6 F (37 C) 98.5 F (36.9 C)  TempSrc: Oral Oral Oral Oral  SpO2: 95% 99% 93% 98%  Weight:    76.8 kg (169 lb 4.8 oz)  Height:       General appearance: alert, cooperative, appears stated age and no distress Resp: Few crackles at the bases.  Mostly clear to auscultation.  Normal effort. Cardio: S1-S2 is irregularly irregular. GI: soft, non-tender; bowel sounds normal; no masses,  no organomegaly  DISPOSITION: Home  Discharge Instructions    (HEART FAILURE PATIENTS) Call MD:  Anytime you have any of the following symptoms: 1) 3 pound weight gain in 24 hours or 5 pounds in 1 week 2) shortness of breath, with or without a dry hacking cough 3) swelling in the hands, feet or stomach 4) if you have to sleep on extra pillows at night in order to breathe.   Complete by:  As directed    Call MD for:  extreme fatigue   Complete by:  As directed    Call MD for:  persistant nausea and vomiting   Complete by:  As directed    Call MD for:  severe uncontrolled pain   Complete by:  As directed    Call MD for:  temperature >100.4   Complete by:  As directed    Diet Carb Modified   Complete by:  As directed    Discharge instructions   Complete by:  As directed    Take your medications as prescribed. Cardiology will arrange outpatient  appointments.  You were cared for by a hospitalist during your hospital stay. If you have any questions about your discharge medications or the care you received while you were in the hospital after you are discharged, you can call the unit and asked to speak with the hospitalist on call if the hospitalist that took care of you is not available. Once you are discharged, your primary care physician will handle any further medical issues. Please note that NO REFILLS for any discharge medications will be authorized once you are discharged, as it is imperative that you return to your primary care physician (or establish a relationship with a primary care physician if you do not have one) for your aftercare needs so that they can reassess your need for medications and monitor your lab values. If you do not have a primary care physician, you can call [phone] for a physician referral.   Increase activity slowly   Complete by:  As directed         Allergies as of 09/07/2017   No Known Allergies     Medication List    STOP taking these medications   clopidogrel 75 MG tablet Commonly known as:  PLAVIX   metoprolol tartrate 25 MG tablet Commonly known as:  LOPRESSOR     TAKE these medications   amiodarone 200 MG tablet Commonly known as:  PACERONE Take 1 tablet (200 mg total) by mouth 2 (two) times daily.   apixaban 5 MG Tabs tablet Commonly known as:  ELIQUIS Take 1 tablet (5 mg total) by mouth 2 (two) times daily.   aspirin EC 81 MG tablet Take 81 mg by mouth.   docusate sodium 100 MG capsule Commonly known as:  COLACE Take 100 mg by mouth.   furosemide 40 MG tablet Commonly known as:  LASIX Take 1 tablet (40 mg total) by mouth daily. And take additional 40mg  if weight gain of more than 3 lbs   Insulin Glargine 100 UNIT/ML Solostar Pen Commonly known as:  LANTUS Inject 10 Units into the skin daily at 10 pm.   losartan 25 MG tablet Commonly known as:  COZAAR Take 0.5 tablets  (12.5 mg total) by mouth daily. What changed:    how much to take  when to take this   lovastatin 40 MG tablet Commonly known as:  MEVACOR Take 40 mg by mouth at bedtime.   spironolactone 25 MG tablet Commonly known as:  ALDACTONE Take 0.5 tablets (12.5 mg total) by mouth daily.   TYLENOL 8 HOUR ARTHRITIS PAIN 650 MG CR tablet Generic drug:  acetaminophen Take 650 mg by mouth every 8 (eight) hours as needed for pain.            Durable Medical Equipment  (From admission, onward)        Start     Ordered   09/07/17 1105  For home use only DME Walker rolling  Once    Question:  Patient needs a walker to treat with the following condition  Answer:  Weakness   09/07/17 1104       Follow-up Information    Rigoberto Noel, MD Follow up on 09/29/2017.   Specialty:  Pulmonary Disease Why:  11:15 am Contact information: [address] [place] [address] [phone]        Bensimhon, Shaune Pascal, MD Follow up.   Specialty:  Cardiology Why:  his office will schedule appointment Contact information: [place] [address] [place], [place] Follow up.   Specialty:  [place] Why:  HHRN/PT/OT arranged- they will call you to set up home visits Contact information: [address] [place] [address] [phone]        [facility] - Dme Follow up.   Why:  rolling walker arranged- to be delivered to room prior to discharge Contact information: [place] [address] [phone]           TOTAL DISCHARGE TIME: 35 mins  [facility] Pager [phone]  09/07/2017, 2:23 PM

## 2017-09-07 NOTE — Progress Notes (Signed)
Physical Therapy Treatment Patient Details Name: Bernard Kim MRN: [id number] DOB: [birth year] Today's Date: 09/07/2017    History of Present Illness 83 year old male who was transferred from [facility] with new onset of ejection fraction 15%, considerable and progressive SOB, and under consideration for cardiac cath. PMH CAD, HTN, history apnea, DM, mitral regurg, A-fib, hx bladder cancer, PE treated with eliquis     PT Comments    Pt performed gait without RW during session as he reports at baseline using cane intermittently.  Gt remains unsteady but no true LOB.  Pt would still benefit from use of RW at home and at the very least cane.  Pt verbalized understanding of need for use of AD at d/c.  Pt performed stair training in prep for entry into home.  Plan to d/c home with HHPT this afternoon.  D/C recs remain appropriate.   Follow Up Recommendations  Home health PT     Equipment Recommendations  Rolling walker with 5" wheels    Recommendations for Other Services       Precautions / Restrictions Precautions Precautions: Fall Restrictions Weight Bearing Restrictions: No    Mobility  Bed Mobility Overal bed mobility: Modified Independent             General bed mobility comments: No assistance needed, patient performed with increased time.    Transfers Overall transfer level: Needs assistance Equipment used: None Transfers: Sit to/from Stand Sit to Stand: Min guard         General transfer comment: cues for hand placement   Ambulation/Gait Ambulation/Gait assistance: Min guard Ambulation Distance (Feet): 300 Feet Assistive device: None(intermittent use of railing in halls. ) Gait Pattern/deviations: WFL(Within Functional Limits)   Gait velocity interpretation: Below normal speed for age/gender General Gait Details: Pt unsteady but no true LOB, cues for reciprocal armswing during gait training.  Pt with decreased stride on L due to pain in R ankle  (arthritic).     Stairs Stairs: Yes   Stair Management: One rail Right;Alternating pattern;Step to pattern Number of Stairs: 4 General stair comments: Cues for hand placement on railing and sequencing.  Pt performed alternation pattern to ascend and step to pattern to descend.  Min guard for safety.    Wheelchair Mobility    Modified Rankin (Stroke Patients Only)       Balance                                            Cognition Arousal/Alertness: Awake/alert Behavior During Therapy: WFL for tasks assessed/performed Overall Cognitive Status: Within Functional Limits for tasks assessed                                        Exercises      General Comments        Pertinent Vitals/Pain Pain Assessment: Faces Faces Pain Scale: Hurts a little bit Pain Location: R ankle Pain Descriptors / Indicators: Discomfort Pain Intervention(s): Limited activity within patient's tolerance;Repositioned    Home Living                      Prior Function            PT Goals (current goals can now be found in the care  plan section) Acute Rehab PT Goals Patient Stated Goal: to get better and go home  Potential to Achieve Goals: Good Progress towards PT goals: Progressing toward goals    Frequency    Min 2X/week      PT Plan Current plan remains appropriate    Co-evaluation              AM-PAC PT "6 Clicks" Daily Activity  Outcome Measure  Difficulty turning over in bed (including adjusting bedclothes, sheets and blankets)?: None Difficulty moving from lying on back to sitting on the side of the bed? : None Difficulty sitting down on and standing up from a chair with arms (e.g., wheelchair, bedside commode, etc,.)?: A Little Help needed moving to and from a bed to chair (including a wheelchair)?: A Little Help needed walking in hospital room?: A Little Help needed climbing 3-5 steps with a railing? : A Little 6 Click  Score: 20    End of Session Equipment Utilized During Treatment: Gait belt   Patient left: with family/visitor present;with call bell/phone within reach;in bed Nurse Communication: Other (comment)(telemetry disconnnected on arrival) PT Visit Diagnosis: Muscle weakness (generalized) (M62.81);Unsteadiness on feet (R26.81)     Time: 7341-9379 PT Time Calculation (min) (ACUTE ONLY): 13 min  Charges:  $Gait Training: 8-22 mins                    G Codes:       Governor Rooks, PTA pager [phone]    Cristela Blue 09/07/2017, 10:06 AM

## 2017-09-07 NOTE — Plan of Care (Signed)
  Progressing Education: Knowledge of General Education information will improve 09/07/2017 0007 - Progressing by Blair Promise, RN Health Behavior/Discharge Planning: Ability to manage health-related needs will improve 09/07/2017 0007 - Progressing by Blair Promise, RN Clinical Measurements: Ability to maintain clinical measurements within normal limits will improve 09/07/2017 0007 - Progressing by Blair Promise, RN Will remain free from infection 09/07/2017 0007 - Progressing by Blair Promise, RN Diagnostic test results will improve 09/07/2017 0007 - Progressing by Blair Promise, RN Cardiovascular complication will be avoided 09/07/2017 0007 - Progressing by Blair Promise, RN Activity: Risk for activity intolerance will decrease 09/07/2017 0007 - Progressing by Blair Promise, RN Elimination: Will not experience complications related to bowel motility 09/07/2017 0007 - Progressing by Blair Promise, RN Will not experience complications related to urinary retention 09/07/2017 0007 - Progressing by Blair Promise, RN Safety: Ability to remain free from injury will improve 09/07/2017 0007 - Progressing by Blair Promise, RN Skin Integrity: Risk for impaired skin integrity will decrease 09/07/2017 0007 - Progressing by Blair Promise, RN Education: Ability to demonstrate management of disease process will improve 09/07/2017 0007 - Progressing by Blair Promise, RN Ability to verbalize understanding of medication therapies will improve 09/07/2017 0007 - Progressing by Blair Promise, RN Activity: Capacity to carry out activities will improve 09/07/2017 0007 - Progressing by Blair Promise, RN Cardiac: Ability to achieve and maintain adequate cardiopulmonary perfusion will improve 09/07/2017 0007 - Progressing by Blair Promise, RN

## 2017-09-07 NOTE — Progress Notes (Signed)
Advanced Heart Failure Rounding Note  PCP:  Primary Cardiologist: Dr Haroldine Laws   Subjective:    Remains on amio gtt. Maintaining NSR with PACs.  Yesterday lasix stopped. Received 250 cc fluid bolus.   Complaining about L forearm pain at the IV site. Denies SOB/CP /dizziness. No orthopnea or PND. Wants to go home.   Echo 12/20: LVEF 25-30%, diffuse hypokinesis worse anteriorly, mild AS, severe LAE, PAPP 80 mm Hg, normal RV function   Objective:   Weight Range: 169 lb 4.8 oz (76.8 kg) Body mass index is 25.74 kg/m.   Vital Signs:   Temp:  [97.5 F (36.4 C)-99 F (37.2 C)] 98.5 F (36.9 C) (12/24 0505) Pulse Rate:  [58-71] 70 (12/24 0505) Resp:  [20-29] 22 (12/24 0505) BP: (88-110)/(53-75) 108/66 (12/24 0505) SpO2:  [91 %-99 %] 98 % (12/24 0505) Weight:  [169 lb 4.8 oz (76.8 kg)] 169 lb 4.8 oz (76.8 kg) (12/24 0505) Last BM Date: 09/05/17  Weight change: Filed Weights   09/05/17 0400 09/06/17 0626 09/07/17 0505  Weight: 169 lb 11.2 oz (77 kg) 167 lb 8 oz (76 kg) 169 lb 4.8 oz (76.8 kg)    Intake/Output:   Intake/Output Summary (Last 24 hours) at 09/07/2017 0832 Last data filed at 09/06/2017 2139 Gross per 24 hour  Intake 712 ml  Output 1050 ml  Net -338 ml      Physical Exam   General:  NAD. No resp difficulty. In bed.  HEENT: normal Neck: supple. JVP 5-6 Carotids 2+ bilat; no bruits. No lymphadenopathy or thryomegaly appreciated. Cor: PMI nondisplaced. Regular rate & rhythm. No rubs, gallops. 2/6 AS. Lungs: clear on room air.  Abdomen: soft, nontender, nondistended. No hepatosplenomegaly. No bruits or masses. Good bowel sounds. Extremities: no cyanosis, clubbing, rash, edema. LUE forearm erythema at insertion site. Extending 4 cm.  Neuro: alert & orientedx3, cranial nerves grossly intact. moves all 4 extremities w/o difficulty. Affect pleasant  Telemetry  NSR with PACs 70s personally reviewed.   EKG Today NSR 71 bpm.   Labs    CBC Recent Labs   09/07/17 0204  WBC 9.8  HGB 14.9  HCT 45.1  MCV 92.4  PLT 951*   Basic Metabolic Panel Recent Labs    09/05/17 0232 09/06/17 0202 09/07/17 0204  NA 138 135 134*  K 3.7 3.8 4.1  CL 99* 95* 94*  CO2 30 29 31   GLUCOSE 161* 122* 143*  BUN 25* 23* 25*  CREATININE 1.40* 1.41* 1.55*  CALCIUM 8.4* 8.3* 8.3*  MG 1.8  --   --    Liver Function Tests Recent Labs    09/06/17 0202 09/07/17 0204  AST 68* 38  ALT 180* 124*  ALKPHOS 91 82  BILITOT 1.6* 1.7*  PROT 6.1* 5.8*  ALBUMIN 3.4* 3.1*   No results for input(s): LIPASE, AMYLASE in the last 72 hours. Cardiac Enzymes No results for input(s): CKTOTAL, CKMB, CKMBINDEX, TROPONINI in the last 72 hours.  BNP: BNP (last 3 results) Recent Labs    08/31/17 1934  BNP 586.5*    ProBNP (last 3 results) No results for input(s): PROBNP in the last 8760 hours.   D-Dimer No results for input(s): DDIMER in the last 72 hours. Hemoglobin A1C No results for input(s): HGBA1C in the last 72 hours. Fasting Lipid Panel No results for input(s): CHOL, HDL, LDLCALC, TRIG, CHOLHDL, LDLDIRECT in the last 72 hours. Thyroid Function Tests No results for input(s): TSH, T4TOTAL, T3FREE, THYROIDAB in the last 72 hours.  Invalid input(s): FREET3  Other results:   Imaging    No results found.   Medications:     Scheduled Medications: . apixaban  5 mg Oral BID  . aspirin EC  81 mg Oral Daily  . insulin aspart  0-5 Units Subcutaneous QHS  . insulin aspart  0-9 Units Subcutaneous TID WC  . sodium chloride flush  3 mL Intravenous Q12H  . sodium chloride flush  3 mL Intravenous Q12H  . spironolactone  12.5 mg Oral Daily    Infusions: . sodium chloride Stopped (09/03/17 2047)  . sodium chloride    . amiodarone 30 mg/hr (09/06/17 1841)    PRN Medications: sodium chloride, sodium chloride, acetaminophen, ondansetron (ZOFRAN) IV, sodium chloride flush, sodium chloride flush    Patient Profile   Fleet C. 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  Assessment/Plan  1. Acute decompensation HFrEF:Likely secondary to onset of Afib with RVR. Few episodes of afib overnight while on amio gtt. Echo 12/20 with LVEF 25-30%. LHC yesterday showed 3 vessel disease with high grade lesion on dRCA that will be medically managed given no angina or CM. -Weight up 2 pounds. He does not appear volume overloaded. He was not on diuretics prior to admit.  - Add 40 mg lasix daily with extra 40 mg for  3 pound gain in 24 hours.   - No beta blocker for now in the setting of acute decompensation.  -Add 12.5 mg losartan 12. 5 mg daily.  - Continue spiro 12.5 mg daily    2. Atrial fibrillation:  - Today he is maintaining NSR with PACs. Stop amio drip. Start amio 200 mg twice a day. . Continue  Eliquis.   3. PE: small, R -sided segmental PE seen on CTA chest.  - On Eliquis   4. CAD - s/p previous RCA stent. Cath from 12/20 with mostly non-obstructive CAD but 80-90% lesion in dRCA - no s/s angina Doubt lesion is related to LV dysfunction - continue medical treatment with statin - can consider PCI once LV function recovers from tachy CM  5. Acute hypoxic respiratory failure: in the setting of acute decompensation HF. Also considering ongoing infectious process given productive cough. Pan-cx per PCCM. NGTD. Oxygenting well on room air.  Sputum cx negative - Never smoker  - Will need outpatient sleep study to evaluate for OSA - On 3 liters sats 88-97%.    6. Transaminitis with abnormal INR and thromboytopenia: Suspect secondary to shock liver from presumed hypotension while OSH vs hepatic congestion.     - Transaminases on admit. Improving.   7. Thrombocytopenia: in the setting of liver dysfunction but also stared on heparin gtt 3 days ago. Plt 140 -->  119 --> 110--> 104->129  Improving.   - 8. AKI: with hyperkalemia and HAGMA.  Metabolic acidosis resolved. Renal function improving.  - Improving with resolution of shock - Stable. Creatinine 1.5.  9. T2DM: well controlled. Last A1c 8 09/01/2017.  - On SSI per primary   10. Bladder cancer: on treatment with BCG. Last treatment on 11/19 per urology note. Follows up with Dr. Rosana Hoes.  11. LUE IV infiltrate- stopping IV amio. Remove IV now. Discussed with nurse.   Medication concerns reviewed with patient and pharmacy team.   I will set  up HF follow up.    HF meds for d/c Lasix 40 mg daily + 40 mg for 3 pound weight gain. Amio 200 mg twice a day.  Apixaban 5 mg twice a day Spironolactone 12.5 mg daily.  Losartan 12.5 mg daily    Length of Stay: 7  Amy Clegg, NP  09/07/2017, 8:32 AM  Advanced Heart Failure Team Pager [phone] (M-F; 7a - 4p)  Please contact [facility] for night-coverage after hours (4p -7a ) and weekends on [email or web address]  Patient seen and examined with Darrick Grinder, NP. We discussed all aspects of the encounter. I agree with the assessment and plan as stated above.   He looks good today. Maintaining NSR on amio. Volume status looks good. Ambulating. Ok to d/c home today with close f/u in HF Clinic.   Glori Bickers, MD  10:04 AM

## 2017-09-10 DIAGNOSIS — I110 Hypertensive heart disease with heart failure: Secondary | ICD-10-CM | POA: Diagnosis not present

## 2017-09-10 DIAGNOSIS — I5023 Acute on chronic systolic (congestive) heart failure: Secondary | ICD-10-CM | POA: Diagnosis not present

## 2017-09-10 DIAGNOSIS — E119 Type 2 diabetes mellitus without complications: Secondary | ICD-10-CM | POA: Diagnosis not present

## 2017-09-10 DIAGNOSIS — I2510 Atherosclerotic heart disease of native coronary artery without angina pectoris: Secondary | ICD-10-CM | POA: Diagnosis not present

## 2017-09-10 DIAGNOSIS — I480 Paroxysmal atrial fibrillation: Secondary | ICD-10-CM | POA: Diagnosis not present

## 2017-09-18 ENCOUNTER — Encounter (HOSPITAL_COMMUNITY): Payer: Self-pay | Admitting: Internal Medicine

## 2017-09-18 ENCOUNTER — Ambulatory Visit (HOSPITAL_COMMUNITY)
Admission: RE | Admit: 2017-09-18 | Discharge: 2017-09-18 | Disposition: A | Discharge status: Home / Self Care | Payer: Medicare Other | Source: Ambulatory Visit | Attending: Internal Medicine | Admitting: Internal Medicine

## 2017-09-18 VITALS — BP 122/78 | HR 75 | Wt 166.1 lb

## 2017-09-18 DIAGNOSIS — I5021 Acute systolic (congestive) heart failure: Secondary | ICD-10-CM | POA: Diagnosis present

## 2017-09-18 DIAGNOSIS — Z7982 Long term (current) use of aspirin: Secondary | ICD-10-CM | POA: Diagnosis not present

## 2017-09-18 DIAGNOSIS — Z7901 Long term (current) use of anticoagulants: Secondary | ICD-10-CM | POA: Insufficient documentation

## 2017-09-18 DIAGNOSIS — E1122 Type 2 diabetes mellitus with diabetic chronic kidney disease: Secondary | ICD-10-CM | POA: Diagnosis not present

## 2017-09-18 DIAGNOSIS — Z8673 Personal history of transient ischemic attack (TIA), and cerebral infarction without residual deficits: Secondary | ICD-10-CM | POA: Diagnosis not present

## 2017-09-18 DIAGNOSIS — I5022 Chronic systolic (congestive) heart failure: Secondary | ICD-10-CM

## 2017-09-18 DIAGNOSIS — Z8551 Personal history of malignant neoplasm of bladder: Secondary | ICD-10-CM | POA: Diagnosis not present

## 2017-09-18 DIAGNOSIS — I255 Ischemic cardiomyopathy: Secondary | ICD-10-CM | POA: Insufficient documentation

## 2017-09-18 DIAGNOSIS — N183 Chronic kidney disease, stage 3 (moderate): Secondary | ICD-10-CM | POA: Insufficient documentation

## 2017-09-18 DIAGNOSIS — Z955 Presence of coronary angioplasty implant and graft: Secondary | ICD-10-CM | POA: Insufficient documentation

## 2017-09-18 DIAGNOSIS — Z794 Long term (current) use of insulin: Secondary | ICD-10-CM | POA: Insufficient documentation

## 2017-09-18 DIAGNOSIS — Z79899 Other long term (current) drug therapy: Secondary | ICD-10-CM | POA: Diagnosis not present

## 2017-09-18 DIAGNOSIS — I480 Paroxysmal atrial fibrillation: Secondary | ICD-10-CM | POA: Diagnosis not present

## 2017-09-18 DIAGNOSIS — I130 Hypertensive heart and chronic kidney disease with heart failure and stage 1 through stage 4 chronic kidney disease, or unspecified chronic kidney disease: Secondary | ICD-10-CM | POA: Insufficient documentation

## 2017-09-18 DIAGNOSIS — I2510 Atherosclerotic heart disease of native coronary artery without angina pectoris: Secondary | ICD-10-CM

## 2017-09-18 LAB — CBC
HCT: 50.5 % (ref 39.0–52.0)
Hemoglobin: 17.1 g/dL — ABNORMAL HIGH (ref 13.0–17.0)
MCH: 30.6 pg (ref 26.0–34.0)
MCHC: 33.9 g/dL (ref 30.0–36.0)
MCV: 90.5 fL (ref 78.0–100.0)
PLATELETS: 209 10*3/uL (ref 150–400)
RBC: 5.58 MIL/uL (ref 4.22–5.81)
RDW: 12.8 % (ref 11.5–15.5)
WBC: 7.8 10*3/uL (ref 4.0–10.5)

## 2017-09-18 LAB — BASIC METABOLIC PANEL
ANION GAP: 12 (ref 5–15)
BUN: 22 mg/dL — ABNORMAL HIGH (ref 6–20)
CALCIUM: 9.3 mg/dL (ref 8.9–10.3)
CO2: 27 mmol/L (ref 22–32)
Chloride: 92 mmol/L — ABNORMAL LOW (ref 101–111)
Creatinine, Ser: 1.46 mg/dL — ABNORMAL HIGH (ref 0.61–1.24)
GFR, EST AFRICAN AMERICAN: 49 mL/min — AB (ref >60)
GFR, EST NON AFRICAN AMERICAN: 42 mL/min — AB (ref >60)
Glucose, Bld: 153 mg/dL — ABNORMAL HIGH (ref 65–99)
POTASSIUM: 4.2 mmol/L (ref 3.5–5.1)
SODIUM: 131 mmol/L — AB (ref 135–145)

## 2017-09-18 MED ORDER — LOSARTAN POTASSIUM 25 MG PO TABS: 25.0000 mg | ORAL_TABLET | Freq: Every day | ORAL | 3 refills | Status: DC

## 2017-09-18 MED ORDER — APIXABAN 2.5 MG PO TABS: 2.5000 mg | ORAL_TABLET | Freq: Two times a day (BID) | ORAL | 3 refills | Status: DC

## 2017-09-18 NOTE — Patient Instructions (Signed)
Labs today (will call for abnormal results, otherwise no news is good news)  STOP taking Aspirin  DECREASE Eliquis 2.5 mg Two Times Daily  INCREASE Losartan to 25 mg Once Daily  Follow up as scheduled in 4 weeks.

## 2017-09-18 NOTE — Progress Notes (Addendum)
ADVANCED HF CLINIC NOTE  Primary Cardiologist: Bettina Gavia  HPI:   Bernard Kim is a delightful 84 year old male with past medical history of coronary artery disease status post remote PCI, history of stroke, diabetes mellitus type 2, hypertension, bladder cancer, CKD stage III and ischemic CM with previous EF 40-45%  Admitted in 12/18 with new onset systolic HF and shock liver/kidney with EF ~25% in setting of new onset (unsure duration) AF.Felt to have tachy-induced CM with low output HF. Started on IV amio and Eliquis and eventually converted to NSR. Diuresed with IV lasix.  Found to have small incidental R-sided PE. Also underwent R/L cath as below with distal 85% RCA lesion that was treated medically.   Seen by PT and improved steadily. D/c weight 169 pounds,   Here for post-hospital f/u. Feels much better. Forgot to take his lasix for about first week. But has restarted.  weight stable. Doing all ADLs without problem. No palpitations. No edema, orthopnea or PND.  No CP. No bleeding with Eliquis. Loves [employer] basketball.  Studies:  12/18 Echo EF 25-30% Mild AS 12/18 LHC/RHC   Prox RCA lesion is 40% stenosed.  Mid RCA lesion is 30% stenosed.  Dist RCA lesion is 60% stenosed.  Post Atrio lesion is 85% stenosed.  Mid LM to Dist LM lesion is 40% stenosed.  Prox LAD lesion is 60% stenosed.  Ost 1st Diag to 1st Diag lesion is 30% stenosed.  Prox Cx to Mid Cx lesion is 20% stenosed.  Ao = 121/58 (82)  LV = 146/29 RA = 12 RV = 56/14 PA = 57/21 (33) PCW = 31 (v=45) Fick cardiac output/index = 4.2/2.2 PVR = < 1.0 WU SVR = 1325 Ao sat = 99% PA sat = 66%, 67%  AoV peak-to-peak gradient = 42mmHg  ROS: All systems negative except as listed in HPI, PMH and Problem List.  SH:  Social History   Socioeconomic History  . Marital status: Married    Spouse name: Not on file  . Number of children: Not on file  . Years of education: Not on file  . Highest education level: Not  on file  Social Needs  . Financial resource strain: Not on file  . Food insecurity - worry: Not on file  . Food insecurity - inability: Not on file  . Transportation needs - medical: Not on file  . Transportation needs - non-medical: Not on file  Occupational History  . Not on file  Tobacco Use  . Smoking status: Never Smoker  . Smokeless tobacco: Never Used  Substance and Sexual Activity  . Alcohol use: No    Frequency: Never  . Drug use: No  . Sexual activity: Not on file  Other Topics Concern  . Not on file  Social History Narrative  . Not on file    FH: No family history on file.  Past Medical History:  Diagnosis Date  . Cancer ([place])    bladdre cancer  . CHF (congestive heart failure) ([place])   . Coronary artery disease   . Dyspnea   . Hypertension   . Stroke [facility])     Current Outpatient Medications  Medication Sig Dispense Refill  . acetaminophen (TYLENOL 8 HOUR ARTHRITIS PAIN) 650 MG CR tablet Take 650 mg by mouth every 8 (eight) hours as needed for pain.    Marland Kitchen amiodarone (PACERONE) 200 MG tablet Take 1 tablet (200 mg total) by mouth 2 (two) times daily. 60 tablet 0  . apixaban (ELIQUIS)  5 MG TABS tablet Take 1 tablet (5 mg total) by mouth 2 (two) times daily. 60 tablet 1  . aspirin EC 81 MG tablet Take 81 mg by mouth.    . docusate sodium (COLACE) 100 MG capsule Take 100 mg by mouth.    . furosemide (LASIX) 40 MG tablet Take 1 tablet (40 mg total) by mouth daily. And take additional 40mg  if weight gain of more than 3 lbs 60 tablet 1  . Insulin Glargine (LANTUS) 100 UNIT/ML Solostar Pen Inject 10 Units into the skin daily at 10 pm.    . losartan (COZAAR) 25 MG tablet Take 0.5 tablets (12.5 mg total) by mouth daily. 30 tablet 1  . lovastatin (MEVACOR) 40 MG tablet Take 40 mg by mouth at bedtime.     Marland Kitchen spironolactone (ALDACTONE) 25 MG tablet Take 0.5 tablets (12.5 mg total) by mouth daily. 30 tablet 1   No current facility-administered medications for this encounter.      Vitals:   09/18/17 1508  BP: 122/78  Pulse: 75  SpO2: 97%  Weight: 166 lb 1.9 oz (75.4 kg)    PHYSICAL EXAM:  General:  Elderly. No resp difficulty HEENT: normal Neck: supple. JVP flat. Carotids 2+ bilaterally; no bruits. No lymphadenopathy or thryomegaly appreciated. Cor: PMI normal. Regular rate & rhythm. 2/6 AS S2 preserved Lungs: clear Abdomen: soft, nontender, nondistended. No hepatosplenomegaly. No bruits or masses. Good bowel sounds. Extremities: no cyanosis, clubbing, rash, edema. IV infiltration in LUE healing well Neuro: alert & orientedx3, cranial nerves grossly intact. Moves all 4 extremities w/o difficulty. Affect pleasant.   ECG: ectopic atrial rhythm 76 Personally reviewed   ASSESSMENT & PLAN:  1. Acute systolic HF - EF 42/59 56-38% in setting on new onset AF with RVR. Likely tachy-induced (previous EF 40-45% due to iCM) - Now back in NSR on amio. Suspect EF will improve - NYHA II-III - Volume status stable - Continue lasix 40mg  daily - Increase losartan to 25 daily - Continue spiro 12.5 mg daily - No beta blocker for now in the setting of acute decompensation.  - Check labs - Repeat echo in 2 months   2. Paroxysmal Atrial fibrillation:  - Maintaining NSR (has ectopic atrial rhythm today but rate controlled)  - Continue amio 200 mg twice a day. Decrease to 200 daily at next visit - Drop Eliquis to 2.5 bid (age and SCr ~1.5).  3. PE: small, R -sided segmental PE seen on CTA chest.  - Likely incidental finding - On Eliquis   4. CAD - s/p previous RCA stent. Cath from 09/03/17 with mostly non-obstructive CAD but 80-90% lesion in dRCA - no s/s angina Doubt lesion is related to LV dysfunction - continue medical treatment with statin. Can stop ASA with Eliquis  - if symptomatic can consider PCI once LV function recovers from tachy CM  5. CKD stabe III - baseline creatinine ~1.5.  - Recheck today   6. T2DM: well controlled. Last A1c 8  09/01/2017.  - Per primary  - Consider Jardiance  Total time spent 45 minutes. Over half that time spent discussing above.    Length of Stay: 0  Glori Bickers, MD  09/18/2017, 3:17 PM  Advanced Heart Failure Team Pager [phone] (M-F; 7a - 4p)  Please contact [facility] for night-coverage after hours (4p -7a ) and weekends on [email or web address]

## 2017-09-21 DIAGNOSIS — E785 Hyperlipidemia, unspecified: Secondary | ICD-10-CM | POA: Diagnosis not present

## 2017-09-21 DIAGNOSIS — E1149 Type 2 diabetes mellitus with other diabetic neurological complication: Secondary | ICD-10-CM | POA: Diagnosis not present

## 2017-09-21 DIAGNOSIS — I129 Hypertensive chronic kidney disease with stage 1 through stage 4 chronic kidney disease, or unspecified chronic kidney disease: Secondary | ICD-10-CM | POA: Diagnosis not present

## 2017-09-24 ENCOUNTER — Telehealth (HOSPITAL_COMMUNITY): Payer: Self-pay | Admitting: *Deleted

## 2017-09-24 NOTE — Telephone Encounter (Signed)
I called Amber the [facility] RN back and told her the plan.  Patient is not having any other neurological symptoms but she will let patient know if he does to report to the ED.    She will have patient hold losartan through the weekend and will call us back if his headaches improve.

## 2017-09-24 NOTE — Telephone Encounter (Signed)
Advanced Heart Failure Triage Encounter  Patient Name: Bernard Kim  Date of Call: 09/24/17  Problem: Severe Headaches  Home Health RN called stating patient has been complaining of severe headaches since changing his medications last week.  Patient was started on Losartan. Vitals stable at BP-112/68 and HR 62.     Plan:  Will forward to Barrington Ellison, PA to review medications and will call back if any changes from the HF clinic is needed.   Darron Doom, RN

## 2017-09-24 NOTE — Telephone Encounter (Signed)
HA occur in less than 2% of patients on losartan, but should try holding over the weekend to see if resolves.   Can take tylenol.   If patient having any neurological symptoms -> Blurred vision that does not improve, weakness on one side, slurred speech, or facial drooping, should go to ED or call 911 immediately.   Legrand Como [address] [place], PA-C 09/24/2017 10:41 AM

## 2017-09-28 DIAGNOSIS — I129 Hypertensive chronic kidney disease with stage 1 through stage 4 chronic kidney disease, or unspecified chronic kidney disease: Secondary | ICD-10-CM | POA: Diagnosis not present

## 2017-09-28 DIAGNOSIS — I5020 Unspecified systolic (congestive) heart failure: Secondary | ICD-10-CM | POA: Diagnosis not present

## 2017-09-28 DIAGNOSIS — E785 Hyperlipidemia, unspecified: Secondary | ICD-10-CM | POA: Diagnosis not present

## 2017-09-28 DIAGNOSIS — N182 Chronic kidney disease, stage 2 (mild): Secondary | ICD-10-CM | POA: Diagnosis not present

## 2017-09-29 ENCOUNTER — Ambulatory Visit: Payer: Medicare Other | Admitting: Pulmonary Disease

## 2017-09-29 ENCOUNTER — Encounter: Payer: Self-pay | Admitting: Pulmonary Disease

## 2017-09-29 DIAGNOSIS — I2699 Other pulmonary embolism without acute cor pulmonale: Secondary | ICD-10-CM

## 2017-09-29 DIAGNOSIS — R0681 Apnea, not elsewhere classified: Secondary | ICD-10-CM | POA: Diagnosis not present

## 2017-09-29 NOTE — Assessment & Plan Note (Signed)
Appears subsegmental. Anticoagulation for 3 months would be recommended He may need longer duration  for atrial fibrillation

## 2017-09-29 NOTE — Patient Instructions (Signed)
Blood thinner recommended until March for blood clot in the lung and then you can decide with Dr. Linna Hoff if required for the heart.  We will defer sleep study for now

## 2017-09-29 NOTE — Progress Notes (Signed)
Subjective:    Patient ID: Bernard Kim, male    DOB: Apr 27, 1931, 84 y.o.   MRN: [id number]  HPI  84 year old never smoker who was hospitalized as a transfer from [facility] in 40/9811 for acute systolic heart failure requiring transient BiPAP. CT angiogram showed small segmental pulmonary embolism in the right lower lobe and he was put on anticoagulation. 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 Right and left heart cath showed 85% RCA lesion that was treated medically.  PA pressure was high 57/21 but PVR was low last 1.0 wood units He was diuresed and improved, came off BiPAP and did not oxygen on discharge.   seems that there was some history obtained during hospitalization from his son about witnessed apneas and hence is referred for evaluation of obstructive sleep apnea And follow-up of pulmonary embolism  He has been doing well on the blood thinner except from nosebleeds. He reports mild headaches for the last 10 days.  Epworth sleepiness score is 4. Bedtime is around 9 PM, TV is on a timer and he falls asleep watching the ball game, reports 1-2 nocturnal awakenings for nocturia He denies excessive daytime snoring or fatigue. Number partner history is available, he does admit to mild snoring but he denies choking or gasping episodes that awoken him up during sleep    PMH-  prostate cancer, bladder cancer treated with BCG , coronary artery disease with a right RCA stent, diabetes mellitus, hypertension   Past Surgical History:  Procedure Laterality Date  . CARDIAC CATHETERIZATION    . RIGHT/LEFT HEART CATH AND CORONARY ANGIOGRAPHY N/A 09/03/2017   Procedure: RIGHT/LEFT HEART CATH AND CORONARY ANGIOGRAPHY;  Surgeon: Jolaine Artist, MD;  Location: [facility];  Service: Cardiovascular;  Laterality:  N/A;    No Known Allergies  Social History   Socioeconomic History  . Marital status: Married    Spouse name: Not on file  . Number of children: Not on file  . Years of education: Not on file  . Highest education level: Not on file  Social Needs  . Financial resource strain: Not on file  . Food insecurity - worry: Not on file  . Food insecurity - inability: Not on file  . Transportation needs - medical: Not on file  . Transportation needs - non-medical: Not on file  Occupational History  . Not on file  Tobacco Use  . Smoking status: Never Smoker  . Smokeless tobacco: Never Used  Substance and Sexual Activity  . Alcohol use: No    Frequency: Never  . Drug use: No  . Sexual activity: Not on file  Other Topics Concern  . Not on file  Social History Narrative  . Not on file     History reviewed. No pertinent family history.     Review of Systems  Positive for headaches and nasal bleeds as above Pedal edema has resolved  Constitutional: negative for anorexia, fevers and sweats  Eyes: negative for irritation, redness and visual disturbance  Ears, nose, mouth, throat, and face: negative for earaches, epistaxis, nasal congestion and sore throat  Respiratory: negative for cough, sputum and wheezing  Cardiovascular: negative for chest pain, dyspnea, lower extremity edema, orthopnea, palpitations and syncope  Gastrointestinal: negative for abdominal pain, constipation, diarrhea, melena, nausea and vomiting  Genitourinary:negative for  dysuria, frequency and hematuria  Hematologic/lymphatic: negative for bleeding, easy bruising and lymphadenopathy  Musculoskeletal:negative for arthralgias, muscle weakness and stiff joints  Neurological: negative for coordination problems, gait problems, headaches and weakness  Endocrine: negative for diabetic symptoms including polydipsia, polyuria and weight loss     Objective:   Physical Exam  Gen. Pleasant, elderly,well-nourished,  in no distress, normal affect ENT - nasal mucosa intact, no post nasal drip Neck: No JVD, no thyromegaly, no carotid bruits Lungs: no use of accessory muscles, no dullness to percussion, clear without rales or rhonchi  Cardiovascular: Rhythm regular, heart sounds  normal, no murmurs or gallops, no peripheral edema Abdomen: soft and non-tender, no hepatosplenomegaly, BS normal. Musculoskeletal: No deformities, no cyanosis or clubbing Neuro:  alert, non focal        Assessment & Plan:

## 2017-09-29 NOTE — Assessment & Plan Note (Addendum)
In the setting of acute systolic heart failure.  Possible that he had central apneas in the acute setting He does not of excessive daytime somnolence or fatigue, no witnessed apneas or snoring or choking or gasping episodes. As such he does not want a sleep evaluation at this time. He will call us back should symptoms surface

## 2017-10-09 ENCOUNTER — Other Ambulatory Visit (HOSPITAL_COMMUNITY): Payer: Self-pay | Admitting: *Deleted

## 2017-10-09 MED ORDER — AMIODARONE HCL 200 MG PO TABS: 200.0000 mg | ORAL_TABLET | Freq: Two times a day (BID) | ORAL | 3 refills | Status: DC

## 2017-10-12 DIAGNOSIS — C61 Malignant neoplasm of prostate: Secondary | ICD-10-CM | POA: Diagnosis not present

## 2017-10-12 DIAGNOSIS — Z8551 Personal history of malignant neoplasm of bladder: Secondary | ICD-10-CM | POA: Diagnosis not present

## 2017-10-12 DIAGNOSIS — N529 Male erectile dysfunction, unspecified: Secondary | ICD-10-CM | POA: Diagnosis not present

## 2017-10-12 DIAGNOSIS — Z8546 Personal history of malignant neoplasm of prostate: Secondary | ICD-10-CM | POA: Diagnosis not present

## 2017-10-12 DIAGNOSIS — C672 Malignant neoplasm of lateral wall of bladder: Secondary | ICD-10-CM | POA: Diagnosis not present

## 2017-10-19 ENCOUNTER — Other Ambulatory Visit: Payer: Self-pay | Admitting: Pharmacist

## 2017-10-19 NOTE — Patient Outreach (Signed)
[facility]) Care Management  10/19/2017  Bernard Kim 22-Mar-1931 [id number]  Patient was referred to [facility] Pharmacist by Dr Presley Raddle, for patient medication assistance evaluation, specifically for apixaban (Eliquis).    Successful phone outreach to patient, HIPAA details verified, and purpose of call explained---patient agrees to call.   Patient reports he has [place] [employer] plan.  He reports cost of Eliquis is okay at this time while paying co-pay but reports concerns with cost when he reaches coverage gap.   Patient assistance:  -Discussed Programmer, applications Help---patient reports income  exceeds requirements.   -Discussed [place], patient reports he meets income requirements---does have 3% out-of-pocket prescription spend requirement for Part D beneficiaries.      He reports he will evaluate his gross household income and consider applying for manufacturer patient assistance once he meets the out-of-pocket prescription spend requirement.    He confirms he already has the phone number for patient assistance program and plans to call the patient assistance program.   Plan:    Will close pharmacy episode at this time.   Patient [facility] Pharmacist phone number to contact when he meets requirements to apply for manufacturer patient assistance and reports he has his Eliquis at this time.   Will route this note to PCP to update Dr Nyra Capes.   Karrie Meres, PharmD, [place] [phone]

## 2017-10-21 ENCOUNTER — Encounter (HOSPITAL_COMMUNITY): Payer: Self-pay | Admitting: Internal Medicine

## 2017-10-21 ENCOUNTER — Ambulatory Visit (HOSPITAL_COMMUNITY)
Admission: RE | Admit: 2017-10-21 | Discharge: 2017-10-21 | Disposition: A | Discharge status: Home / Self Care | Payer: Medicare Other | Source: Ambulatory Visit | Attending: Internal Medicine | Admitting: Internal Medicine

## 2017-10-21 VITALS — BP 134/62 | HR 66 | Wt 169.0 lb

## 2017-10-21 DIAGNOSIS — Z794 Long term (current) use of insulin: Secondary | ICD-10-CM | POA: Diagnosis not present

## 2017-10-21 DIAGNOSIS — Z79899 Other long term (current) drug therapy: Secondary | ICD-10-CM | POA: Insufficient documentation

## 2017-10-21 DIAGNOSIS — I5021 Acute systolic (congestive) heart failure: Secondary | ICD-10-CM | POA: Insufficient documentation

## 2017-10-21 DIAGNOSIS — N183 Chronic kidney disease, stage 3 unspecified: Secondary | ICD-10-CM

## 2017-10-21 DIAGNOSIS — I2510 Atherosclerotic heart disease of native coronary artery without angina pectoris: Secondary | ICD-10-CM | POA: Insufficient documentation

## 2017-10-21 DIAGNOSIS — I130 Hypertensive heart and chronic kidney disease with heart failure and stage 1 through stage 4 chronic kidney disease, or unspecified chronic kidney disease: Secondary | ICD-10-CM | POA: Diagnosis present

## 2017-10-21 DIAGNOSIS — I5022 Chronic systolic (congestive) heart failure: Secondary | ICD-10-CM | POA: Diagnosis not present

## 2017-10-21 DIAGNOSIS — I480 Paroxysmal atrial fibrillation: Secondary | ICD-10-CM | POA: Insufficient documentation

## 2017-10-21 DIAGNOSIS — E1122 Type 2 diabetes mellitus with diabetic chronic kidney disease: Secondary | ICD-10-CM | POA: Diagnosis not present

## 2017-10-21 DIAGNOSIS — I350 Nonrheumatic aortic (valve) stenosis: Secondary | ICD-10-CM | POA: Diagnosis not present

## 2017-10-21 DIAGNOSIS — N184 Chronic kidney disease, stage 4 (severe): Secondary | ICD-10-CM

## 2017-10-21 LAB — BASIC METABOLIC PANEL
Anion gap: 11 (ref 5–15)
BUN: 19 mg/dL (ref 6–20)
CALCIUM: 9 mg/dL (ref 8.9–10.3)
CO2: 28 mmol/L (ref 22–32)
Chloride: 95 mmol/L — ABNORMAL LOW (ref 101–111)
Creatinine, Ser: 1.49 mg/dL — ABNORMAL HIGH (ref 0.61–1.24)
GFR, EST AFRICAN AMERICAN: 48 mL/min — AB (ref >60)
GFR, EST NON AFRICAN AMERICAN: 41 mL/min — AB (ref >60)
Glucose, Bld: 206 mg/dL — ABNORMAL HIGH (ref 65–99)
Potassium: 3.9 mmol/L (ref 3.5–5.1)
SODIUM: 134 mmol/L — AB (ref 135–145)

## 2017-10-21 MED ORDER — AMIODARONE HCL 200 MG PO TABS: 200.0000 mg | ORAL_TABLET | Freq: Every day | ORAL | 3 refills | Status: DC

## 2017-10-21 NOTE — Patient Instructions (Addendum)
Stop Spironolactone  Decrease Amiodarone to 200 mg  Lab today  Your physician recommends that you schedule a follow-up appointment in: 2 months with echocardiogram

## 2017-10-21 NOTE — Progress Notes (Signed)
ADVANCED HF CLINIC NOTE  Primary Cardiologist: Bettina Gavia  HPI:  Bernard Kim is a delightful 84 year old male with past medical history of coronary artery disease status post remote PCI, history of stroke, diabetes mellitus type 2, hypertension, bladder cancer, CKD stage III and ischemic CM with previous EF 40-45%  Admitted in 12/18 with new onset systolic HF and shock liver/kidney with EF ~25% in setting of new onset (unsure duration) AF.Felt to have tachy-induced CM with low output HF. Started on IV amio and Eliquis and eventually converted to NSR. Diuresed with IV lasix.  Found to have small incidental R-sided PE. Also underwent R/L cath as below with distal 85% RCA lesion that was treated medically.   Seen by PT and improved steadily. D/c weight 169 pounds,   Here for routine follow-up. Feels good. Able to do all ADLs without any problems. No DOE. No edema, orthopnea or PND. Main complaint is pruritic rash on trunk. Denies palpitations or bleeding.   Bedside echo done personally EF 35-40% heavily calcified AoV  Studies:  12/18 Echo EF 25-30% Mild AS 12/18 LHC/RHC   Prox RCA lesion is 40% stenosed.  Mid RCA lesion is 30% stenosed.  Dist RCA lesion is 60% stenosed.  Post Atrio lesion is 85% stenosed.  Mid LM to Dist LM lesion is 40% stenosed.  Prox LAD lesion is 60% stenosed.  Ost 1st Diag to 1st Diag lesion is 30% stenosed.  Prox Cx to Mid Cx lesion is 20% stenosed.  Ao = 121/58 (82)  LV = 146/29 RA = 12 RV = 56/14 PA = 57/21 (33) PCW = 31 (v=45) Fick cardiac output/index = 4.2/2.2 PVR = < 1.0 WU SVR = 1325 Ao sat = 99% PA sat = 66%, 67%  AoV peak-to-peak gradient = 73mmHg  ROS: All systems negative except as listed in HPI, PMH and Problem List.  SH:  Social History   Socioeconomic History  . Marital status: Married    Spouse name: Not on file  . Number of children: Not on file  . Years of education: Not on file  . Highest education level: Not on  file  Social Needs  . Financial resource strain: Not on file  . Food insecurity - worry: Not on file  . Food insecurity - inability: Not on file  . Transportation needs - medical: Not on file  . Transportation needs - non-medical: Not on file  Occupational History  . Not on file  Tobacco Use  . Smoking status: Never Smoker  . Smokeless tobacco: Never Used  Substance and Sexual Activity  . Alcohol use: No    Frequency: Never  . Drug use: No  . Sexual activity: Not on file  Other Topics Concern  . Not on file  Social History Narrative  . Not on file    FH: No family history on file.  Past Medical History:  Diagnosis Date  . Cancer ([place])    bladdre cancer  . CHF (congestive heart failure) ([place])   . Coronary artery disease   . Dyspnea   . Hypertension   . Stroke [facility])     Current Outpatient Medications  Medication Sig Dispense Refill  . acetaminophen (TYLENOL 8 HOUR ARTHRITIS PAIN) 650 MG CR tablet Take 650 mg by mouth every 8 (eight) hours as needed for pain.    Marland Kitchen amiodarone (PACERONE) 200 MG tablet Take 1 tablet (200 mg total) by mouth 2 (two) times daily. 60 tablet 3  . apixaban (ELIQUIS) 2.5 MG  TABS tablet Take 1 tablet (2.5 mg total) by mouth 2 (two) times daily. 60 tablet 3  . docusate sodium (COLACE) 100 MG capsule Take 100 mg by mouth.    . furosemide (LASIX) 40 MG tablet Take 1 tablet (40 mg total) by mouth daily. And take additional 40mg  if weight gain of more than 3 lbs 60 tablet 1  . Insulin Glargine (LANTUS) 100 UNIT/ML Solostar Pen Inject 10 Units into the skin daily at 10 pm.    . losartan (COZAAR) 25 MG tablet Take 12.5 mg by mouth daily.    Marland Kitchen lovastatin (MEVACOR) 40 MG tablet Take 40 mg by mouth at bedtime.     Marland Kitchen spironolactone (ALDACTONE) 25 MG tablet Take 25 mg by mouth daily.    Marland Kitchen spironolactone (ALDACTONE) 25 MG tablet Take 0.5 tablets (12.5 mg total) by mouth daily. 30 tablet 1   No current facility-administered medications for this encounter.      Vitals:   10/21/17 1441  BP: 134/62  Pulse: 66  SpO2: 99%  Weight: 169 lb (76.7 kg)    PHYSICAL EXAM:  General:  Well appearing. Elderly  No resp difficulty HEENT: normal Neck: supple. JVP 5Carotids 2+ bilat; + bilateral bruits. No lymphadenopathy or thryomegaly appreciated. Cor: PMI nondisplaced. Regular rate & rhythm. 3/6 AS with significantly reduced S2 Lungs: clear Abdomen: soft, nontender, nondistended. No hepatosplenomegaly. No bruits or masses. Good bowel sounds. Extremities: no cyanosis, clubbing, rash, edema Neuro: alert & orientedx3, cranial nerves grossly intact. moves all 4 extremities w/o difficulty. Affect pleasant Skin: Faint maculopapular rash on rtunk   ECG: ectopic atrial rhythm 76 Personally reviewed   ASSESSMENT & PLAN:  1. Acute systolic HF - EF 48/18 56-31% in setting on new onset AF with RVR. Likely tachy-induced (previous EF 40-45% due to iCM) - Stable NYHA II now back in NSR - Bedside echo today with EF 35-40% (improving) - Volume status looks good - Continue lasix 40mg  daily - Continue losartan to 12.5 daily (BP dropped with 25mg  daily) - Will stop spiro as he thinks it may be related to his rash  - No beta blocker yet.  - Check labs - Repeat formal echo next visit  2. Paroxysmal Atrial fibrillation:  - Maintaining NSR.  - Will decrease amio to 200 daily at next visit. (? If related to rash) - Continue Eliquis to 2.5 bid (age and SCr ~1.5).  3. PE: small, R -sided segmental PE seen on CTA chest.  - Likely incidental finding - On Eliquis   4. CAD - s/p previous RCA stent. Cath from 09/03/17 with mostly non-obstructive CAD but 80-90% lesion in dRCA - No s.s of angina.  Doubt lesion is related to LV dysfunction - continue medical treatment with statin. OffASA with Eliquis  - if symptomatic can consider PCI once LV function recovers from tachy CM  5. CKD stable III - baseline creatinine ~1.5.  - Recheck today   6. T2DM: well  controlled. Last A1c 8 09/01/2017.  - Per primary  - Consider Jardiance  7. Aortic stenosis - in moderate range. Will need to follow closely for possible TAVR.   8. [employer] basketball fan - seems like an incurable problem for him   Length of Stay: 0  Glori Bickers, MD  09/18/2017, 3:17 PM  Advanced Heart Failure Team Pager [phone] (M-F; 7a - 4p)  Please contact [facility] for night-coverage after hours (4p -7a ) and weekends on [email or web address]

## 2017-12-14 DIAGNOSIS — I129 Hypertensive chronic kidney disease with stage 1 through stage 4 chronic kidney disease, or unspecified chronic kidney disease: Secondary | ICD-10-CM | POA: Diagnosis not present

## 2017-12-14 DIAGNOSIS — E1149 Type 2 diabetes mellitus with other diabetic neurological complication: Secondary | ICD-10-CM | POA: Diagnosis not present

## 2017-12-14 DIAGNOSIS — E785 Hyperlipidemia, unspecified: Secondary | ICD-10-CM | POA: Diagnosis not present

## 2017-12-21 DIAGNOSIS — E1149 Type 2 diabetes mellitus with other diabetic neurological complication: Secondary | ICD-10-CM | POA: Diagnosis not present

## 2017-12-21 DIAGNOSIS — E1169 Type 2 diabetes mellitus with other specified complication: Secondary | ICD-10-CM | POA: Diagnosis not present

## 2017-12-21 DIAGNOSIS — E782 Mixed hyperlipidemia: Secondary | ICD-10-CM | POA: Diagnosis not present

## 2017-12-21 DIAGNOSIS — E1165 Type 2 diabetes mellitus with hyperglycemia: Secondary | ICD-10-CM | POA: Diagnosis not present

## 2017-12-23 ENCOUNTER — Encounter (HOSPITAL_COMMUNITY): Payer: Self-pay | Admitting: Internal Medicine

## 2017-12-23 ENCOUNTER — Other Ambulatory Visit: Payer: Self-pay

## 2017-12-23 ENCOUNTER — Ambulatory Visit (HOSPITAL_COMMUNITY)
Admission: RE | Admit: 2017-12-23 | Discharge: 2017-12-23 | Disposition: A | Discharge status: Home / Self Care | Payer: Medicare Other | Source: Ambulatory Visit | Attending: Family Medicine | Admitting: Family Medicine

## 2017-12-23 ENCOUNTER — Ambulatory Visit (HOSPITAL_BASED_OUTPATIENT_CLINIC_OR_DEPARTMENT_OTHER)
Admission: RE | Admit: 2017-12-23 | Discharge: 2017-12-23 | Disposition: A | Discharge status: Home / Self Care | Payer: Medicare Other | Source: Ambulatory Visit | Attending: Internal Medicine | Admitting: Internal Medicine

## 2017-12-23 VITALS — BP 148/74 | HR 66 | Wt 176.2 lb

## 2017-12-23 DIAGNOSIS — I110 Hypertensive heart disease with heart failure: Secondary | ICD-10-CM | POA: Insufficient documentation

## 2017-12-23 DIAGNOSIS — R0989 Other specified symptoms and signs involving the circulatory and respiratory systems: Secondary | ICD-10-CM | POA: Diagnosis not present

## 2017-12-23 DIAGNOSIS — I480 Paroxysmal atrial fibrillation: Secondary | ICD-10-CM | POA: Diagnosis not present

## 2017-12-23 DIAGNOSIS — I5022 Chronic systolic (congestive) heart failure: Secondary | ICD-10-CM

## 2017-12-23 DIAGNOSIS — I350 Nonrheumatic aortic (valve) stenosis: Secondary | ICD-10-CM | POA: Diagnosis not present

## 2017-12-23 DIAGNOSIS — I2510 Atherosclerotic heart disease of native coronary artery without angina pectoris: Secondary | ICD-10-CM | POA: Insufficient documentation

## 2017-12-23 LAB — COMPREHENSIVE METABOLIC PANEL
ALT: 18 U/L (ref 17–63)
ANION GAP: 7 (ref 5–15)
AST: 19 U/L (ref 15–41)
Albumin: 4 g/dL (ref 3.5–5.0)
Alkaline Phosphatase: 70 U/L (ref 38–126)
BUN: 14 mg/dL (ref 6–20)
CO2: 28 mmol/L (ref 22–32)
Calcium: 8.9 mg/dL (ref 8.9–10.3)
Chloride: 100 mmol/L — ABNORMAL LOW (ref 101–111)
Creatinine, Ser: 1.23 mg/dL (ref 0.61–1.24)
GFR calc Af Amer: >60 mL/min (ref >60)
GFR calc non Af Amer: 52 mL/min — ABNORMAL LOW (ref >60)
Glucose, Bld: 170 mg/dL — ABNORMAL HIGH (ref 65–99)
POTASSIUM: 3.4 mmol/L — AB (ref 3.5–5.1)
SODIUM: 135 mmol/L (ref 135–145)
Total Bilirubin: 0.6 mg/dL (ref 0.3–1.2)
Total Protein: 6.8 g/dL (ref 6.5–8.1)

## 2017-12-23 LAB — TSH: TSH: 1.626 u[IU]/mL (ref 0.350–4.500)

## 2017-12-23 LAB — MAGNESIUM: Magnesium: 1.8 mg/dL (ref 1.7–2.4)

## 2017-12-23 MED ORDER — LOSARTAN POTASSIUM 25 MG PO TABS: 12.5000 mg | ORAL_TABLET | Freq: Every day | ORAL | 3 refills | Status: DC

## 2017-12-23 NOTE — Progress Notes (Signed)
  Echocardiogram 2D Echocardiogram has been performed.  Bernard Kim F 12/23/2017, 2:32 PM

## 2017-12-23 NOTE — Patient Instructions (Signed)
Restart Losartan 12.5 mg (1/2 tab) daily  Labs today  Your physician has requested that you have an abdominal aorta duplex. During this test, an ultrasound is used to evaluate the aorta. Allow 30 minutes for this exam. Do not eat after midnight the day before and avoid carbonated beverages  We will contact you in 6 months to schedule your next appointment and echocardiogram

## 2017-12-23 NOTE — Progress Notes (Signed)
ADVANCED HF CLINIC NOTE  Primary Cardiologist: Bettina Gavia  HPI:  Bernard Kim is a delightful 84 year old male with past medical history of coronary artery disease status post remote PCI, history of stroke, diabetes mellitus type 2, hypertension, bladder cancer, CKD stage III and ischemic CM with previous EF 40-45%  Admitted in 12/18 with new onset systolic HF and shock liver/kidney with EF ~25% in setting of new onset (unsure duration) AF.Felt to have tachy-induced CM with low output HF. Started on IV amio and Eliquis and eventually converted to NSR. Diuresed with IV lasix.  Found to have small incidental R-sided PE. Also underwent R/L cath as below with distal 85% RCA lesion that was treated medically. Seen by PT and improved steadily. D/c weight 169 pounds  Here for routine follow up. Overall doing very well. Last visit spiro was stopped with concerns for drug rash. Rash resolved shortly afterward. On Monday, his PCP increased his lasix to 40 daily to 60 mg daily alternating with 40 mg daily for weight gain and elevated reds vest reading of 38%. Echo today with improved EF 40-45% and moderate AS. Main complaint is upper back spasms that occur at rest. Relieved by repositioning. He is SOB with stairs. But otherwise asymptomatic. Able to walk around grocery store with no breaks. No CP or palpitations. No orthopnea/PND/edema. He has dizziness with quick movements, but not sitting to standing. Weighs most days: 171-172 lbs. He is working on cutting back salt intake. Limits fluid intake. Compliant with meds, but stopped taking losartan because he thought he was supposed to.   Studies:  12/18 Echo EF 25-30% Mild AS 4/19 Echo EF 40-45%, moderate AS Personally reviewed   12/18 LHC/RHC   Prox RCA lesion is 40% stenosed.  Mid RCA lesion is 30% stenosed.  Dist RCA lesion is 60% stenosed.  Post Atrio lesion is 85% stenosed.  Mid LM to Dist LM lesion is 40% stenosed.  Prox LAD lesion is 60%  stenosed.  Ost 1st Diag to 1st Diag lesion is 30% stenosed.  Prox Cx to Mid Cx lesion is 20% stenosed.  Ao = 121/58 (82)  LV = 146/29 RA = 12 RV = 56/14 PA = 57/21 (33) PCW = 31 (v=45) Fick cardiac output/index = 4.2/2.2 PVR = < 1.0 WU SVR = 1325 Ao sat = 99% PA sat = 66%, 67%  AoV peak-to-peak gradient = 44mmHg  ROS: All systems negative except as listed in HPI, PMH and Problem List.  SH:  Social History   Socioeconomic History  . Marital status: Married    Spouse name: Not on file  . Number of children: Not on file  . Years of education: Not on file  . Highest education level: Not on file  Occupational History  . Not on file  Social Needs  . Financial resource strain: Not on file  . Food insecurity:    Worry: Not on file    Inability: Not on file  . Transportation needs:    Medical: Not on file    Non-medical: Not on file  Tobacco Use  . Smoking status: Never Smoker  . Smokeless tobacco: Never Used  Substance and Sexual Activity  . Alcohol use: No    Frequency: Never  . Drug use: No  . Sexual activity: Not on file  Lifestyle  . Physical activity:    Days per week: Not on file    Minutes per session: Not on file  . Stress: Not on file  Relationships  .  Social connections:    Talks on phone: Not on file    Gets together: Not on file    Attends religious service: Not on file    Active member of club or organization: Not on file    Attends meetings of clubs or organizations: Not on file    Relationship status: Not on file  . Intimate partner violence:    Fear of current or ex partner: Not on file    Emotionally abused: Not on file    Physically abused: Not on file    Forced sexual activity: Not on file  Other Topics Concern  . Not on file  Social History Narrative  . Not on file    FH: No family history on file.  Past Medical History:  Diagnosis Date  . Cancer ([place])    bladdre cancer  . CHF (congestive heart failure) ([place])   . Coronary  artery disease   . Dyspnea   . Hypertension   . Stroke [facility])     Current Outpatient Medications  Medication Sig Dispense Refill  . acetaminophen (TYLENOL 8 HOUR ARTHRITIS PAIN) 650 MG CR tablet Take 650 mg by mouth every 8 (eight) hours as needed for pain.    Marland Kitchen amiodarone (PACERONE) 200 MG tablet Take 1 tablet (200 mg total) by mouth daily. 60 tablet 3  . apixaban (ELIQUIS) 2.5 MG TABS tablet Take 1 tablet (2.5 mg total) by mouth 2 (two) times daily. 60 tablet 3  . docusate sodium (COLACE) 100 MG capsule Take 100 mg by mouth.    . furosemide (LASIX) 40 MG tablet Take 1 tablet every other day alternating with 1.5 tablets every other day     . Insulin Glargine (LANTUS) 100 UNIT/ML Solostar Pen Inject 10 Units into the skin daily at 10 pm.    . lovastatin (MEVACOR) 40 MG tablet Take 40 mg by mouth at bedtime.      No current facility-administered medications for this encounter.     Vitals:   12/23/17 1412  BP: (!) 148/74  Pulse: 66  SpO2: 100%  Weight: 176 lb 4 oz (79.9 kg)   Filed Weights   12/23/17 1412  Weight: 176 lb 4 oz (79.9 kg)     PHYSICAL EXAM:  General: Well appearing. Elderly. No resp difficulty. HEENT: Normal Anicteric Neck: Supple. JVP 5-6. Carotids 2+ bilat; + bilateral bruits. No thyromegaly or nodule noted. Cor: PMI nondisplaced. RRR, 3/6 AS with significantly reduced S2 Lungs: CTAB, normal effort. No wheeze Abdomen: Soft, non-tender, non-distended, no HSM. Faint abdominal bruit. +BS  Extremities: No cyanosis, clubbing, or rash. R and LLE no edema. Left ankle brace Neuro: alert & oriented x 3, cranial nerves grossly intact. moves all 4 extremities w/o difficulty. Affect pleasant  ECG: NSR with PACs, 63 bpms  RedsVest: 31%   ASSESSMENT & PLAN:  1. Acute systolic HF - EF 22/02 54-27% in setting on new onset AF with RVR. Likely tachy-induced (previous EF 40-45% due to iCM) - Stable NYHA II now back in NSR - Bedside echo 2/19 with EF 35-40% (improving).  Echo today: EF 40-45%, moderate AS - Volume status looks good. Reds Vest read 31%. He started alternating 40/60 lasix daily on Monday.  - Restart losartan to 12.5 daily (BP dropped with 25mg  daily) - Arlyce Harman DC'd last visit for concern for drug rash, which resolved shortly after - No beta blocker yet.   2. Paroxysmal Atrial fibrillation:  - Maintaining NSR - Continue amio 200 daily. Check TSH,  LFTs today - Continue Eliquis to 2.5 bid (age and SCr ~1.5).  3. PE: small, R -sided segmental PE seen on CTA chest.   - Likely incidental finding - On Eliquis. No change  4. CAD - s/p previous RCA stent. Cath from 09/03/17 with mostly non-obstructive CAD but 80-90% lesion in dRCA - No s/s ischemia  Doubt lesion is related to LV dysfunction - continue medical treatment with statin. Off ASA with Eliquis  - if symptomatic can consider PCI once LV function recovers from tachy CM  5. CKD stable III - baseline creatinine ~1.5.  - Stable 10/2017 1.49   6. T2DM: well controlled. Last A1c 8 09/01/2017.  - Per primary  - Consider Jardiance  7. Aortic stenosis - In moderate range. Will need to follow closely for possible TAVR.  - Echo today: moderate with peak gradient 32  8. Abdominal bruit - check u/s  9. [employer] basketball fan - continues to be an intractable problem for him   Restart losartan 12.5 mg daily BMET, Mag, TSH, LFTs Echo 6 months Abdominal US to r/o AAA  Georgiana Shore, NP 12/23/17  Patient seen and examined with the above-signed Advanced Practice Provider and/or Housestaff. I personally reviewed laboratory data, imaging studies and relevant notes. I independently examined the patient and formulated the important aspects of the plan. I have edited the note to reflect any of my changes or salient points. I have personally discussed the plan with the patient and/or family.  He is much improved with maintenance of NSR. I reviewed echo today with him and his wife and EF back to  40-45% which is his baseline. He also has a heavily calcified AoV with moderate AS (mean gradient 32). I explained that he will likely need TAVR in the next few years. We will repeat echo in 6 months to assess rate of progression. Also has ab bruit. Possible related to AS but will need ab u/s to evaluate for AAA. Check labs today. Start low-dose losartan. Lasix dose recently increased due to elevated REDS reading. Reading much improved today. Will follow closely make sure not to overdiurese as he is preload dependent with AS.   Glori Bickers, MD  10:32 AM

## 2017-12-25 ENCOUNTER — Telehealth (HOSPITAL_COMMUNITY): Payer: Self-pay | Admitting: *Deleted

## 2017-12-25 MED ORDER — POTASSIUM CHLORIDE CRYS ER 20 MEQ PO TBCR: 20.0000 meq | EXTENDED_RELEASE_TABLET | Freq: Every day | ORAL | 3 refills | Status: DC

## 2017-12-25 NOTE — Telephone Encounter (Signed)
Result Notes for TSH   Notes recorded by Darron Doom, RN on 12/25/2017 at 10:05 AM EDT Patient called back and he is agreeable with plan. Will take 40 Kcl today and then 20 mEq daily. Medication sent to pharmacy. ------  Notes recorded by Darron Doom, RN on 12/25/2017 at 8:28 AM EDT Patient called back late yesterday afternoon and left message on triage line. Tried calling him back again this morning but had to leave message. Will continue to try and reach patient. ------  Notes recorded by Scarlette Calico, RN on 12/24/2017 at 12:34 PM EDT Left message to call back ------  Notes recorded by Jolaine Artist, MD on 12/23/2017 at 10:02 PM EDT Hake him take kcl 20 extra per day. Give 40kcl the first day.

## 2018-01-07 ENCOUNTER — Ambulatory Visit (HOSPITAL_COMMUNITY)
Admission: RE | Admit: 2018-01-07 | Discharge: 2018-01-07 | Disposition: A | Discharge status: Home / Self Care | Payer: Medicare Other | Source: Ambulatory Visit | Attending: Cardiology | Admitting: Cardiology

## 2018-01-07 DIAGNOSIS — E119 Type 2 diabetes mellitus without complications: Secondary | ICD-10-CM | POA: Insufficient documentation

## 2018-01-07 DIAGNOSIS — Z794 Long term (current) use of insulin: Secondary | ICD-10-CM | POA: Insufficient documentation

## 2018-01-07 DIAGNOSIS — R0989 Other specified symptoms and signs involving the circulatory and respiratory systems: Secondary | ICD-10-CM | POA: Diagnosis not present

## 2018-01-07 DIAGNOSIS — I2510 Atherosclerotic heart disease of native coronary artery without angina pectoris: Secondary | ICD-10-CM | POA: Diagnosis not present

## 2018-01-07 DIAGNOSIS — E785 Hyperlipidemia, unspecified: Secondary | ICD-10-CM | POA: Insufficient documentation

## 2018-01-07 DIAGNOSIS — I10 Essential (primary) hypertension: Secondary | ICD-10-CM | POA: Insufficient documentation

## 2018-01-07 DIAGNOSIS — E669 Obesity, unspecified: Secondary | ICD-10-CM | POA: Diagnosis not present

## 2018-01-11 DIAGNOSIS — N529 Male erectile dysfunction, unspecified: Secondary | ICD-10-CM | POA: Diagnosis not present

## 2018-01-11 DIAGNOSIS — C672 Malignant neoplasm of lateral wall of bladder: Secondary | ICD-10-CM | POA: Diagnosis not present

## 2018-01-11 DIAGNOSIS — C61 Malignant neoplasm of prostate: Secondary | ICD-10-CM | POA: Diagnosis not present

## 2018-01-26 DIAGNOSIS — Z794 Long term (current) use of insulin: Secondary | ICD-10-CM | POA: Diagnosis not present

## 2018-01-26 DIAGNOSIS — I350 Nonrheumatic aortic (valve) stenosis: Secondary | ICD-10-CM | POA: Diagnosis not present

## 2018-01-26 DIAGNOSIS — I2510 Atherosclerotic heart disease of native coronary artery without angina pectoris: Secondary | ICD-10-CM | POA: Diagnosis not present

## 2018-01-26 DIAGNOSIS — I130 Hypertensive heart and chronic kidney disease with heart failure and stage 1 through stage 4 chronic kidney disease, or unspecified chronic kidney disease: Secondary | ICD-10-CM | POA: Diagnosis not present

## 2018-01-26 DIAGNOSIS — H9193 Unspecified hearing loss, bilateral: Secondary | ICD-10-CM | POA: Diagnosis not present

## 2018-01-26 DIAGNOSIS — C672 Malignant neoplasm of lateral wall of bladder: Secondary | ICD-10-CM | POA: Diagnosis not present

## 2018-01-26 DIAGNOSIS — Z7901 Long term (current) use of anticoagulants: Secondary | ICD-10-CM | POA: Diagnosis not present

## 2018-01-26 DIAGNOSIS — Z86711 Personal history of pulmonary embolism: Secondary | ICD-10-CM | POA: Diagnosis not present

## 2018-01-26 DIAGNOSIS — E1122 Type 2 diabetes mellitus with diabetic chronic kidney disease: Secondary | ICD-10-CM | POA: Diagnosis not present

## 2018-01-26 DIAGNOSIS — N183 Chronic kidney disease, stage 3 (moderate): Secondary | ICD-10-CM | POA: Diagnosis not present

## 2018-01-26 DIAGNOSIS — I509 Heart failure, unspecified: Secondary | ICD-10-CM | POA: Diagnosis not present

## 2018-01-26 DIAGNOSIS — E785 Hyperlipidemia, unspecified: Secondary | ICD-10-CM | POA: Diagnosis not present

## 2018-01-26 DIAGNOSIS — I480 Paroxysmal atrial fibrillation: Secondary | ICD-10-CM | POA: Diagnosis not present

## 2018-01-26 DIAGNOSIS — Z955 Presence of coronary angioplasty implant and graft: Secondary | ICD-10-CM | POA: Diagnosis not present

## 2018-01-27 ENCOUNTER — Telehealth (HOSPITAL_COMMUNITY): Payer: Self-pay | Admitting: *Deleted

## 2018-01-27 DIAGNOSIS — C672 Malignant neoplasm of lateral wall of bladder: Secondary | ICD-10-CM | POA: Diagnosis not present

## 2018-01-27 DIAGNOSIS — Z01810 Encounter for preprocedural cardiovascular examination: Secondary | ICD-10-CM | POA: Diagnosis not present

## 2018-01-27 NOTE — Telephone Encounter (Signed)
Advanced Heart Failure Triage Encounter  Patient Name: Bernard Kim  Date of Call: 01/27/18  Problem:  Patient is scheduled for bladder surgery on May 28th with Dr. Rosana Hoes at [facility] at preop he was advised to be seen by Dr. Haroldine Laws before they could clear him from Anesthesia.  Patient stated on May 5th he had an episode of chest pain with BP 230/100.  911 was called and he was advised to take 4-81 mg aspirin and he had already taken nitro.  By the time EMS arrived chest pain subsided and bp was back to normal.  Hasn't had any problems since.  Patient is now being instructed to be seen before surgery.   Plan:  Will discuss with Kevan Rosebush, RN and see if we can schedule patient before his surgery.   Darron Doom, RN

## 2018-02-01 NOTE — Telephone Encounter (Signed)
Patient does have CAD on recent cath but has been stable. Also on coumadin - would need to hold. Is it open surgery or scope?

## 2018-02-02 NOTE — Telephone Encounter (Signed)
I agree with that plan. 

## 2018-02-02 NOTE — Telephone Encounter (Signed)
Patient says they're just scoping him, he's on eliquis and was originally instructed to hold it 3 days prior by Dr. Rosana Hoes.  Will send to Dr. Haroldine Laws to make sure this is ok with him and will call patient back.

## 2018-02-03 NOTE — Telephone Encounter (Signed)
Called and spoke with patient's wife and she is aware that Dr. Haroldine Laws agree's with Dr. Rosana Hoes.  Patient is cleared to proceed with surgery and aware to hold Eliquis 3 days prior.  No further questions.

## 2018-02-04 DIAGNOSIS — C672 Malignant neoplasm of lateral wall of bladder: Secondary | ICD-10-CM | POA: Diagnosis not present

## 2018-02-04 DIAGNOSIS — I350 Nonrheumatic aortic (valve) stenosis: Secondary | ICD-10-CM | POA: Diagnosis not present

## 2018-02-04 DIAGNOSIS — Z01810 Encounter for preprocedural cardiovascular examination: Secondary | ICD-10-CM | POA: Diagnosis not present

## 2018-02-04 DIAGNOSIS — C61 Malignant neoplasm of prostate: Secondary | ICD-10-CM | POA: Diagnosis not present

## 2018-02-23 DIAGNOSIS — C672 Malignant neoplasm of lateral wall of bladder: Secondary | ICD-10-CM | POA: Diagnosis not present

## 2018-02-23 DIAGNOSIS — I2510 Atherosclerotic heart disease of native coronary artery without angina pectoris: Secondary | ICD-10-CM | POA: Diagnosis not present

## 2018-02-23 DIAGNOSIS — I480 Paroxysmal atrial fibrillation: Secondary | ICD-10-CM | POA: Diagnosis not present

## 2018-02-23 DIAGNOSIS — E1122 Type 2 diabetes mellitus with diabetic chronic kidney disease: Secondary | ICD-10-CM | POA: Diagnosis not present

## 2018-02-23 DIAGNOSIS — Z8551 Personal history of malignant neoplasm of bladder: Secondary | ICD-10-CM | POA: Diagnosis not present

## 2018-02-23 DIAGNOSIS — C679 Malignant neoplasm of bladder, unspecified: Secondary | ICD-10-CM | POA: Diagnosis not present

## 2018-02-23 DIAGNOSIS — Z955 Presence of coronary angioplasty implant and graft: Secondary | ICD-10-CM | POA: Diagnosis not present

## 2018-02-23 DIAGNOSIS — Z7901 Long term (current) use of anticoagulants: Secondary | ICD-10-CM | POA: Diagnosis not present

## 2018-02-23 DIAGNOSIS — I130 Hypertensive heart and chronic kidney disease with heart failure and stage 1 through stage 4 chronic kidney disease, or unspecified chronic kidney disease: Secondary | ICD-10-CM | POA: Diagnosis not present

## 2018-02-23 DIAGNOSIS — Z86711 Personal history of pulmonary embolism: Secondary | ICD-10-CM | POA: Diagnosis not present

## 2018-02-23 DIAGNOSIS — H9193 Unspecified hearing loss, bilateral: Secondary | ICD-10-CM | POA: Diagnosis not present

## 2018-02-23 DIAGNOSIS — Z794 Long term (current) use of insulin: Secondary | ICD-10-CM | POA: Diagnosis not present

## 2018-02-23 DIAGNOSIS — I350 Nonrheumatic aortic (valve) stenosis: Secondary | ICD-10-CM | POA: Diagnosis not present

## 2018-02-23 DIAGNOSIS — N183 Chronic kidney disease, stage 3 (moderate): Secondary | ICD-10-CM | POA: Diagnosis not present

## 2018-02-23 DIAGNOSIS — E785 Hyperlipidemia, unspecified: Secondary | ICD-10-CM | POA: Diagnosis not present

## 2018-02-23 DIAGNOSIS — I509 Heart failure, unspecified: Secondary | ICD-10-CM | POA: Diagnosis not present

## 2018-02-24 DIAGNOSIS — Z794 Long term (current) use of insulin: Secondary | ICD-10-CM | POA: Diagnosis not present

## 2018-02-24 DIAGNOSIS — E113292 Type 2 diabetes mellitus with mild nonproliferative diabetic retinopathy without macular edema, left eye: Secondary | ICD-10-CM | POA: Diagnosis not present

## 2018-02-24 DIAGNOSIS — Z7984 Long term (current) use of oral hypoglycemic drugs: Secondary | ICD-10-CM | POA: Diagnosis not present

## 2018-02-24 DIAGNOSIS — H40003 Preglaucoma, unspecified, bilateral: Secondary | ICD-10-CM | POA: Diagnosis not present

## 2018-03-09 DIAGNOSIS — R82998 Other abnormal findings in urine: Secondary | ICD-10-CM | POA: Diagnosis not present

## 2018-03-09 DIAGNOSIS — C672 Malignant neoplasm of lateral wall of bladder: Secondary | ICD-10-CM | POA: Diagnosis not present

## 2018-03-14 DIAGNOSIS — E1149 Type 2 diabetes mellitus with other diabetic neurological complication: Secondary | ICD-10-CM | POA: Diagnosis not present

## 2018-03-14 DIAGNOSIS — I5022 Chronic systolic (congestive) heart failure: Secondary | ICD-10-CM | POA: Diagnosis not present

## 2018-03-14 DIAGNOSIS — E1169 Type 2 diabetes mellitus with other specified complication: Secondary | ICD-10-CM | POA: Diagnosis not present

## 2018-03-14 DIAGNOSIS — E1165 Type 2 diabetes mellitus with hyperglycemia: Secondary | ICD-10-CM | POA: Diagnosis not present

## 2018-03-19 DIAGNOSIS — C672 Malignant neoplasm of lateral wall of bladder: Secondary | ICD-10-CM | POA: Diagnosis not present

## 2018-03-22 DIAGNOSIS — E1149 Type 2 diabetes mellitus with other diabetic neurological complication: Secondary | ICD-10-CM | POA: Diagnosis not present

## 2018-03-22 DIAGNOSIS — I129 Hypertensive chronic kidney disease with stage 1 through stage 4 chronic kidney disease, or unspecified chronic kidney disease: Secondary | ICD-10-CM | POA: Diagnosis not present

## 2018-03-22 DIAGNOSIS — E1169 Type 2 diabetes mellitus with other specified complication: Secondary | ICD-10-CM | POA: Diagnosis not present

## 2018-03-25 DIAGNOSIS — C672 Malignant neoplasm of lateral wall of bladder: Secondary | ICD-10-CM | POA: Diagnosis not present

## 2018-03-29 DIAGNOSIS — I129 Hypertensive chronic kidney disease with stage 1 through stage 4 chronic kidney disease, or unspecified chronic kidney disease: Secondary | ICD-10-CM | POA: Diagnosis not present

## 2018-03-29 DIAGNOSIS — E1165 Type 2 diabetes mellitus with hyperglycemia: Secondary | ICD-10-CM | POA: Diagnosis not present

## 2018-03-29 DIAGNOSIS — E785 Hyperlipidemia, unspecified: Secondary | ICD-10-CM | POA: Diagnosis not present

## 2018-03-29 DIAGNOSIS — E1149 Type 2 diabetes mellitus with other diabetic neurological complication: Secondary | ICD-10-CM | POA: Diagnosis not present

## 2018-03-30 DIAGNOSIS — C672 Malignant neoplasm of lateral wall of bladder: Secondary | ICD-10-CM | POA: Diagnosis not present

## 2018-04-06 DIAGNOSIS — C672 Malignant neoplasm of lateral wall of bladder: Secondary | ICD-10-CM | POA: Diagnosis not present

## 2018-04-14 ENCOUNTER — Other Ambulatory Visit (HOSPITAL_COMMUNITY): Payer: Self-pay | Admitting: Internal Medicine

## 2018-04-14 DIAGNOSIS — E1149 Type 2 diabetes mellitus with other diabetic neurological complication: Secondary | ICD-10-CM | POA: Diagnosis not present

## 2018-04-14 DIAGNOSIS — Z9181 History of falling: Secondary | ICD-10-CM | POA: Diagnosis not present

## 2018-04-14 DIAGNOSIS — E785 Hyperlipidemia, unspecified: Secondary | ICD-10-CM | POA: Diagnosis not present

## 2018-04-14 DIAGNOSIS — E1165 Type 2 diabetes mellitus with hyperglycemia: Secondary | ICD-10-CM | POA: Diagnosis not present

## 2018-04-16 DIAGNOSIS — C672 Malignant neoplasm of lateral wall of bladder: Secondary | ICD-10-CM | POA: Diagnosis not present

## 2018-04-27 ENCOUNTER — Other Ambulatory Visit (HOSPITAL_COMMUNITY): Payer: Self-pay | Admitting: *Deleted

## 2018-04-27 MED ORDER — LOSARTAN POTASSIUM 25 MG PO TABS: 12.5000 mg | ORAL_TABLET | Freq: Every day | ORAL | 3 refills | Status: DC

## 2018-05-14 DIAGNOSIS — E1149 Type 2 diabetes mellitus with other diabetic neurological complication: Secondary | ICD-10-CM | POA: Diagnosis not present

## 2018-05-14 DIAGNOSIS — E1165 Type 2 diabetes mellitus with hyperglycemia: Secondary | ICD-10-CM | POA: Diagnosis not present

## 2018-05-14 DIAGNOSIS — E785 Hyperlipidemia, unspecified: Secondary | ICD-10-CM | POA: Diagnosis not present

## 2018-05-14 DIAGNOSIS — I129 Hypertensive chronic kidney disease with stage 1 through stage 4 chronic kidney disease, or unspecified chronic kidney disease: Secondary | ICD-10-CM | POA: Diagnosis not present

## 2018-05-24 DIAGNOSIS — C672 Malignant neoplasm of lateral wall of bladder: Secondary | ICD-10-CM | POA: Diagnosis not present

## 2018-05-24 DIAGNOSIS — Z8546 Personal history of malignant neoplasm of prostate: Secondary | ICD-10-CM | POA: Diagnosis not present

## 2018-06-14 DIAGNOSIS — E1149 Type 2 diabetes mellitus with other diabetic neurological complication: Secondary | ICD-10-CM | POA: Diagnosis not present

## 2018-06-14 DIAGNOSIS — I129 Hypertensive chronic kidney disease with stage 1 through stage 4 chronic kidney disease, or unspecified chronic kidney disease: Secondary | ICD-10-CM | POA: Diagnosis not present

## 2018-06-14 DIAGNOSIS — E785 Hyperlipidemia, unspecified: Secondary | ICD-10-CM | POA: Diagnosis not present

## 2018-06-14 DIAGNOSIS — E1165 Type 2 diabetes mellitus with hyperglycemia: Secondary | ICD-10-CM | POA: Diagnosis not present

## 2018-06-22 DIAGNOSIS — I129 Hypertensive chronic kidney disease with stage 1 through stage 4 chronic kidney disease, or unspecified chronic kidney disease: Secondary | ICD-10-CM | POA: Diagnosis not present

## 2018-06-22 DIAGNOSIS — E1149 Type 2 diabetes mellitus with other diabetic neurological complication: Secondary | ICD-10-CM | POA: Diagnosis not present

## 2018-06-22 DIAGNOSIS — E1169 Type 2 diabetes mellitus with other specified complication: Secondary | ICD-10-CM | POA: Diagnosis not present

## 2018-07-02 DIAGNOSIS — E1169 Type 2 diabetes mellitus with other specified complication: Secondary | ICD-10-CM | POA: Diagnosis not present

## 2018-07-02 DIAGNOSIS — I129 Hypertensive chronic kidney disease with stage 1 through stage 4 chronic kidney disease, or unspecified chronic kidney disease: Secondary | ICD-10-CM | POA: Diagnosis not present

## 2018-07-02 DIAGNOSIS — N182 Chronic kidney disease, stage 2 (mild): Secondary | ICD-10-CM | POA: Diagnosis not present

## 2018-07-02 DIAGNOSIS — E782 Mixed hyperlipidemia: Secondary | ICD-10-CM | POA: Diagnosis not present

## 2018-07-15 DIAGNOSIS — N182 Chronic kidney disease, stage 2 (mild): Secondary | ICD-10-CM | POA: Diagnosis not present

## 2018-07-15 DIAGNOSIS — I129 Hypertensive chronic kidney disease with stage 1 through stage 4 chronic kidney disease, or unspecified chronic kidney disease: Secondary | ICD-10-CM | POA: Diagnosis not present

## 2018-07-15 DIAGNOSIS — E782 Mixed hyperlipidemia: Secondary | ICD-10-CM | POA: Diagnosis not present

## 2018-07-15 DIAGNOSIS — E1169 Type 2 diabetes mellitus with other specified complication: Secondary | ICD-10-CM | POA: Diagnosis not present

## 2018-07-26 ENCOUNTER — Other Ambulatory Visit (HOSPITAL_COMMUNITY): Payer: Self-pay | Admitting: Internal Medicine

## 2018-07-26 DIAGNOSIS — E1149 Type 2 diabetes mellitus with other diabetic neurological complication: Secondary | ICD-10-CM | POA: Diagnosis not present

## 2018-07-26 DIAGNOSIS — E1165 Type 2 diabetes mellitus with hyperglycemia: Secondary | ICD-10-CM | POA: Diagnosis not present

## 2018-07-26 DIAGNOSIS — I129 Hypertensive chronic kidney disease with stage 1 through stage 4 chronic kidney disease, or unspecified chronic kidney disease: Secondary | ICD-10-CM | POA: Diagnosis not present

## 2018-08-14 DIAGNOSIS — E782 Mixed hyperlipidemia: Secondary | ICD-10-CM | POA: Diagnosis not present

## 2018-08-14 DIAGNOSIS — E1149 Type 2 diabetes mellitus with other diabetic neurological complication: Secondary | ICD-10-CM | POA: Diagnosis not present

## 2018-08-14 DIAGNOSIS — E1165 Type 2 diabetes mellitus with hyperglycemia: Secondary | ICD-10-CM | POA: Diagnosis not present

## 2018-08-14 DIAGNOSIS — I129 Hypertensive chronic kidney disease with stage 1 through stage 4 chronic kidney disease, or unspecified chronic kidney disease: Secondary | ICD-10-CM | POA: Diagnosis not present

## 2018-08-23 DIAGNOSIS — E1149 Type 2 diabetes mellitus with other diabetic neurological complication: Secondary | ICD-10-CM | POA: Diagnosis not present

## 2018-08-23 DIAGNOSIS — E1165 Type 2 diabetes mellitus with hyperglycemia: Secondary | ICD-10-CM | POA: Diagnosis not present

## 2018-08-27 DIAGNOSIS — Z8551 Personal history of malignant neoplasm of bladder: Secondary | ICD-10-CM | POA: Diagnosis not present

## 2018-08-27 DIAGNOSIS — Z08 Encounter for follow-up examination after completed treatment for malignant neoplasm: Secondary | ICD-10-CM | POA: Diagnosis not present

## 2018-08-27 DIAGNOSIS — Z9079 Acquired absence of other genital organ(s): Secondary | ICD-10-CM | POA: Diagnosis not present

## 2018-08-27 DIAGNOSIS — Z8546 Personal history of malignant neoplasm of prostate: Secondary | ICD-10-CM | POA: Diagnosis not present

## 2018-09-14 DIAGNOSIS — I129 Hypertensive chronic kidney disease with stage 1 through stage 4 chronic kidney disease, or unspecified chronic kidney disease: Secondary | ICD-10-CM | POA: Diagnosis not present

## 2018-09-14 DIAGNOSIS — E1165 Type 2 diabetes mellitus with hyperglycemia: Secondary | ICD-10-CM | POA: Diagnosis not present

## 2018-09-14 DIAGNOSIS — E1149 Type 2 diabetes mellitus with other diabetic neurological complication: Secondary | ICD-10-CM | POA: Diagnosis not present

## 2018-09-14 DIAGNOSIS — E782 Mixed hyperlipidemia: Secondary | ICD-10-CM | POA: Diagnosis not present

## 2018-09-30 DIAGNOSIS — E1149 Type 2 diabetes mellitus with other diabetic neurological complication: Secondary | ICD-10-CM | POA: Diagnosis not present

## 2018-09-30 DIAGNOSIS — E1169 Type 2 diabetes mellitus with other specified complication: Secondary | ICD-10-CM | POA: Diagnosis not present

## 2018-09-30 DIAGNOSIS — I129 Hypertensive chronic kidney disease with stage 1 through stage 4 chronic kidney disease, or unspecified chronic kidney disease: Secondary | ICD-10-CM | POA: Diagnosis not present

## 2018-10-07 DIAGNOSIS — E1165 Type 2 diabetes mellitus with hyperglycemia: Secondary | ICD-10-CM | POA: Diagnosis not present

## 2018-10-07 DIAGNOSIS — E1169 Type 2 diabetes mellitus with other specified complication: Secondary | ICD-10-CM | POA: Diagnosis not present

## 2018-10-07 DIAGNOSIS — E782 Mixed hyperlipidemia: Secondary | ICD-10-CM | POA: Diagnosis not present

## 2018-10-07 DIAGNOSIS — E1149 Type 2 diabetes mellitus with other diabetic neurological complication: Secondary | ICD-10-CM | POA: Diagnosis not present

## 2018-10-11 ENCOUNTER — Telehealth: Payer: Self-pay | Admitting: Cardiology

## 2018-10-11 NOTE — Telephone Encounter (Signed)
Spoke with patient who states he has been having increased shortness of breath on exertion. Denies shortness of breath at rest. Patient weighs himself every day and reports that his weights are trending down. No noticeable swelling. His BP was 137/64 and his HR was 66 this morning. Patient is taking all medications as prescribed except for he is only taking furosemide 40 mg daily instead of alternating between 40 mg and 60 mg every other day. Advised patient to start taking furosemide as prescribed. Patient verbalized understanding.   Patient requested to be seen by Dr. Geraldo Pitter in the [facility] office this week since Dr. Bettina Gavia is away. Patient has been scheduled an appointment for tomorrow, 10/12/2018 at 1:20 pm. Patient advised to arrive at 1:00 pm. Patient verbalized understanding. No further questions.   Patient also reports lightheadedness. Patient is diabetic and his PCP started him on Jardiance last week. Patient checked his blood sugar this morning and it was 107 and he has eaten recently. Patient is reluctant about this new medication. Advised him to contact his PCP to discuss. Patient verbalized understanding. No further questions.

## 2018-10-11 NOTE — Telephone Encounter (Signed)
°  patient has called this am  and he is having issues with SOB and he is feeling worse.. he is feeling bad ans eants to be seen sooner if possible.

## 2018-10-12 ENCOUNTER — Encounter: Payer: Self-pay | Admitting: Cardiology

## 2018-10-12 ENCOUNTER — Ambulatory Visit (INDEPENDENT_AMBULATORY_CARE_PROVIDER_SITE_OTHER): Payer: Medicare Other | Admitting: Cardiology

## 2018-10-12 VITALS — BP 142/70 | HR 67 | Ht 68.0 in | Wt 179.0 lb

## 2018-10-12 DIAGNOSIS — I5020 Unspecified systolic (congestive) heart failure: Secondary | ICD-10-CM

## 2018-10-12 DIAGNOSIS — I10 Essential (primary) hypertension: Secondary | ICD-10-CM | POA: Diagnosis not present

## 2018-10-12 DIAGNOSIS — E782 Mixed hyperlipidemia: Secondary | ICD-10-CM

## 2018-10-12 DIAGNOSIS — I480 Paroxysmal atrial fibrillation: Secondary | ICD-10-CM

## 2018-10-12 DIAGNOSIS — I2510 Atherosclerotic heart disease of native coronary artery without angina pectoris: Secondary | ICD-10-CM | POA: Diagnosis not present

## 2018-10-12 DIAGNOSIS — I350 Nonrheumatic aortic (valve) stenosis: Secondary | ICD-10-CM

## 2018-10-12 MED ORDER — TORSEMIDE 10 MG PO TABS: 10.0000 mg | ORAL_TABLET | Freq: Every day | ORAL | 0 refills | Status: DC

## 2018-10-12 NOTE — Patient Instructions (Signed)
Medication Instructions:  Your physician has recommended you make the following change in your medication:   Stop: furosemide   Start: Torsemide 10 mg daily  If you need a refill on your cardiac medications before your next appointment, please call your pharmacy.   Lab work: Your physician recommends that you return for lab work today: bmp, bnp  And repeat bmp in 1 week   If you have labs (blood work) drawn today and your tests are completely normal, you will receive your results only by: Marland Kitchen MyChart Message (if you have MyChart) OR . A paper copy in the mail If you have any lab test that is abnormal or we need to change your treatment, we will call you to review the results.  Testing/Procedures: Your physician has requested that you have an echocardiogram. Echocardiography is a painless test that uses sound waves to create images of your heart. It provides your doctor with information about the size and shape of your heart and how well your heart's chambers and valves are working. This procedure takes approximately one hour. There are no restrictions for this procedure.     Follow-Up: At [facility], you and your health needs are our priority.  As part of our continuing mission to provide you with exceptional heart care, we have created designated Provider Care Teams.  These Care Teams include your primary Cardiologist (physician) and Advanced Practice Providers (APPs -  Physician Assistants and Nurse Practitioners) who all work together to provide you with the care you need, when you need it. You will need a follow up appointment in 1 months.  Please call our office 2 months in advance to schedule this appointment.  You may see No primary care provider on file. or another member of our [employer] Provider Team in [place]: Jenne Campus, MD . Shirlee More, MD  Any Other Special Instructions Will Be Listed Below (If Applicable).  Torsemide tablets What is this  medicine? TORSEMIDE (TORE se mide) is a diuretic. It helps you make more urine and to lose salt and excess water from your body. This medicine is used to treat high blood pressure, and edema or swelling from heart, kidney, or liver disease. This medicine may be used for other purposes; ask your health care provider or pharmacist if you have questions. COMMON BRAND NAME(S): Demadex What should I tell my health care provider before I take this medicine? They need to know if you have any of these conditions: -abnormal blood electrolytes -diabetes -gout -heart disease -kidney disease -liver disease -small amounts of urine, or difficulty passing urine -an unusual or allergic reaction to torsemide, sulfa drugs, other medicines, foods, dyes, or preservatives -pregnant or trying to get pregnant -breast-feeding How should I use this medicine? Take this medicine by mouth with a glass of water. Follow the directions on the prescription label. You may take this medicine with or without food. If it upsets your stomach, take it with food or milk. Do not take your medicine more often than directed. Remember that you will need to pass more urine after taking this medicine. Do not take your medicine at a time of day that will cause you problems. Do not take at bedtime. Talk to your pediatrician regarding the use of this medicine in children. Special care may be needed. Overdosage: If you think you have taken too much of this medicine contact a poison control center or emergency room at once. NOTE: This medicine is only for you. Do not share  this medicine with others. What if I miss a dose? If you miss a dose, take it as soon as you can. If it is almost time for your next dose, take only that dose. Do not take double or extra doses. What may interact with this medicine? -alcohol -certain antibiotics given by injection certain heart medicines like digoxin -diuretics -lithium -medicines for  diabetes -medicines for blood pressure -medicines for cholesterol like cholestyramine -medicines that relax muscles for surgery -NSAIDs, medicines for pain and inflammation, like ibuprofen or naproxen -OTC supplements like ginseng and ephedra -probenecid -steroid medicines like prednisone or cortisone This list may not describe all possible interactions. Give your health care provider a list of all the medicines, herbs, non-prescription drugs, or dietary supplements you use. Also tell them if you smoke, drink alcohol, or use illegal drugs. Some items may interact with your medicine. What should I watch for while using this medicine? Visit your doctor or health care professional for regular checks on your progress. Check your blood pressure regularly. Ask your doctor or health care professional what your blood pressure should be, and when you should contact him or her. If you are a diabetic, check your blood sugar as directed. You may need to be on a special diet while taking this medicine. Check with your doctor. Also, ask how many glasses of fluid you need to drink a day. You must not get dehydrated. You may get drowsy or dizzy. Do not drive, use machinery, or do anything that needs mental alertness until you know how this drug affects you. Do not stand or sit up quickly, especially if you are an older patient. This reduces the risk of dizzy or fainting spells. Alcohol can make you more drowsy and dizzy. Avoid alcoholic drinks. What side effects may I notice from receiving this medicine? Side effects that you should report to your doctor or health care professional as soon as possible: -allergic reactions such as skin rash or itching, hives, swelling of the lips, mouth, tongue or throat -blood in urine or stool -dry mouth -hearing loss or ringing in the ears -irregular heartbeat -muscle pain, weakness or cramps -pain or difficulty passing urine -unusually weak or tired -vomiting or  diarrhea Side effects that usually do not require medical attention (report to your doctor or health care professional if they continue or are bothersome): -dizzy or lightheaded -headache -increased thirst -passing large amounts of urine -sexual difficulties -stomach pain, upset or nausea This list may not describe all possible side effects. Call your doctor for medical advice about side effects. You may report side effects to FDA at 1-800-FDA-1088. Where should I keep my medicine? Keep out of the reach of children. Store at room temperature between 15 and 30 degrees C (59 and 86 degrees F). Throw away any unused medicine after the expiration date. NOTE: This sheet is a summary. It may not cover all possible information. If you have questions about this medicine, talk to your doctor, pharmacist, or health care provider.  2019 Elsevier/Gold Standard (2008-05-18 11:35:45)    Echocardiogram An echocardiogram is a procedure that uses painless sound waves (ultrasound) to produce an image of the heart. Images from an echocardiogram can provide important information about:  Signs of coronary artery disease (CAD).  Aneurysm detection. An aneurysm is a weak or damaged part of an artery wall that bulges out from the normal force of blood pumping through the body.  Heart size and shape. Changes in the size or shape of the  heart can be associated with certain conditions, including heart failure, aneurysm, and CAD.  Heart muscle function.  Heart valve function.  Signs of a past heart attack.  Fluid buildup around the heart.  Thickening of the heart muscle.  A tumor or infectious growth around the heart valves. Tell a health care provider about:  Any allergies you have.  All medicines you are taking, including vitamins, herbs, eye drops, creams, and over-the-counter medicines.  Any blood disorders you have.  Any surgeries you have had.  Any medical conditions you have.  Whether  you are pregnant or may be pregnant. What are the risks? Generally, this is a safe procedure. However, problems may occur, including:  Allergic reaction to dye (contrast) that may be used during the procedure. What happens before the procedure? No specific preparation is needed. You may eat and drink normally. What happens during the procedure?   An IV tube may be inserted into one of your veins.  You may receive contrast through this tube. A contrast is an injection that improves the quality of the pictures from your heart.  A gel will be applied to your chest.  A wand-like tool (transducer) will be moved over your chest. The gel will help to transmit the sound waves from the transducer.  The sound waves will harmlessly bounce off of your heart to allow the heart images to be captured in real-time motion. The images will be recorded on a computer. The procedure may vary among health care providers and hospitals. What happens after the procedure?  You may return to your normal, everyday life, including diet, activities, and medicines, unless your health care provider tells you not to do that. Summary  An echocardiogram is a procedure that uses painless sound waves (ultrasound) to produce an image of the heart.  Images from an echocardiogram can provide important information about the size and shape of your heart, heart muscle function, heart valve function, and fluid buildup around your heart.  You do not need to do anything to prepare before this procedure. You may eat and drink normally.  After the echocardiogram is completed, you may return to your normal, everyday life, unless your health care provider tells you not to do that. This information is not intended to replace advice given to you by your health care provider. Make sure you discuss any questions you have with your health care provider. Document Released: 08/29/2000 Document Revised: 10/04/2016 Document Reviewed:  10/04/2016 Elsevier Interactive Patient Education  2019 [employer].

## 2018-10-12 NOTE — Progress Notes (Signed)
Cardiology Office Note:    Date:  10/12/2018   ID:  Bernard Kim, DOB Sep 28, 1930, MRN [id number]  PCP:  Marco Collie, MD  Cardiologist:  Jenean Lindau, MD   Referring MD: Marco Collie, MD    ASSESSMENT:    1. Coronary artery disease involving native coronary artery of native heart without angina pectoris   2. Essential hypertension   3. Mixed hyperlipidemia   4. Paroxysmal A-fib ([place])   5. Aortic valve stenosis, etiology of cardiac valve disease unspecified   6. Systolic congestive heart failure, unspecified HF chronicity ([place])    PLAN:    In order of problems listed above:  1. I discussed my findings with the patient at extensive length.  Congestive heart failure education was given to the patient at length.  Previous records including coronary angiography and echocardiogram report was evaluated and discussed with the patient. 2. Diet was discussed for congestive heart failure patient will have a Chem-7 and a BNP today.  We will do a assess current status of left ventricular systolic function and aortic valve. 3. He will be given a prescription for torsemide 10 mg daily.  I will stop his furosemide.  He will be back in 1 week for a Chem-7 and also will keep a track of his weights and blood pressures on a regular basis. 4. Follow-up appointment in a month or earlier if he has any concerns.  Prevention stressed at length.   Medication Adjustments/Labs and Tests Ordered: Current medicines are reviewed at length with the patient today.  Concerns regarding medicines are outlined above.  No orders of the defined types were placed in this encounter.  No orders of the defined types were placed in this encounter.    No chief complaint on file.    History of Present Illness:    Bernard Kim is a 85 y.o. male.  Patient has past medical history of asymmetric septal hypertrophy.  I reviewed records and there is no mention of any issues with mitral valve.  No mitral regurgitation.   Patient difficult aortic stenosis.  He has depressed ejection fraction and was treated by our colleagues in the congestive heart failure clinic in the past.  He is here for follow-up for shortness of breath on exertion.  Patient also has significant coronary artery disease and medical therapy was recommended.  At the time of my evaluation, the patient is alert awake oriented and in no distress.  He denies any orthopnea or PND.  Past Medical History:  Diagnosis Date  . Cancer ([place])    bladdre cancer  . CHF (congestive heart failure) ([place])   . Coronary artery disease   . Dyspnea   . Hypertension   . Stroke [facility])     Past Surgical History:  Procedure Laterality Date  . CARDIAC CATHETERIZATION    . RIGHT/LEFT HEART CATH AND CORONARY ANGIOGRAPHY N/A 09/03/2017   Procedure: RIGHT/LEFT HEART CATH AND CORONARY ANGIOGRAPHY;  Surgeon: Jolaine Artist, MD;  Location: [facility];  Service: Cardiovascular;  Laterality: N/A;    Current Medications: Current Meds  Medication Sig  . acetaminophen (TYLENOL 8 HOUR ARTHRITIS PAIN) 650 MG CR tablet Take 650 mg by mouth every 8 (eight) hours as needed for pain.  Marland Kitchen amiodarone (PACERONE) 200 MG tablet Take 1 tablet (200 mg total) by mouth daily.  Marland Kitchen apixaban (ELIQUIS) 2.5 MG TABS tablet Take 1 tablet (2.5 mg total) by mouth 2 (two) times daily.  Marland Kitchen docusate sodium (COLACE)  100 MG capsule Take 100 mg by mouth.  . furosemide (LASIX) 40 MG tablet Take 1 tablet every other day alternating with 1.5 tablets every other day   . Insulin Glargine (LANTUS) 100 UNIT/ML Solostar Pen Inject 10 Units into the skin daily at 10 pm.  . lovastatin (MEVACOR) 40 MG tablet Take 40 mg by mouth at bedtime.   Marland Kitchen NITROSTAT 0.4 MG SL tablet DISSOLVE 1 TABLET UNDER THE TONGUE EVERY 5 MINUTES AS NEEDED FOR CHEST PAIN.     Allergies:   Spironolactone   Social History   Socioeconomic History  . Marital status: Married    Spouse name: Not on file  . Number of children:  Not on file  . Years of education: Not on file  . Highest education level: Not on file  Occupational History  . Not on file  Social Needs  . Financial resource strain: Not on file  . Food insecurity:    Worry: Not on file    Inability: Not on file  . Transportation needs:    Medical: Not on file    Non-medical: Not on file  Tobacco Use  . Smoking status: Never Smoker  . Smokeless tobacco: Never Used  Substance and Sexual Activity  . Alcohol use: No    Frequency: Never  . Drug use: No  . Sexual activity: Not on file  Lifestyle  . Physical activity:    Days per week: Not on file    Minutes per session: Not on file  . Stress: Not on file  Relationships  . Social connections:    Talks on phone: Not on file    Gets together: Not on file    Attends religious service: Not on file    Active member of club or organization: Not on file    Attends meetings of clubs or organizations: Not on file    Relationship status: Not on file  Other Topics Concern  . Not on file  Social History Narrative  . Not on file     Family History: The patient's family history is not on file.  ROS:   Please see the history of present illness.    All other systems reviewed and are negative.  EKGs/Labs/Other Studies Reviewed:    The following studies were reviewed today: EKG reveals sinus rhythm and nonspecific ST-T changes.   Recent Labs: 12/23/2017: ALT 18; BUN 14; Creatinine, Ser 1.23; Magnesium 1.8; Potassium 3.4; Sodium 135; TSH 1.626  Recent Lipid Panel No results found for: CHOL, TRIG, HDL, CHOLHDL, VLDL, LDLCALC, LDLDIRECT  Physical Exam:    VS:  BP (!) 142/70 (BP Location: Right Arm, Patient Position: Sitting, Cuff Size: Normal)   Pulse 67   Ht 5\' 8"  (1.727 m)   Wt 179 lb (81.2 kg)   SpO2 96%   BMI 27.22 kg/m     Wt Readings from Last 3 Encounters:  10/12/18 179 lb (81.2 kg)  12/23/17 176 lb 4 oz (79.9 kg)  10/21/17 169 lb (76.7 kg)     GEN: Patient is in no acute  distress HEENT: Normal NECK: No JVD; No carotid bruits LYMPHATICS: No lymphadenopathy CARDIAC: Hear sounds regular, 2/6 systolic murmur at the apex. RESPIRATORY:  Clear to auscultation without rales, wheezing or rhonchi  ABDOMEN: Soft, non-tender, non-distended MUSCULOSKELETAL:  No edema; No deformity  SKIN: Warm and dry NEUROLOGIC:  Alert and oriented x 3 PSYCHIATRIC:  Normal affect   Signed, Jenean Lindau, MD  10/12/2018 1:43 PM  [facility]

## 2018-10-13 LAB — BASIC METABOLIC PANEL
BUN/Creatinine Ratio: 16 (ref 10–24)
BUN: 26 mg/dL (ref 8–27)
CO2: 22 mmol/L (ref 20–29)
Calcium: 9.2 mg/dL (ref 8.6–10.2)
Chloride: 97 mmol/L (ref 96–106)
Creatinine, Ser: 1.62 mg/dL — ABNORMAL HIGH (ref 0.76–1.27)
GFR, EST AFRICAN AMERICAN: 44 mL/min/{1.73_m2} — AB (ref >59)
GFR, EST NON AFRICAN AMERICAN: 38 mL/min/{1.73_m2} — AB (ref >59)
Glucose: 204 mg/dL — ABNORMAL HIGH (ref 65–99)
POTASSIUM: 4.6 mmol/L (ref 3.5–5.2)
SODIUM: 137 mmol/L (ref 134–144)

## 2018-10-13 LAB — PRO B NATRIURETIC PEPTIDE: NT-Pro BNP: 401 pg/mL (ref 0–486)

## 2018-10-13 NOTE — Addendum Note (Signed)
Addended by: Ashok Norris on: 10/13/2018 11:41 AM   Modules accepted: Orders

## 2018-10-15 DIAGNOSIS — I129 Hypertensive chronic kidney disease with stage 1 through stage 4 chronic kidney disease, or unspecified chronic kidney disease: Secondary | ICD-10-CM | POA: Diagnosis not present

## 2018-10-15 DIAGNOSIS — E1169 Type 2 diabetes mellitus with other specified complication: Secondary | ICD-10-CM | POA: Diagnosis not present

## 2018-10-15 DIAGNOSIS — E1149 Type 2 diabetes mellitus with other diabetic neurological complication: Secondary | ICD-10-CM | POA: Diagnosis not present

## 2018-10-15 DIAGNOSIS — E782 Mixed hyperlipidemia: Secondary | ICD-10-CM | POA: Diagnosis not present

## 2018-10-18 ENCOUNTER — Ambulatory Visit (INDEPENDENT_AMBULATORY_CARE_PROVIDER_SITE_OTHER): Payer: Medicare Other

## 2018-10-18 DIAGNOSIS — I5020 Unspecified systolic (congestive) heart failure: Secondary | ICD-10-CM | POA: Diagnosis not present

## 2018-10-18 DIAGNOSIS — I2510 Atherosclerotic heart disease of native coronary artery without angina pectoris: Secondary | ICD-10-CM | POA: Diagnosis not present

## 2018-10-18 DIAGNOSIS — I10 Essential (primary) hypertension: Secondary | ICD-10-CM | POA: Diagnosis not present

## 2018-10-18 NOTE — Progress Notes (Signed)
Complete echocardiogram has been performed.  Jimmy Ginia Rudell, RDCS, RVT 

## 2018-10-19 ENCOUNTER — Telehealth: Payer: Self-pay | Admitting: Cardiology

## 2018-10-19 LAB — BASIC METABOLIC PANEL
BUN/Creatinine Ratio: 11 (ref 10–24)
BUN: 17 mg/dL (ref 8–27)
CALCIUM: 9.1 mg/dL (ref 8.6–10.2)
CHLORIDE: 98 mmol/L (ref 96–106)
CO2: 24 mmol/L (ref 20–29)
Creatinine, Ser: 1.57 mg/dL — ABNORMAL HIGH (ref 0.76–1.27)
GFR calc Af Amer: 46 mL/min/{1.73_m2} — ABNORMAL LOW (ref >59)
GFR calc non Af Amer: 40 mL/min/{1.73_m2} — ABNORMAL LOW (ref >59)
Glucose: 167 mg/dL — ABNORMAL HIGH (ref 65–99)
POTASSIUM: 4 mmol/L (ref 3.5–5.2)
Sodium: 139 mmol/L (ref 134–144)

## 2018-10-19 NOTE — Telephone Encounter (Signed)
During patient's last office visit on 10/12/2018, his furosemide was discontinued and torsemide 10 mg daily was added. The pharmacy called to let us know that this medication is not covered by patient's insurance and patient does not want to pay out of pocket for it either. Will have Dr. Geraldo Pitter advise.

## 2018-10-19 NOTE — Telephone Encounter (Signed)
Has questions about some medications

## 2018-10-20 ENCOUNTER — Telehealth: Payer: Self-pay | Admitting: *Deleted

## 2018-10-20 DIAGNOSIS — I10 Essential (primary) hypertension: Secondary | ICD-10-CM

## 2018-10-20 MED ORDER — METOLAZONE 2.5 MG PO TABS: 2.5000 mg | ORAL_TABLET | Freq: Every day | ORAL | 3 refills | Status: DC

## 2018-10-20 NOTE — Telephone Encounter (Signed)
-----   Message from Jenean Lindau, MD sent at 10/19/2018 10:11 AM EST ----- Reduce diuretic to half dose and keep a track of daily weights.  If the weight start creeping up like 1 to 2 pounds in about 3 days then increase the dose as needed.  Keep follow-up appointment.  Call for questions.  cc PCP Jenean Lindau, MD 10/19/2018 10:10 AM

## 2018-10-20 NOTE — Telephone Encounter (Signed)
Let him take metalozone 2.5mg  daily and keep a log of p bp and daily weights and come back for bmp in one week and give [place] his log

## 2018-10-20 NOTE — Telephone Encounter (Signed)
Please refer to most recent encounter.  

## 2018-10-20 NOTE — Telephone Encounter (Signed)
Patient informed of lab results and echocardiogram results. Patient has been taking furosemide 40 mg tablets alternating between 1 tablet and 1.5 tablets daily. Patient advised to stop furosemide. Patient's insurance would not cover torsemide, so Dr. Geraldo Pitter recommended patient to start taking metolazone 2.5 mg daily and return to our [place] office in 1 week to recheck a BMP, no appointment needed. Prescription sent to [facility] as requested.   Patient informed to check his weight, blood pressure, and heart rate daily and log these readings. Patient verbalized understanding and will bring log to the office when he returns for lab work for Dr. Geraldo Pitter to review.   Patient verbalized understanding. No further questions.

## 2018-10-21 ENCOUNTER — Ambulatory Visit: Payer: Medicare Other | Admitting: Cardiology

## 2018-10-22 MED ORDER — FUROSEMIDE 80 MG PO TABS: 80.0000 mg | ORAL_TABLET | Freq: Every day | ORAL | 3 refills | Status: DC

## 2018-10-22 NOTE — Addendum Note (Signed)
Addended by: Austin Miles on: 10/22/2018 04:10 PM   Modules accepted: Orders

## 2018-10-22 NOTE — Telephone Encounter (Signed)
Spoke with a [employer] representative regarding what diuretics are covered under [employer] plan as we have received information from the pharmacy that patient's insurance will not cover torsemide or metolazone. Advised that bumetanide and furosemide would be covered. Informed Dr. Geraldo Pitter who advised patient should restart furosemide at a different dosage. Patient is agreeable to taking furosemide 80 mg daily. Prescription has been sent to [place] and patient will continue with all other recommendations as discussed previously. No further questions.

## 2018-10-28 DIAGNOSIS — I10 Essential (primary) hypertension: Secondary | ICD-10-CM | POA: Diagnosis not present

## 2018-10-28 LAB — BASIC METABOLIC PANEL
BUN/Creatinine Ratio: 13 (ref 10–24)
BUN: 22 mg/dL (ref 8–27)
CO2: 26 mmol/L (ref 20–29)
CREATININE: 1.72 mg/dL — AB (ref 0.76–1.27)
Calcium: 9.2 mg/dL (ref 8.6–10.2)
Chloride: 96 mmol/L (ref 96–106)
GFR, EST AFRICAN AMERICAN: 41 mL/min/{1.73_m2} — AB (ref >59)
GFR, EST NON AFRICAN AMERICAN: 35 mL/min/{1.73_m2} — AB (ref >59)
Glucose: 147 mg/dL — ABNORMAL HIGH (ref 65–99)
POTASSIUM: 3.9 mmol/L (ref 3.5–5.2)
SODIUM: 142 mmol/L (ref 134–144)

## 2018-11-01 ENCOUNTER — Other Ambulatory Visit: Payer: Self-pay

## 2018-11-01 NOTE — Patient Outreach (Signed)
  [name]) Care Management  11/01/2018  Bernard Kim September 04, 1931 [id number]   Telephone Screen  Referral Date: 10/29/2018 Referral Source: MD office Referral Reason: "DM, pharmacy services-patient assistance with meds" Insurance: [place] [employer]   Incoming call from patient returning RN CM call. Patient resides in his home along with his spouse. He reports that he is independent with ADLs/IADLs. Patient drives himself to medical appts. He denies any recent falls. He uses a cane at times.  Conditions: Per chart review, patient has PMH of, but not limited to:CAD,HTN, HLD, A-fib, aortic valve stenosis,CHF and DM. Patient admits that he had not been managing his conditions well up until his PCP appt about five weeks ago. He has been doing better following MD orders. Patient is checking blood sugars about once a day-in the mornings. He reports cbgs have been less than 110. Last A1C was 8.4(Oct 2019). He has scale in the home and reports weighing daily and recording.Patient voices that he was placed on diuretics recently to help with fluid retention. He also states that he has BP cuff in the home but has not been checking everyday but will  begin to do so. RN CM discussed with patient possible RN Health Coach to provide further support/education in managing chronic conditions.   Medications: Per patient report, he is taking about seven meds. He voices that his most expensive meds are Jardiance, Eliquis and Lantus. Patient states that he was started on Jardiance about five weeks ago and MD office has been providing samples but he knows this is only temporarily. He states that PCP also gave him a few samples of Eliquis. He is interested in looking into seeing if he qualifies for any assistance to help with meds and states he is definitely concerned about hitting the coverage gap earlier this year than normal.   Appointments: Patient followed by PCP and saw her about five weeks ago. He  reports he saw cardiologist on last week.  Consent: [facility] services reviewed and discussed with patient. Verbal consent for services given.  Plan: RN CM will send [facility] pharmacy referral for possible med assistance. RN CM will send RN Health Coach for further disease mgmt education and support.   Enzo Montgomery, RN,BSN,CCM [place] [facility] Telephonic Care Management Coordinator Direct Phone: [phone] Toll Free: [phone] Fax: [phone]

## 2018-11-01 NOTE — Patient Outreach (Signed)
[facility]) Care Management  11/01/2018  Bernard Kim 03/12/1931 [id number]   Telephone Screen  Referral Date: 10/29/2018 Referral Source: MD office Referral Reason: "DM, pharmacy services-patient assistance with meds" Insurance: [facility]   Outreach attempt # 1 to patient. No answer. RN CM left HIPAA compliant voicemail message along with contact info.    Plan: RN CM will make outreach attempt to patient within 3-4 business days. RN CM will send unsuccessful outreach letter to patient.   Enzo Montgomery, RN,BSN,CCM [place] [facility] Telephonic Care Management Coordinator Direct Phone: [phone] Toll Free: [phone] Fax: [phone]

## 2018-11-02 ENCOUNTER — Other Ambulatory Visit: Payer: Self-pay | Admitting: *Deleted

## 2018-11-03 ENCOUNTER — Other Ambulatory Visit: Payer: Self-pay | Admitting: Pharmacist

## 2018-11-03 NOTE — Patient Outreach (Signed)
[facility] [facility]) Care Management  [place]   11/03/2018  Bernard Kim 1931-03-17 [id number]  Reason for referral: Medication Assistance with Bernard Kim, Bernard Kim, and Lantus  Referral source: Dr. Nyra Capes Current insurance:[employer]  PMHx includes but not limited to:  CAD, HTN, HLD, PAF on anticoagulation, aortic valve stenosis, CHF  Outreach:  Successful telephone call with Mr. Bernard Kim.  HIPAA identifiers verified.   Subjective:  "I am skimping on my medications trying to make them last."   Patient reports he is currently getting samples of Jardiance from PCP but is worried that he will get into the coverage gap quickly by being on 3 expensive medications.    Objective: Lab Results  Component Value Date   CREATININE 1.72 (H) 10/28/2018   CREATININE 1.57 (H) 10/18/2018   CREATININE 1.62 (H) 10/12/2018  CrCl estimated ~36 ml/min  Lab Results  Component Value Date   HGBA1C 8.0 (H) 09/01/2017    Lipid Panel  No results found for: CHOL, TRIG, HDL, CHOLHDL, VLDL, LDLCALC, LDLDIRECT  BP Readings from Last 3 Encounters:  10/12/18 (!) 142/70  12/23/17 (!) 148/74  10/21/17 134/62    Allergies  Allergen Reactions  . Spironolactone Rash    Medications Reviewed Today    Reviewed by Tarri Glenn, [place] (Certified Medical Assistant) on 10/12/18 at 60  Med List Status: <None>  Medication Order Taking? Sig Documenting Provider Last Dose Status Informant  acetaminophen (TYLENOL 8 HOUR ARTHRITIS PAIN) 650 MG CR tablet [id number] Yes Take 650 mg by mouth every 8 (eight) hours as needed for pain. [provider] Taking Active Pharmacy Records  amiodarone (PACERONE) 200 MG tablet [id number] Yes Take 1 tablet (200 mg total) by mouth daily. Bensimhon, Shaune Pascal, MD Taking Active   apixaban (Bernard Kim) 2.5 MG TABS tablet [id number] Yes Take 1 tablet (2.5 mg total) by mouth 2 (two) times daily. Bensimhon, Shaune Pascal, MD Taking Active   docusate sodium  (COLACE) 100 MG capsule [id number] Yes Take 100 mg by mouth. [provider] Taking Active Pharmacy Records  furosemide (LASIX) 40 MG tablet [id number] Yes Take 1 tablet every other day alternating with 1.5 tablets every other day  [provider] Taking Active   Insulin Glargine (LANTUS) 100 UNIT/ML Solostar Pen [id number] Yes Inject 10 Units into the skin daily at 10 pm. [provider] Taking Active   losartan (COZAAR) 25 MG tablet [id number]  Take 0.5 tablets (12.5 mg total) by mouth daily. Bensimhon, Shaune Pascal, MD  Expired 07/26/18 2359   lovastatin (MEVACOR) 40 MG tablet [id number] Yes Take 40 mg by mouth at bedtime.  [provider] Taking Active Pharmacy Records  NITROSTAT 0.4 MG SL tablet [id number] Yes DISSOLVE 1 TABLET UNDER THE TONGUE EVERY 5 MINUTES AS NEEDED FOR CHEST PAIN. Bensimhon, Shaune Pascal, MD Taking Active           Assessment:  Drugs sorted by system:  Cardiovascular: amiodarone, furosemide, losartan, lovastatin, SL NTG  Gastrointestinal: docusate   Endocrine: insulin glargine, empagliflozin  Pain: acetaminophen  Hematologic: apixaban  Medication Review Findings:  . Empagliflozin: Per PI, use is contraindicated with eGFR < 30 ml/min.  Most recent CrCl ~35 ml/min.  Please monitor renal function closely and discontinue if clinically warranted.   Medication Assistance Findings:  Medication assistance needs identified.   Extra Help:   []  Already receiving Full Extra Help  []  Already receiving Partial Extra Help  []  Eligible based on reported income and assets  [x]  Not Eligible based on  reported income and assets  Patient Assistance Programs: 1) Empagliflozin made by [employer] o Income requirement met: [x]  Yes []  No []  Unknown o Out-of-pocket prescription expenditure met:    []  Yes []  No  []  Unknown  [x]  Not applicable - Patient has met application requirements to apply for this patient assistance program.          2)   Lantus made by [employer] o Income requirement met: [x]  Yes []  No  []  Unknown o Out-of-pocket prescription expenditure met:   []  Yes [x]  No   []  Unknown []  Not applicable (2% - estimated ~$700) - Alternative option is to substitute to Basaglar to avoid having to pay out of pocket requirement.   - Call placed to Dr. Nyra Capes to see if this substitution approved  3)  Apixaban made by [employer] o Income requirement met: [x]  Yes []  No []  Unknown o Out-of-pocket prescription expenditure met:    []  Yes [x]  No  []  Unknown  []  Not applicable (3% - estimated $1000)   Patient has not met application requirements to apply for this patient assistance program.     Plan: -Will await call back from Dr. Nyra Capes regarding Lantus --> Basaglar substitution.   If no response, will call again tomorrow.   [name], PharmD, [place] [phone]

## 2018-11-04 ENCOUNTER — Other Ambulatory Visit: Payer: Self-pay | Admitting: Pharmacist

## 2018-11-04 NOTE — Patient Outreach (Signed)
[facility]) Care Management  [name] 11/04/2018  Bernard Kim [birth year] [id number]  Care coordination call #2 left with Dr. Eustaquio Maize Hodge's office regarding possible substitution of Lantus to [place] for purpose of applying to patient assistance program that does not require out of pocket expenditure.  Message left for RN to return my call.   Plan: F/u again early next week with Dr. Nyra Capes if I have not heard back yet.   [name], PharmD, [place] [phone]

## 2018-11-08 ENCOUNTER — Other Ambulatory Visit: Payer: Self-pay | Admitting: Pharmacist

## 2018-11-08 NOTE — Patient Outreach (Signed)
[place] [facility]) Care Management  [place] 11/08/2018  Bernard Kim June 18, 1931 [id number]  3rd phone call attempt to Dr. Orlin Hilding office regarding patient assistance for [name].  Message left with my contact information.   Plan: Await call back from office.   [name], PharmD, [place] [phone]    s

## 2018-11-09 ENCOUNTER — Other Ambulatory Visit: Payer: Self-pay | Admitting: Pharmacist

## 2018-11-09 ENCOUNTER — Ambulatory Visit: Payer: Self-pay | Admitting: Pharmacist

## 2018-11-09 ENCOUNTER — Other Ambulatory Visit: Payer: Self-pay | Admitting: Pharmacy Technician

## 2018-11-09 NOTE — Patient Outreach (Signed)
[place] [facility]) Care Management  11/09/2018  JIBRIL MCMINN 06-22-1931 [id number]                                                  Medication Assistance Referral  Referral From: [facility] RPh Waynard Reeds  Medication/Company: Vania Rea / [employer] Patient application portion:  Mailed Provider application portion: Faxed  to Dr. Presley Raddle  Medication/Company: Nancee Liter / [employer] Patient application portion:  Mailed Provider application portion: Faxed  to Dr. Presley Raddle   Follow up:  Will follow up with patient in 5-7 business days to confirm application(s) have been received.  Maud Deed Chana Bode [place] Certified Pharmacy Technician [place] Management Direct [phone]

## 2018-11-09 NOTE — Patient Outreach (Signed)
[place] [facility]) Care Management  [place] 11/09/2018  ORAL REMACHE Dec 19, 1930 [id number]  Reason for call: Incoming call and voicemail received from Dr. Eustaquio Maize Hodge's office regarding Mr. Pasion.  Dr. Nyra Capes approves substitution from Lantus to [facility] to apply for patient assistance.   Message left with Mr. Pinkham to provide update that we will mail out applications for [employer] and Jardiance.   Plan: I will route patient assistance letter to [facility] pharmacy technician who will coordinate patient assistance program application process for Basaglar and Jardiance.  [facility] pharmacy technician will assist with obtaining all required documents from both patient and provider(s) and submit application(s) once completed.    [name], PharmD, [place] [phone]

## 2018-11-12 ENCOUNTER — Ambulatory Visit (INDEPENDENT_AMBULATORY_CARE_PROVIDER_SITE_OTHER): Payer: Medicare Other | Admitting: Cardiology

## 2018-11-12 ENCOUNTER — Encounter: Payer: Self-pay | Admitting: Cardiology

## 2018-11-12 VITALS — BP 126/72 | HR 60 | Ht 68.0 in | Wt 178.0 lb

## 2018-11-12 DIAGNOSIS — E782 Mixed hyperlipidemia: Secondary | ICD-10-CM

## 2018-11-12 DIAGNOSIS — I4891 Unspecified atrial fibrillation: Secondary | ICD-10-CM

## 2018-11-12 DIAGNOSIS — I10 Essential (primary) hypertension: Secondary | ICD-10-CM

## 2018-11-12 DIAGNOSIS — I129 Hypertensive chronic kidney disease with stage 1 through stage 4 chronic kidney disease, or unspecified chronic kidney disease: Secondary | ICD-10-CM | POA: Diagnosis not present

## 2018-11-12 DIAGNOSIS — E1149 Type 2 diabetes mellitus with other diabetic neurological complication: Secondary | ICD-10-CM | POA: Diagnosis not present

## 2018-11-12 DIAGNOSIS — I2510 Atherosclerotic heart disease of native coronary artery without angina pectoris: Secondary | ICD-10-CM

## 2018-11-12 DIAGNOSIS — I351 Nonrheumatic aortic (valve) insufficiency: Secondary | ICD-10-CM

## 2018-11-12 DIAGNOSIS — I340 Nonrheumatic mitral (valve) insufficiency: Secondary | ICD-10-CM

## 2018-11-12 DIAGNOSIS — E1169 Type 2 diabetes mellitus with other specified complication: Secondary | ICD-10-CM | POA: Diagnosis not present

## 2018-11-12 LAB — BASIC METABOLIC PANEL
BUN / CREAT RATIO: 12 (ref 10–24)
BUN: 18 mg/dL (ref 8–27)
CO2: 27 mmol/L (ref 20–29)
CREATININE: 1.46 mg/dL — AB (ref 0.76–1.27)
Calcium: 8.9 mg/dL (ref 8.6–10.2)
Chloride: 98 mmol/L (ref 96–106)
GFR calc Af Amer: 50 mL/min/{1.73_m2} — ABNORMAL LOW (ref >59)
GFR, EST NON AFRICAN AMERICAN: 43 mL/min/{1.73_m2} — AB (ref >59)
GLUCOSE: 119 mg/dL — AB (ref 65–99)
Potassium: 3.9 mmol/L (ref 3.5–5.2)
SODIUM: 140 mmol/L (ref 134–144)

## 2018-11-12 NOTE — Patient Instructions (Signed)
Medication Instructions:  Your physician recommends that you continue on your current medications as directed. Please refer to the Current Medication list given to you today.  If you need a refill on your cardiac medications before your next appointment, please call your pharmacy.   Lab work: Today  If you have labs (blood work) drawn today and your tests are completely normal, you will receive your results only by: Marland Kitchen MyChart Message (if you have MyChart) OR . A paper copy in the mail If you have any lab test that is abnormal or we need to change your treatment, we will call you to review the results.  Testing/Procedures: None   Follow-Up: At [facility], you and your health needs are our priority.  As part of our continuing mission to provide you with exceptional heart care, we have created designated Provider Care Teams.  These Care Teams include your primary Cardiologist (physician) and Advanced Practice Providers (APPs -  Physician Assistants and Nurse Practitioners) who all work together to provide you with the care you need, when you need it. You will need a follow up appointment in 2 months.  Please call our office 2 months in advance to schedule this appointment.  You may see Dr. Geraldo Pitter or another member of our [employer] Provider Team in [place]: Jenne Campus, MD . Shirlee More, MD

## 2018-11-12 NOTE — Progress Notes (Signed)
Cardiology Office Note:    Date:  11/12/2018   ID:  Bernard Kim, DOB 1930-09-28, MRN [id number]  PCP:  Marco Collie, MD  Cardiologist:  Jenean Lindau, MD   Referring MD: Marco Collie, MD    ASSESSMENT:    1. Atrial fibrillation with RVR ([place])   2. Coronary artery disease involving native coronary artery of native heart without angina pectoris   3. Essential hypertension   4. Mixed hyperlipidemia   5. Nonrheumatic aortic valve insufficiency   6. Non-rheumatic mitral regurgitation    PLAN:    In order of problems listed above:  1. Secondary prevention stressed with patient.  Importance of compliance with diet and medication stressed and he was pleased understanding. 2. His blood pressure is stable.  Diet was discussed for dyslipidemia.  Lipids followed by his primary care physician. 3. He will have a Chem-7 today. 4. Patient will be seen in follow-up appointment in 6 months or earlier if the patient has any concerns.  Congestive heart failure education and diet was discussed.  He weighs himself on a regular basis.   Medication Adjustments/Labs and Tests Ordered: Current medicines are reviewed at length with the patient today.  Concerns regarding medicines are outlined above.  No orders of the defined types were placed in this encounter.  No orders of the defined types were placed in this encounter.    No chief complaint on file.    History of Present Illness:    Bernard Kim is a 85 y.o. male.  The patient has history of aortic stenosis and regurgitation and mitral regurgitation.  He also has atrial fibrillation and history of pulmonary embolism.  He denies any problems at this time and takes care of activities of daily living.  We increased his diuretic and he is feeling much better.  Is been walking well and his pedal edema is almost resolved.  He denies any chest pain orthopnea PND or shortness of breath.  At the time of my evaluation, the patient is alert awake  oriented and in no distress.  Past Medical History:  Diagnosis Date  . Cancer ([place])    bladdre cancer  . CHF (congestive heart failure) ([place])   . Coronary artery disease   . Dyspnea   . Hypertension   . Stroke [facility])     Past Surgical History:  Procedure Laterality Date  . CARDIAC CATHETERIZATION    . RIGHT/LEFT HEART CATH AND CORONARY ANGIOGRAPHY N/A 09/03/2017   Procedure: RIGHT/LEFT HEART CATH AND CORONARY ANGIOGRAPHY;  Surgeon: Jolaine Artist, MD;  Location: [facility];  Service: Cardiovascular;  Laterality: N/A;    Current Medications: Current Meds  Medication Sig  . acetaminophen (TYLENOL 8 HOUR ARTHRITIS PAIN) 650 MG CR tablet Take 650 mg by mouth every 8 (eight) hours as needed for pain.  Marland Kitchen amiodarone (PACERONE) 200 MG tablet Take 1 tablet (200 mg total) by mouth daily.  Marland Kitchen apixaban (ELIQUIS) 2.5 MG TABS tablet Take 1 tablet (2.5 mg total) by mouth 2 (two) times daily.  Marland Kitchen docusate sodium (COLACE) 100 MG capsule Take 100 mg by mouth daily as needed.   . empagliflozin (JARDIANCE) 25 MG TABS tablet Take 25 mg by mouth daily.  . furosemide (LASIX) 80 MG tablet Take 1 tablet (80 mg total) by mouth daily.  . Insulin Glargine (LANTUS) 100 UNIT/ML Solostar Pen Inject 20 Units into the skin daily at 10 pm.   . losartan (COZAAR) 25 MG tablet Take 12.5 mg  by mouth daily.  Marland Kitchen lovastatin (MEVACOR) 40 MG tablet Take 40 mg by mouth at bedtime.   Marland Kitchen NITROSTAT 0.4 MG SL tablet DISSOLVE 1 TABLET UNDER THE TONGUE EVERY 5 MINUTES AS NEEDED FOR CHEST PAIN.     Allergies:   Spironolactone   Social History   Socioeconomic History  . Marital status: Married    Spouse name: Not on file  . Number of children: Not on file  . Years of education: Not on file  . Highest education level: Not on file  Occupational History  . Not on file  Social Needs  . Financial resource strain: Not on file  . Food insecurity:    Worry: Not on file    Inability: Not on file  . Transportation  needs:    Medical: Not on file    Non-medical: Not on file  Tobacco Use  . Smoking status: Never Smoker  . Smokeless tobacco: Never Used  Substance and Sexual Activity  . Alcohol use: No    Frequency: Never  . Drug use: No  . Sexual activity: Not on file  Lifestyle  . Physical activity:    Days per week: Not on file    Minutes per session: Not on file  . Stress: Not on file  Relationships  . Social connections:    Talks on phone: Not on file    Gets together: Not on file    Attends religious service: Not on file    Active member of club or organization: Not on file    Attends meetings of clubs or organizations: Not on file    Relationship status: Not on file  Other Topics Concern  . Not on file  Social History Narrative  . Not on file     Family History: The patient's family history is not on file.  ROS:   Please see the history of present illness.    All other systems reviewed and are negative.  EKGs/Labs/Other Studies Reviewed:    The following studies were reviewed today:  IMPRESSIONS    1. The left ventricle has normal systolic function of 34-19%. The cavity size is mildly increased. There is no left ventricular wall thickness. Echo evidence of impaired relaxation diastolic filling patterns.  2. Mildly dilated left atrial size.  3. Normal right atrial size.  4. The mitral valve normal in structure. Regurgitation is mild by color flow Doppler.  5. Normal tricuspid valve.  6. The aortic valve tricuspid. There is moderate thickening and mild calcification of the aortic valve. Aortic valve regurgitation is mild to moderate by color flow Doppler. The calculated aortic valve area is 1.04 cm consistent with moderate stenosis  stenosis.  7. Mild to moderate dilatation of the ascending aorta.  8. No atrial level shunt detected by color flow Doppler.   Recent Labs: 12/23/2017: ALT 18; Magnesium 1.8; TSH 1.626 10/12/2018: NT-Pro BNP 401 10/28/2018: BUN 22;  Creatinine, Ser 1.72; Potassium 3.9; Sodium 142  Recent Lipid Panel No results found for: CHOL, TRIG, HDL, CHOLHDL, VLDL, LDLCALC, LDLDIRECT  Physical Exam:    VS:  BP 126/72 (BP Location: Right Arm, Patient Position: Sitting, Cuff Size: Normal)   Pulse 60   Ht 5\' 8"  (1.727 m)   Wt 178 lb (80.7 kg)   SpO2 93%   BMI 27.06 kg/m     Wt Readings from Last 3 Encounters:  11/12/18 178 lb (80.7 kg)  10/12/18 179 lb (81.2 kg)  12/23/17 176 lb 4 oz (79.9 kg)  GEN: Patient is in no acute distress HEENT: Normal NECK: No JVD; No carotid bruits LYMPHATICS: No lymphadenopathy CARDIAC: Hear sounds regular, 2/6 systolic murmur at the apex. RESPIRATORY:  Clear to auscultation without rales, wheezing or rhonchi  ABDOMEN: Soft, non-tender, non-distended MUSCULOSKELETAL:  No edema; No deformity  SKIN: Warm and dry NEUROLOGIC:  Alert and oriented x 3 PSYCHIATRIC:  Normal affect   Signed, Jenean Lindau, MD  11/12/2018 11:10 AM    [place]

## 2018-11-23 ENCOUNTER — Other Ambulatory Visit: Payer: Self-pay | Admitting: Pharmacy Technician

## 2018-11-23 NOTE — Patient Outreach (Addendum)
[place] [facility]) Care Management  11/23/2018  BERNAL LUHMAN Dec 07, 1930 [id number]    Unsuccessful call #1 placed to patient regarding patient assistance application(s) for Jardiance and Basaglar , HIPAA compliant voicemail left.   Follow up:  Will make 2nd cal attempt in 2-3 business days if call has not been returned.  Maud Deed Chana Bode [place] Certified Pharmacy Technician [place] Management Direct Dial:[phone]    ADDENDUM 12:52pm  Incoming call from patient, HIPAA identifiers verified. Mr. Accardo states that he received applications and mailed them back out this morning.  Will submit completed application once all documents have been received.  Maud Deed Chana Bode [place] Certified Pharmacy Technician [place] Management Direct Dial:[phone]

## 2018-11-26 ENCOUNTER — Other Ambulatory Visit: Payer: Self-pay | Admitting: *Deleted

## 2018-11-26 NOTE — Patient Outreach (Signed)
[place] [facility]) Care Management  11/26/2018  Bernard Kim 03/12/1931 [id number]   RN Health Coach Initial Assessment  Referral Date:  11/01/2018 Referral Source:  MD Office Reason for Referral:  Disease Management Education Insurance:  Blue [employer] Blue Shield Medicare   Outreach Attempt:  Outreach attempt #1 to patient for introduction and initial telephone assessment. Wife answered and stated patient was not home. RN Health Coach left HIPAA compliant message along with contact information with patient's wife.  Plan:  RN Health Coach will make another outreach attempt within the month of March.   [place] [phone] [email or web address]

## 2018-11-30 ENCOUNTER — Other Ambulatory Visit: Payer: Self-pay | Admitting: Pharmacy Technician

## 2018-11-30 NOTE — Patient Outreach (Signed)
[place] [facility]) Care Management  11/30/2018  Bernard Kim 12-May-1931 [id number]   Received patient portion(s) of patient assistance application for Jardiance. Faxed completed application and required documents into [employer].  Will follow up with company in 7-10 business days to check status of application.  Will submit [employer] application for [place] when patient portion has been received.  Maud Deed Chana Bode [place] Certified Pharmacy Technician [place] Management Direct Dial:[phone]

## 2018-12-06 ENCOUNTER — Other Ambulatory Visit (HOSPITAL_COMMUNITY): Payer: Self-pay | Admitting: Internal Medicine

## 2018-12-10 ENCOUNTER — Other Ambulatory Visit: Payer: Self-pay | Admitting: *Deleted

## 2018-12-10 ENCOUNTER — Other Ambulatory Visit: Payer: Self-pay

## 2018-12-10 ENCOUNTER — Encounter: Payer: Self-pay | Admitting: *Deleted

## 2018-12-10 NOTE — Patient Outreach (Addendum)
[facility]) Care Management  [place]  12/10/2018   Bernard Kim [birth year] [id number]   [place] Initial Assessment   Referral Date:  11/01/2018 Referral Source:  MD Office Reason for Referral:  Disease Management Education Insurance:  [place] Shield Medicare   Outreach Attempt:  Successful telephone outreach to patient for introduction and initial telephone assessment. HIPAA verified with patient.  RN Health Coach introduced self and role.  Patient verbally agrees to Disease Management outreaches.  Completed initial telephone assessment.  Social:  Lives at home with his wife.  Independent with ADLs and IADLs.  Ambulates independently and denies any falls in the past year, does admit to many stumbles.  Fall precautions and preventions reviewed and discussed with patient.  Drives self to medical appointments.  DME in the home include:  Straight cane, rolling walker, bilateral hearing aids, upper and lower dentures, CBG meter, blood pressure cuff, scale, eyeglasses, grab bars in shower and around toilet.  Conditions:  Per chart review and discussion with patient, PMH includes but not limited to:  Atrial fibrillation, coronary artery disease, hypertension, hyperlipidemia, pulmonary embolism, bladder cancer, congestive heart failure, and stroke.  Reports last Hgb A1C was 8.9 on 09/30/2018.  Monitors blood sugars at least once a day.  Fasting blood sugar this morning was 1010 with fasting ranges of 90-100's.  Monitors blood pressures about daily with ranges of below 140/70's.  Weighs himself daily.  Weight this morning was 173 pounds (normal range of 167-174).  Denies any recent hospitalizations or emergency room visits.  Medications:  Patient reports taking about 7 medications daily.  States he manages his medications himself with weekly pill box fills.  Working with [place] to help affording his medications.  Encounter Medications:  Outpatient  Encounter Medications as of 12/10/2018  Medication Sig  . acetaminophen (TYLENOL 8 HOUR ARTHRITIS PAIN) 650 MG CR tablet Take 650 mg by mouth every 8 (eight) hours as needed for pain.  Marland Kitchen amiodarone (PACERONE) 200 MG tablet Take 1 tablet (200 mg total) by mouth daily.  Marland Kitchen docusate sodium (COLACE) 100 MG capsule Take 100 mg by mouth daily as needed.   Marland Kitchen ELIQUIS 2.5 MG TABS tablet TAKE 1 TABLET BY MOUTH TWICE DAILY.  Marland Kitchen empagliflozin (JARDIANCE) 25 MG TABS tablet Take 25 mg by mouth daily.  . furosemide (LASIX) 80 MG tablet Take 1 tablet (80 mg total) by mouth daily.  . Insulin Glargine (LANTUS) 100 UNIT/ML Solostar Pen Inject 20 Units into the skin daily at 10 pm.   . losartan (COZAAR) 25 MG tablet Take 12.5 mg by mouth daily.  Marland Kitchen lovastatin (MEVACOR) 40 MG tablet Take 40 mg by mouth at bedtime.   Marland Kitchen NITROSTAT 0.4 MG SL tablet DISSOLVE 1 TABLET UNDER THE TONGUE EVERY 5 MINUTES AS NEEDED FOR CHEST PAIN.   No facility-administered encounter medications on file as of 12/10/2018.     Functional Status:  In your present state of health, do you have any difficulty performing the following activities: 12/10/2018  Hearing? Y  Comment bilateral hearing aids  Vision? N  Difficulty concentrating or making decisions? N  Walking or climbing stairs? N  Dressing or bathing? N  Doing errands, shopping? N  Preparing Food and eating ? N  Using the Toilet? N  In the past six months, have you accidently leaked urine? N  Do you have problems with loss of bowel control? N  Managing your Medications? N  Managing your Finances? N  Housekeeping or managing your Housekeeping? N  Some recent data might be hidden    Fall/Depression Screening: Fall Risk  12/10/2018 11/01/2018  Falls in the past year? 0 0  Number falls in past yr: - 0  Injury with Fall? - 0  Risk for fall due to : Impaired vision;Medication side effect -  Follow up Falls evaluation completed;Falls prevention discussed;Education provided -   PHQ  2/9 Scores 12/10/2018 11/01/2018  PHQ - 2 Score 0 0   THN CM Care Plan Problem One     Most Recent Value  Care Plan Problem One  Knowledge deficiet related to self care management of diabetes and heart failure  Role Documenting the Problem One  [place] for Problem One  Active  [facility] Long Term Goal   Patient will report a decrease in Hgb A1C by 0.5 points in the next 60 days.  THN Long Term Goal Start Date  12/10/18  Interventions for Problem One Long Term Goal  Reviewed and discussed current care plan and goals with patient, reviewed and encouraged diabetic diet, encouraged patient to review diabetes education material he has at home from diabetes educational classes he attended last fall, encouraged to keep and attend scheduled medical appointments, reviewed medications and encouraged medication compliance, fall precautions and prevention reviewed and discussed, discussed current hgb A1C and ways to reduce, discussed current blood sugar ranges, encouraged patient to request new presciptions for glucose meter and testing strips  THN CM Short Term Goal #1   Patient will report obtaining Heart Failure Packet within the next 60 days.  THN CM Short Term Goal #1 Start Date  12/10/18  Interventions for Short Term Goal #1  Discussed heart failure diagnosis, reviewed signs and symptoms of heart failure, sending Living Better with Heart Failure Packet, discussed heart failure zones, encouraged patient to review educational material     Appointments:  Attended appointment with Dr. Nyra Capes around 09/30/2018 and has scheduled follow up appointment on 01/07/2019 (labs scheduled for 12/30/2018).  Last seen Cardiologist on 11/12/2018.  Advanced Directives:  States he does have Living Will and [place] in place and does not wish to make any changes at this time.   Consent:  [facility] services reviewed and discussed.  Patient verbally agrees to Disease Management outreaches and to continue  to work with [facility] Pharmacist for medication assistance.  Plan: RN Health Coach will send primary MD barriers letter. RN Health Coach will route initial telephone assessment note to primary MD. [place] will send patient [place]. RN Health Coach will send patient 2020 Calendar Booklet. RN Health Coach will send patient Living Better with Heart Failure. RN Health Coach will make next telephone outreach to patient in the month of May.  [place] [phone] [email or web address]

## 2018-12-14 DIAGNOSIS — E1149 Type 2 diabetes mellitus with other diabetic neurological complication: Secondary | ICD-10-CM | POA: Diagnosis not present

## 2018-12-14 DIAGNOSIS — E782 Mixed hyperlipidemia: Secondary | ICD-10-CM | POA: Diagnosis not present

## 2018-12-14 DIAGNOSIS — I129 Hypertensive chronic kidney disease with stage 1 through stage 4 chronic kidney disease, or unspecified chronic kidney disease: Secondary | ICD-10-CM | POA: Diagnosis not present

## 2018-12-14 DIAGNOSIS — E1169 Type 2 diabetes mellitus with other specified complication: Secondary | ICD-10-CM | POA: Diagnosis not present

## 2018-12-22 ENCOUNTER — Telehealth: Payer: Self-pay

## 2018-12-22 ENCOUNTER — Other Ambulatory Visit: Payer: Self-pay | Admitting: Pharmacy Technician

## 2018-12-22 NOTE — Patient Outreach (Signed)
[facility] [facility]) Care Management  12/22/2018  [name] 01/14/1931 [id number]    Follow up call placed to [employer] regarding patient assistance application(s) for Bernard Kim confirms patient has been approved as of 3/24 until 09/15/19. Medication to arrive at patients home in 7-10 business days.  Follow up:  Will follow up with patient in 3-5 business days to update  [employer]. Chana Bode [place] Certified Pharmacy Technician [place] Management Direct [phone]

## 2018-12-22 NOTE — Telephone Encounter (Signed)
Call patient to discuss appointment had to leave voicemail will continue efforts.

## 2018-12-28 ENCOUNTER — Telehealth: Payer: Self-pay

## 2018-12-28 ENCOUNTER — Encounter: Payer: Self-pay | Admitting: Cardiology

## 2018-12-28 ENCOUNTER — Telehealth (INDEPENDENT_AMBULATORY_CARE_PROVIDER_SITE_OTHER): Payer: Medicare Other | Admitting: Cardiology

## 2018-12-28 VITALS — BP 144/67 | HR 55 | Ht 68.0 in | Wt 170.0 lb

## 2018-12-28 DIAGNOSIS — I2510 Atherosclerotic heart disease of native coronary artery without angina pectoris: Secondary | ICD-10-CM | POA: Diagnosis not present

## 2018-12-28 DIAGNOSIS — I480 Paroxysmal atrial fibrillation: Secondary | ICD-10-CM

## 2018-12-28 DIAGNOSIS — I25119 Atherosclerotic heart disease of native coronary artery with unspecified angina pectoris: Secondary | ICD-10-CM

## 2018-12-28 DIAGNOSIS — E782 Mixed hyperlipidemia: Secondary | ICD-10-CM

## 2018-12-28 DIAGNOSIS — I10 Essential (primary) hypertension: Secondary | ICD-10-CM

## 2018-12-28 NOTE — Patient Instructions (Signed)
Medication Instructions:Your physician recommends that you continue on your current medications as directed. Please refer to the Current Medication list given to you today.  If you need a refill on your cardiac medications before your next appointment, please call your pharmacy.   Lab work: None  If you have labs (blood work) drawn today and your tests are completely normal, you will receive your results only by: Marland Kitchen MyChart Message (if you have MyChart) OR . A paper copy in the mail If you have any lab test that is abnormal or we need to change your treatment, we will call you to review the results.  Testing/Procedures: None  Follow-Up: At [facility], you and your health needs are our priority.  As part of our continuing mission to provide you with exceptional heart care, we have created designated Provider Care Teams.  These Care Teams include your primary Cardiologist (physician) and Advanced Practice Providers (APPs -  Physician Assistants and Nurse Practitioners) who all work together to provide you with the care you need, when you need it. You will need a follow up appointment in 6 months. Any Other Special Instructions Will Be Listed Below (If Applicable).

## 2018-12-28 NOTE — Progress Notes (Signed)
Virtual Visit via Telephone Note   This visit type was conducted due to national recommendations for restrictions regarding the COVID-19 Pandemic (e.g. social distancing) in an effort to limit this patient's exposure and mitigate transmission in our community.  Due to his co-morbid illnesses, this patient is at least at moderate risk for complications without adequate follow up.  This format is felt to be most appropriate for this patient at this time.  The patient did not have access to video technology/had technical difficulties with video requiring transitioning to audio format only (telephone).  All issues noted in this document were discussed and addressed.  No physical exam could be performed with this format.  Please refer to the patient's chart for his  consent to telehealth for [facility].   Evaluation Performed:  Follow-up visit  Date:  12/28/2018   ID:  Bernard Kim, DOB 1930-09-20, MRN [id number]  Patient Location: Home  Provider Location: Home  PCP:  Marco Collie, MD Cardiologist:  No primary care provider on file.  Electrophysiologist:  None   Chief Complaint:  Atrial fibrillation  History of Present Illness:    Bernard Kim is a 85 y.o. male who presents via audio/video conferencing for a telehealth visit today.    Patient has history of atrial fibrillation and denies any problems at this time and takes care of activities of daily living.  No chest pain orthopnea or PND.  He is tolerating anticoagulant well.  He is comfortable with his quality of life and is active and ambulates well age appropriately.  At the time of my evaluation, the patient is alert awake oriented and in no distress.  The patient does not have symptoms concerning for COVID-19 infection (fever, chills, cough, or new shortness of breath).    Past Medical History:  Diagnosis Date  . Cancer ([place])    bladdre cancer  . CHF (congestive heart failure) ([place])   . Coronary artery disease   . Dyspnea    . Hypertension   . Stroke [facility])    Past Surgical History:  Procedure Laterality Date  . CARDIAC CATHETERIZATION    . HERNIA REPAIR    . JOINT REPLACEMENT    . PROSTATE SURGERY    . RIGHT/LEFT HEART CATH AND CORONARY ANGIOGRAPHY N/A 09/03/2017   Procedure: RIGHT/LEFT HEART CATH AND CORONARY ANGIOGRAPHY;  Surgeon: Jolaine Artist, MD;  Location: [place] CV LAB;  Service: Cardiovascular;  Laterality: N/A;     Current Meds  Medication Sig  . acetaminophen (TYLENOL 8 HOUR ARTHRITIS PAIN) 650 MG CR tablet Take 650 mg by mouth every 8 (eight) hours as needed for pain.  Marland Kitchen amiodarone (PACERONE) 200 MG tablet Take 1 tablet (200 mg total) by mouth daily.  Marland Kitchen ELIQUIS 2.5 MG TABS tablet TAKE 1 TABLET BY MOUTH TWICE DAILY. (Patient taking differently: Take 2.5 mg by mouth 2 (two) times daily. )  . empagliflozin (JARDIANCE) 25 MG TABS tablet Take 25 mg by mouth daily.  . furosemide (LASIX) 80 MG tablet Take 1 tablet (80 mg total) by mouth daily.  . Insulin Glargine (LANTUS) 100 UNIT/ML Solostar Pen Inject 20 Units into the skin daily at 10 pm.   . losartan (COZAAR) 25 MG tablet Take 12.5 mg by mouth daily.  Marland Kitchen lovastatin (MEVACOR) 40 MG tablet Take 40 mg by mouth at bedtime.   Marland Kitchen NITROSTAT 0.4 MG SL tablet DISSOLVE 1 TABLET UNDER THE TONGUE EVERY 5 MINUTES AS NEEDED FOR CHEST PAIN.  Allergies:   Spironolactone   Social History   Tobacco Use  . Smoking status: Never Smoker  . Smokeless tobacco: Never Used  Substance Use Topics  . Alcohol use: No    Frequency: Never  . Drug use: No     Family Hx: The patient's family history is not on file.  ROS:   Please see the history of present illness.    Patient denies any chest pain orthopnea or PND and is comfortable and takes care of activities of daily living All other systems reviewed and are negative.   Prior CV studies:   The following studies were reviewed today:  None  Labs/Other Tests and Data Reviewed:    EKG: Was not  done or reviewed today  Recent Labs: 10/12/2018: NT-Pro BNP 401 11/12/2018: BUN 18; Creatinine, Ser 1.46; Potassium 3.9; Sodium 140   Recent Lipid Panel No results found for: CHOL, TRIG, HDL, CHOLHDL, LDLCALC, LDLDIRECT  Wt Readings from Last 3 Encounters:  12/28/18 170 lb (77.1 kg)  11/12/18 178 lb (80.7 kg)  10/12/18 179 lb (81.2 kg)     Objective:    Vital Signs:  BP (!) 144/67 (BP Location: Left Arm, Patient Position: Sitting, Cuff Size: Normal)   Pulse (!) 55   Ht 5\' 8"  (1.727 m)   Wt 170 lb (77.1 kg)   BMI 25.85 kg/m    Well nourished, well developed male in no acute distress. He appeared comfortable over the phone without any symptoms  ASSESSMENT & PLAN:    1. Atrial fibrillation:I discussed with the patient atrial fibrillation, disease process. Management and therapy including rate and rhythm control, anticoagulation benefits and potential risks were discussed extensively with the patient. Patient had multiple questions which were answered to patient's satisfaction. 2. His blood pressure is also stable and he is happy with his overall quality of life.  He mentions to me that he is going to have blood work in the next 2 to 3 weeks by his primary care physician and he will send [place] a copy. 3. Patient will be seen in follow-up appointment in 6 months or earlier if the patient has any concerns   COVID-19 Education: The signs and symptoms of COVID-19 were discussed with the patient and how to seek care for testing (follow up with PCP or arrange E-visit).  The importance of social distancing was discussed today.  Time:   Today, I have spent 15 minutes with the patient with telehealth technology discussing the above problems.     Medication Adjustments/Labs and Tests Ordered: Current medicines are reviewed at length with the patient today.  Concerns regarding medicines are outlined above.   Tests Ordered: No orders of the defined types were placed in this encounter.    Medication Changes: No orders of the defined types were placed in this encounter.   Disposition:  Follow up in 6 month(s)  Signed, Jenean Lindau, MD  12/28/2018 11:51 AM    [place]

## 2018-12-28 NOTE — Telephone Encounter (Signed)
Called patient three times phone line was busy will continue efforts.

## 2018-12-30 DIAGNOSIS — Z139 Encounter for screening, unspecified: Secondary | ICD-10-CM | POA: Diagnosis not present

## 2018-12-30 DIAGNOSIS — I129 Hypertensive chronic kidney disease with stage 1 through stage 4 chronic kidney disease, or unspecified chronic kidney disease: Secondary | ICD-10-CM | POA: Diagnosis not present

## 2018-12-30 DIAGNOSIS — Z1331 Encounter for screening for depression: Secondary | ICD-10-CM | POA: Diagnosis not present

## 2018-12-30 DIAGNOSIS — Z0000 Encounter for general adult medical examination without abnormal findings: Secondary | ICD-10-CM | POA: Diagnosis not present

## 2018-12-30 DIAGNOSIS — E1149 Type 2 diabetes mellitus with other diabetic neurological complication: Secondary | ICD-10-CM | POA: Diagnosis not present

## 2018-12-30 DIAGNOSIS — E785 Hyperlipidemia, unspecified: Secondary | ICD-10-CM | POA: Diagnosis not present

## 2019-01-05 ENCOUNTER — Ambulatory Visit: Payer: Medicare Other | Admitting: Cardiology

## 2019-01-07 DIAGNOSIS — N182 Chronic kidney disease, stage 2 (mild): Secondary | ICD-10-CM | POA: Diagnosis not present

## 2019-01-07 DIAGNOSIS — E785 Hyperlipidemia, unspecified: Secondary | ICD-10-CM | POA: Diagnosis not present

## 2019-01-07 DIAGNOSIS — E1169 Type 2 diabetes mellitus with other specified complication: Secondary | ICD-10-CM | POA: Diagnosis not present

## 2019-01-07 DIAGNOSIS — I129 Hypertensive chronic kidney disease with stage 1 through stage 4 chronic kidney disease, or unspecified chronic kidney disease: Secondary | ICD-10-CM | POA: Diagnosis not present

## 2019-01-13 DIAGNOSIS — E785 Hyperlipidemia, unspecified: Secondary | ICD-10-CM | POA: Diagnosis not present

## 2019-01-13 DIAGNOSIS — E1169 Type 2 diabetes mellitus with other specified complication: Secondary | ICD-10-CM | POA: Diagnosis not present

## 2019-01-13 DIAGNOSIS — N182 Chronic kidney disease, stage 2 (mild): Secondary | ICD-10-CM | POA: Diagnosis not present

## 2019-01-13 DIAGNOSIS — I129 Hypertensive chronic kidney disease with stage 1 through stage 4 chronic kidney disease, or unspecified chronic kidney disease: Secondary | ICD-10-CM | POA: Diagnosis not present

## 2019-01-21 ENCOUNTER — Other Ambulatory Visit: Payer: Self-pay | Admitting: Pharmacy Technician

## 2019-01-21 ENCOUNTER — Encounter: Payer: Self-pay | Admitting: Pharmacy Technician

## 2019-01-21 NOTE — Patient Outreach (Signed)
[facility]) Care Management  01/21/2019  ASAHD CAN 1931-01-09 [id number]    Successful call placed to patient regarding patient assistance medication receipt from [facility], HIPAA identifiers verified. Mr. Bernard Kim confirms that he received Jardiance.  Also discussed with Mr. Bernard Kim that he had not sent back the application for Basaglar thru [employer]. He requested that I mail him another. He also asked about his Eliquis. I informed him that the program requires that he spends 3% of income on all his medications before he can apply. He does not think that he has met that requirement. I informed him that he would need to send a printout of spending for Eliquis application and that he can submit his wife's as well.  Will prepare [place] and [place] application to be mailed to patient.  Follow up:  Will follow up with patient in 7-10 business days to confirm applications have been received.  Maud Deed Chana Bode [place] Certified Pharmacy Technician [place] Management Direct Dial:[phone]

## 2019-02-01 ENCOUNTER — Other Ambulatory Visit: Payer: Self-pay | Admitting: *Deleted

## 2019-02-01 ENCOUNTER — Encounter: Payer: Self-pay | Admitting: *Deleted

## 2019-02-01 NOTE — Patient Outreach (Signed)
[facility]) Care Management  [facility] Care Manager  02/01/2019   Bernard Kim 10/28/1930 [id number]   [place] Quarterly Outreach  Referral Date:  11/01/2018 Referral Source:  MD Office Reason for Referral:  Disease Management Education Insurance:  [place] Shield Medicare   Outreach Attempt:  Successful telephone outreach to patient for follow up.  HIPAA verified with patient.  Patient reporting he is doing well, practicing social distancing and staying home.  Does report he gets out to get groceries or pick up medications from pharmacy.  Reports he is receiving his Jardiance from the medication assistance program and has the application for Lantus and needs to send back in.  Encouraged patient to return application as soon as possible.  Congratulated patient on reduction of Hgb A1C down to 7.4 on 12/30/2018.  Reports fasting blood sugar this morning was 130 with normal fasting ranges of 90-110's (today's blood sugar slightly elevated related to celebrating his birthday this past weekend).  Continues to weigh himself daily.  Weight this morning was 167 pounds with normal ranges of 165-170 pounds.  Denies any shortness of breath or lower extremity edema.  Encounter Medications:  Outpatient Encounter Medications as of 02/01/2019  Medication Sig Note  . acetaminophen (TYLENOL 8 HOUR ARTHRITIS PAIN) 650 MG CR tablet Take 650 mg by mouth every 8 (eight) hours as needed for pain.   Marland Kitchen amiodarone (PACERONE) 200 MG tablet Take 1 tablet (200 mg total) by mouth daily.   Marland Kitchen ELIQUIS 2.5 MG TABS tablet TAKE 1 TABLET BY MOUTH TWICE DAILY. (Patient taking differently: Take 2.5 mg by mouth 2 (two) times daily. )   . empagliflozin (JARDIANCE) 25 MG TABS tablet Take 25 mg by mouth daily.   . Insulin Glargine (LANTUS) 100 UNIT/ML Solostar Pen Inject 20 Units into the skin daily at 10 pm.    . losartan (COZAAR) 25 MG tablet Take 12.5 mg by mouth daily.   Marland Kitchen lovastatin (MEVACOR) 40 MG  tablet Take 40 mg by mouth at bedtime.    Marland Kitchen NITROSTAT 0.4 MG SL tablet DISSOLVE 1 TABLET UNDER THE TONGUE EVERY 5 MINUTES AS NEEDED FOR CHEST PAIN.   . furosemide (LASIX) 80 MG tablet Take 1 tablet (80 mg total) by mouth daily. 02/01/2019: Continues to take   No facility-administered encounter medications on file as of 02/01/2019.     Functional Status:  In your present state of health, do you have any difficulty performing the following activities: 12/10/2018  Hearing? Y  Comment bilateral hearing aids  Vision? N  Difficulty concentrating or making decisions? N  Walking or climbing stairs? N  Dressing or bathing? N  Doing errands, shopping? N  Preparing Food and eating ? N  Using the Toilet? N  In the past six months, have you accidently leaked urine? N  Do you have problems with loss of bowel control? N  Managing your Medications? N  Managing your Finances? N  Housekeeping or managing your Housekeeping? N  Some recent data might be hidden    Fall/Depression Screening: Fall Risk  02/01/2019 12/10/2018 11/01/2018  Falls in the past year? 0 0 0  Number falls in past yr: - - 0  Injury with Fall? - - 0  Risk for fall due to : Impaired vision;Medication side effect Impaired vision;Medication side effect -  Follow up Falls evaluation completed;Falls prevention discussed;Education provided Falls evaluation completed;Falls prevention discussed;Education provided -   [facility] 2/9 Scores 12/10/2018 11/01/2018  PHQ - 2  Score 0 0   THN CM Care Plan Problem One     Most Recent Value  Care Plan Problem One  Knowledge deficiet related to self care management of diabetes  Role Documenting the Problem One  [place] for Problem One  Active  THN Long Term Goal   Patient will report maintianing a Hgb A1C below 7.5 in the next 90 days.  THN Long Term Goal Start Date  02/01/19  THN Long Term Goal Met Date  02/01/19  Interventions for Problem One Long Term Goal  Congratulated patient and A1C  reduction, reviewed and discussed care plan and goals, encouraged to keep and attend medical appointments, reviewed medications and encouraged medication compliance, encouraged patient to return lantus medication assistance application to [place], discussed medication assistance for eliquis, encouraged patient to review heart failure educational material and discussed heart failure zones, discussed and encouraged diabetic low salt diet, encouraged healthier meal and drink options  THN CM Short Term Goal #1   Patient will report obtaining Heart Failure Packet within the next 60 days.  THN CM Short Term Goal #1 Start Date  12/10/18  [facility] CM Short Term Goal #1 Met Date  02/01/19     Appointments:  Attended telehealth appointment with primary care provider on 01/07/2019 and follow up is scheduled for July 2020.  Plan: RN Health Coach will send primary care provider quarterly update. RN Health Coach will make next telephone outreach to patient within the month of August.  Tyrann Donaho RN [place] [phone] [email or web address]

## 2019-02-11 DIAGNOSIS — I129 Hypertensive chronic kidney disease with stage 1 through stage 4 chronic kidney disease, or unspecified chronic kidney disease: Secondary | ICD-10-CM | POA: Diagnosis not present

## 2019-02-11 DIAGNOSIS — E1169 Type 2 diabetes mellitus with other specified complication: Secondary | ICD-10-CM | POA: Diagnosis not present

## 2019-02-11 DIAGNOSIS — N182 Chronic kidney disease, stage 2 (mild): Secondary | ICD-10-CM | POA: Diagnosis not present

## 2019-02-11 DIAGNOSIS — E785 Hyperlipidemia, unspecified: Secondary | ICD-10-CM | POA: Diagnosis not present

## 2019-02-24 ENCOUNTER — Other Ambulatory Visit: Payer: Self-pay | Admitting: Pharmacy Technician

## 2019-02-24 NOTE — Patient Outreach (Signed)
[place] [facility]) Care Management  02/24/2019  Bernard Kim 1931-04-03 [id number]    Successful call placed to patient regarding patient assistance application(s) for Eliquis and Basaglar , HIPAA identifiers verified. Bernard Kim states that he received patient assistance applications. He thought the [place] had already been submitted. I explained to him that he has not sent in his portion of the application for [place] to do so. He stated that he would mail it back in. He also thinks that he has not met the OOP spend requirement for Eliquis. I explained to him that the requirements for program and informed him that he can submit his wife's OOP spend as well. He states that he will look into what they have spent, but will in the meantime mail back [employer] for [employer].   Follow up:  Will submit to [employer] once all documents have been received.  Maud Deed Chana Bode [place] Certified Pharmacy Technician [place] Management Direct Dial:[phone]

## 2019-02-28 ENCOUNTER — Other Ambulatory Visit: Payer: Self-pay | Admitting: Pharmacy Technician

## 2019-02-28 NOTE — Patient Outreach (Signed)
[facility]) Care Management  02/28/2019  ESSA MALACHI 1931/01/17 [id number]   Received patient and provider portion(s) of patient assistance application for [place]. Faxed completed application and required documents into [employer].  Will follow up with company in 5-7 business days to check status of application.  Maud Deed Chana Bode [place] Certified Pharmacy Technician [place] Management Direct Dial:[phone]

## 2019-03-04 DIAGNOSIS — C672 Malignant neoplasm of lateral wall of bladder: Secondary | ICD-10-CM | POA: Diagnosis not present

## 2019-03-07 ENCOUNTER — Other Ambulatory Visit: Payer: Self-pay | Admitting: Pharmacy Technician

## 2019-03-07 NOTE — Patient Outreach (Signed)
[facility]) Care Management  03/07/2019  CHRISTIPHER RIEGER 12/19/1930 [id number]   Received patient portion(s) of patient assistance application for Eliquis. Faxed completed application and required documents into [employer].  Will follow up with company in 5-7 business days to check status of application.  Follow up call placed to [facility] regarding patient assistance application(s) for [place] confirms patient has been approved as of 6/19 until 09/15/19. Faxed script portion of application into [facility].  Follow up:  Will follow up with [facility] in 3-5 business days to ship shipment status. Will follow up with [employer] in 5-7 business days to check status of Eliquis application.  Maud Deed Chana Bode [place] Certified Pharmacy Technician [place] Management Direct [phone]

## 2019-03-09 ENCOUNTER — Other Ambulatory Visit: Payer: Self-pay | Admitting: Cardiology

## 2019-03-09 NOTE — Telephone Encounter (Signed)
Furosemide refill sent to [place]

## 2019-03-15 DIAGNOSIS — N182 Chronic kidney disease, stage 2 (mild): Secondary | ICD-10-CM | POA: Diagnosis not present

## 2019-03-15 DIAGNOSIS — E785 Hyperlipidemia, unspecified: Secondary | ICD-10-CM | POA: Diagnosis not present

## 2019-03-15 DIAGNOSIS — E1169 Type 2 diabetes mellitus with other specified complication: Secondary | ICD-10-CM | POA: Diagnosis not present

## 2019-03-15 DIAGNOSIS — I129 Hypertensive chronic kidney disease with stage 1 through stage 4 chronic kidney disease, or unspecified chronic kidney disease: Secondary | ICD-10-CM | POA: Diagnosis not present

## 2019-03-16 ENCOUNTER — Other Ambulatory Visit: Payer: Self-pay | Admitting: Pharmacy Technician

## 2019-03-16 NOTE — Patient Outreach (Signed)
[facility]) Care Management  03/16/2019  Bernard Kim 08/28/1931 [id number]    Unsuccessful call #1 placed to patient regarding patient assistance update for Basaglar, HIPAA compliant voicemail left.   Follow up:  Will make 2nd call attempt in 2-3 business days, if call has not been returned.  Maud Deed Chana Bode [place] Certified Pharmacy Technician [place] Management Direct Dial:[phone]

## 2019-03-17 ENCOUNTER — Other Ambulatory Visit: Payer: Self-pay | Admitting: Pharmacy Technician

## 2019-03-17 NOTE — Patient Outreach (Signed)
[place] [facility]) Care Management  03/17/2019  LAWERANCE MATSUO [birth year] [id number]   Incoming call from patient, HIPAA identifiers verified. 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 Shedd of Engineer, agricultural approval, provided him with [facility] [employer]) contact number to call and schedule shipment of medication. Mr. Zidek states he has enough medication to last 2-3 more weeks.  Mr. Markgraf confirms that he received a call from [place] him of the requirement to spend an additional $305 OOP to be approved for program. He states that he is going to keep up with it.   Will follow up with patient in 5-7 business days to confirm he has set of shipment for his [place].  Maud Deed Chana Bode [facility] Certified Pharmacy Technician [place] Management Direct Dial:[phone]

## 2019-04-05 DIAGNOSIS — I129 Hypertensive chronic kidney disease with stage 1 through stage 4 chronic kidney disease, or unspecified chronic kidney disease: Secondary | ICD-10-CM | POA: Diagnosis not present

## 2019-04-05 DIAGNOSIS — E1169 Type 2 diabetes mellitus with other specified complication: Secondary | ICD-10-CM | POA: Diagnosis not present

## 2019-04-07 ENCOUNTER — Other Ambulatory Visit: Payer: Self-pay | Admitting: Pharmacist

## 2019-04-07 NOTE — Patient Outreach (Signed)
[name] [facility]) Care Management  [place] 04/07/2019  Bernard Kim 1930/12/22 [id number]  Reason for call: f/u on receipt of Basaglar medication  Outreach:  Unsuccessful telephone call attempt #1 to patient. HIPAA compliant voicemail left requesting a return call  Plan:  -I will make another outreach attempt to patient within 3-4 business days.    [name], PharmD, [place] [phone]

## 2019-04-08 ENCOUNTER — Ambulatory Visit: Payer: Self-pay | Admitting: Pharmacist

## 2019-04-08 ENCOUNTER — Other Ambulatory Visit: Payer: Self-pay | Admitting: Pharmacist

## 2019-04-08 ENCOUNTER — Other Ambulatory Visit (HOSPITAL_COMMUNITY): Payer: Self-pay | Admitting: Internal Medicine

## 2019-04-08 NOTE — Patient Outreach (Signed)
[place] [facility]) Care Management  [place] 04/08/2019  LENDELL GALLICK 07-Oct-1930 [id number]  Reason for call: f/u on Basaglar   Patient reports that the Basaglar medication is arriving this morning from [facility].  He has been approved for the patient assistance program through the end of 2020.  He is aware of how to order refills if needed.  No further questions on this program today from patient.    Patient still needs to spend at least $305 out-of-pocket on copays before he is eligible for the Eliquis patient assistance program through [employer].  Patient voiced understanding.  I provided him with my direct contact information once he has reached this TROOP so we can complete his application.   Plan: [place] case is being closed due to the following reasons: -Goals of care have been met. -Thank you for allowing [facility] pharmacy to be involved in this patient's care.    [name], PharmD, [place] [phone]

## 2019-04-14 DIAGNOSIS — E1169 Type 2 diabetes mellitus with other specified complication: Secondary | ICD-10-CM | POA: Diagnosis not present

## 2019-04-14 DIAGNOSIS — E782 Mixed hyperlipidemia: Secondary | ICD-10-CM | POA: Diagnosis not present

## 2019-04-14 DIAGNOSIS — I129 Hypertensive chronic kidney disease with stage 1 through stage 4 chronic kidney disease, or unspecified chronic kidney disease: Secondary | ICD-10-CM | POA: Diagnosis not present

## 2019-04-14 DIAGNOSIS — N182 Chronic kidney disease, stage 2 (mild): Secondary | ICD-10-CM | POA: Diagnosis not present

## 2019-04-15 DIAGNOSIS — N182 Chronic kidney disease, stage 2 (mild): Secondary | ICD-10-CM | POA: Diagnosis not present

## 2019-04-15 DIAGNOSIS — E1169 Type 2 diabetes mellitus with other specified complication: Secondary | ICD-10-CM | POA: Diagnosis not present

## 2019-04-15 DIAGNOSIS — E782 Mixed hyperlipidemia: Secondary | ICD-10-CM | POA: Diagnosis not present

## 2019-04-15 DIAGNOSIS — I129 Hypertensive chronic kidney disease with stage 1 through stage 4 chronic kidney disease, or unspecified chronic kidney disease: Secondary | ICD-10-CM | POA: Diagnosis not present

## 2019-04-21 ENCOUNTER — Other Ambulatory Visit: Payer: Self-pay | Admitting: *Deleted

## 2019-04-21 ENCOUNTER — Encounter: Payer: Self-pay | Admitting: *Deleted

## 2019-04-21 NOTE — Patient Outreach (Signed)
[place] [facility]) Care Management  [place]  04/21/2019   Bernard Kim Aug 14, 1931 [id number]   [place] Outreach   Referral Date:  11/01/2018 Referral Source:  MD Office Reason for Referral:  Disease Management Education Insurance:  [place] Shield Medicare   Outreach Attempt:  Successful telephone outreach to patient for follow up.  HIPAA verified with patient.  Patient reporting he is doing well.  Does report his Hgb A1C has increased to 7.9.  Admits to not avoiding high carbohydrates drinks (sodas and milkshakes).  Discussed importance of glycemic control and avoiding sodas.  Fasting blood sugar this morning was 108 with fasting ranges of 100-110's.  Patient currently only monitoring blood sugars daily, in the morning.  States physician has requested him to monitor twice a day and to keep a record.  Discussed with patient probable increase in blood sugars in the afternoon based on him ingesting sodas and milkshakes.  Encouraged patient to limit high carbohydrate foods and drinks.  Patient stated his understanding and states he will try to stick to low carbohydrate modified diet.  Encounter Medications:  Outpatient Encounter Medications as of 04/21/2019  Medication Sig  . acetaminophen (TYLENOL 8 HOUR ARTHRITIS PAIN) 650 MG CR tablet Take 650 mg by mouth every 8 (eight) hours as needed for pain.  Marland Kitchen amiodarone (PACERONE) 200 MG tablet Take 1 tablet (200 mg total) by mouth daily.  Marland Kitchen ELIQUIS 2.5 MG TABS tablet TAKE 1 TABLET BY MOUTH TWICE DAILY. (Patient taking differently: Take 2.5 mg by mouth 2 (two) times daily. )  . empagliflozin (JARDIANCE) 25 MG TABS tablet Take 25 mg by mouth daily.  . furosemide (LASIX) 80 MG tablet TAKE 1 TABLET ONCE DAILY.  Marland Kitchen Insulin Glargine (LANTUS) 100 UNIT/ML Solostar Pen Inject 20 Units into the skin daily at 10 pm.   . losartan (COZAAR) 25 MG tablet Take 12.5 mg by mouth daily.  Marland Kitchen lovastatin (MEVACOR) 40 MG tablet  Take 40 mg by mouth at bedtime.   Marland Kitchen NITROSTAT 0.4 MG SL tablet DISSOLVE 1 TABLET UNDER THE TONGUE EVERY 5 MINUTES AS NEEDED FOR CHEST PAIN.   No facility-administered encounter medications on file as of 04/21/2019.     Functional Status:  In your present state of health, do you have any difficulty performing the following activities: 12/10/2018  Hearing? Y  Comment bilateral hearing aids  Vision? N  Difficulty concentrating or making decisions? N  Walking or climbing stairs? N  Dressing or bathing? N  Doing errands, shopping? N  Preparing Food and eating ? N  Using the Toilet? N  In the past six months, have you accidently leaked urine? N  Do you have problems with loss of bowel control? N  Managing your Medications? N  Managing your Finances? N  Housekeeping or managing your Housekeeping? N  Some recent data might be hidden    Fall/Depression Screening: Fall Risk  04/21/2019 02/01/2019 12/10/2018  Falls in the past year? 0 0 0  Number falls in past yr: - - -  Injury with Fall? - - -  Risk for fall due to : Medication side effect;Impaired vision Impaired vision;Medication side effect Impaired vision;Medication side effect  Follow up Falls evaluation completed;Education provided;Falls prevention discussed Falls evaluation completed;Falls prevention discussed;Education provided Falls evaluation completed;Falls prevention discussed;Education provided   PHQ 2/9 Scores 12/10/2018 11/01/2018  PHQ - 2 Score 0 0   THN CM Care Plan Problem One     Most Recent  Value  Care Plan Problem One  Knowledge deficiet related to self care management of diabetes  Role Documenting the Problem One  [place] for Problem One  Active  [facility] Long Term Goal   Patient will report a decrease in Hgb A1C by 0.3 points in the next 90 days.  [facility]N Long Term Goal Start Date  04/21/19  Interventions for Problem One Long Term Goal  Care plan reviewed and discussed, encouraged to keep and attend scheduled  medical appointments, discussed current A1C and ways to reduce, encouraged healthier low carbohydrate meal and drink options, encouraged patient to limit sodas and milkshakes, reviewed medications and encouraged medication compliance, confirmed patient knows how to get medication supplies from medication assistance, offered diet education to be mailed out to patient (patient declines at this time)  [facility] CM Short Term Goal #1   Patient will report taking and recording blood sugars twice a day in the next 60 days.  [facility]N CM Short Term Goal #1 Start Date  04/21/19  Interventions for Short Term Goal #1  Reviewed current fasting blood sugar ranges with patient, encouraged monitoring blood sugars twice a day as provider requested, confirmed patient has plan to keep record/log of blood sugars, encouraged patient to reduce intake of sodas and milkshakes, encouraged patient to take blood sugar log to primary care appointment for provider review     Appointments:  Attended appointment with Tennis Must. Nyra Capes, primary care provider on 04/14/2019 and scheduled follow up in October/November.  Plan: RN Health Coach will send primary care provider quarterly update. RN Health Coach will make next telephone outreach to patient within the month of October.  [place] [phone] [email or web address]

## 2019-05-16 DIAGNOSIS — I129 Hypertensive chronic kidney disease with stage 1 through stage 4 chronic kidney disease, or unspecified chronic kidney disease: Secondary | ICD-10-CM | POA: Diagnosis not present

## 2019-05-16 DIAGNOSIS — E782 Mixed hyperlipidemia: Secondary | ICD-10-CM | POA: Diagnosis not present

## 2019-05-16 DIAGNOSIS — N182 Chronic kidney disease, stage 2 (mild): Secondary | ICD-10-CM | POA: Diagnosis not present

## 2019-05-16 DIAGNOSIS — E1169 Type 2 diabetes mellitus with other specified complication: Secondary | ICD-10-CM | POA: Diagnosis not present

## 2019-05-30 DIAGNOSIS — C672 Malignant neoplasm of lateral wall of bladder: Secondary | ICD-10-CM | POA: Diagnosis not present

## 2019-06-03 DIAGNOSIS — Z7984 Long term (current) use of oral hypoglycemic drugs: Secondary | ICD-10-CM | POA: Diagnosis not present

## 2019-06-03 DIAGNOSIS — H40003 Preglaucoma, unspecified, bilateral: Secondary | ICD-10-CM | POA: Diagnosis not present

## 2019-06-03 DIAGNOSIS — E119 Type 2 diabetes mellitus without complications: Secondary | ICD-10-CM | POA: Diagnosis not present

## 2019-06-13 ENCOUNTER — Other Ambulatory Visit: Payer: Self-pay

## 2019-06-13 NOTE — Patient Outreach (Signed)
[place] [facility]) Care Management  06/13/2019  Bernard Kim [birth year] [id number]   Medication Adherence call to Mr. Bernard Kim spoke with patients wife she ask to call back at a later time.Bernard Kim is showing past due on Losartan 25 mg under [place].   [place] Management Direct Dial [phone]  Fax [phone] [email or web address]

## 2019-06-15 DIAGNOSIS — I129 Hypertensive chronic kidney disease with stage 1 through stage 4 chronic kidney disease, or unspecified chronic kidney disease: Secondary | ICD-10-CM | POA: Diagnosis not present

## 2019-06-15 DIAGNOSIS — N182 Chronic kidney disease, stage 2 (mild): Secondary | ICD-10-CM | POA: Diagnosis not present

## 2019-06-15 DIAGNOSIS — E782 Mixed hyperlipidemia: Secondary | ICD-10-CM | POA: Diagnosis not present

## 2019-06-15 DIAGNOSIS — E1169 Type 2 diabetes mellitus with other specified complication: Secondary | ICD-10-CM | POA: Diagnosis not present

## 2019-06-18 DIAGNOSIS — N183 Chronic kidney disease, stage 3 unspecified: Secondary | ICD-10-CM | POA: Insufficient documentation

## 2019-06-18 HISTORY — DX: Chronic kidney disease, stage 3 unspecified: N18.30

## 2019-06-18 NOTE — Progress Notes (Signed)
Cardiology Office Note:    Date:  06/20/2019   ID:  LAVONE LAUTURE, DOB 20-Jun-1931, MRN [id number]  PCP:  Marco Collie, MD  Cardiologist:  Shirlee More, MD    Referring MD: Marco Collie, MD    ASSESSMENT:    1. Paroxysmal A-fib ([place])   2. On amiodarone therapy   3. Chronic anticoagulation   4. Coronary artery disease involving native coronary artery of native heart with angina pectoris ([place])   5. Hypertensive heart disease with heart failure ([place])   6. Mixed hyperlipidemia   7. Aortic valve stenosis, etiology of cardiac valve disease unspecified   8. Nonrheumatic aortic valve insufficiency   9. Stage 3b chronic kidney disease    PLAN:    In order of problems listed above:  1. He is maintaining sinus rhythm but is lightheaded, decrease amiodarone by 50% check thyroid with bradycardia and continue his current anticoagulant 2. Check CMP thyroid chest x-ray regarding toxicity check level reduce dose 3. Continue reduced dose anticoagulant with CKD 4. CAD is stable rarely has angina continue medical treatment and if he has progressive symptoms consider PCI and stent 5. Stable hypertension and heart failure continue his current diuretic 6. Continue statin check liver function lipids 7. Clinically I suspect he now has severe aortic stenosis recheck echocardiogram 8. Recheck renal function with a history of CKD   Next appointment: 3 mos   Medication Adjustments/Labs and Tests Ordered: Current medicines are reviewed at length with the patient today.  Concerns regarding medicines are outlined above.  No orders of the defined types were placed in this encounter.  No orders of the defined types were placed in this encounter.   Chief Complaint  Patient presents with   Follow-up    for    Atrial Fibrillation    on amiodarone   Coronary Artery Disease   Congestive Heart Failure   Aortic Stenosis    History of Present Illness:    Bernard Kim is a 85 y.o. male with a hx  of paroxysmal atrial fibrillation on amiodarone and anticoagulated hypertensive heart disease coronary artery disease hyperlipidemia congestive heart failure and moderate aortic stenosis mild to moderate aortic regurgitation and enlargement of the ascending aorta last seen April 2020 virtual visit.  Left heart catheterization performed December 2018 showed three-vessel disease with 85% distal right coronary stenosis the other mild to moderate at that time ejection fraction was 20 to 25% due to cardiomyopathy from atrial fibrillation.  His most recent echocardiogram 10/18/2018 shows ejection fraction of 60 to 65% with mild left ventricular dilation mild left atrial enlargement mild mitral regurgitation moderate aortic stenosis P/M 48/26 mmHg and VTI ratio 0.3 AVA 1.0-1.1 cm2 mild to moderate aortic regurgitation mild to moderate enlargement of the ascending aorta.  He has stage III CKD and with age and renal function is on reduced dose apixaban. Compliance with diet, lifestyle and medications: Yes  In general he is done well rarely has episodes of angina and had one this weekend relieved with nitroglycerin.  This is a very rare and infrequent event.  He is not short of breath he has no edema orthopnea or bleeding complication of his anticoagulant.  He is having episodes of lightheadedness his EKG shows sinus bradycardia think he would do well to decrease his amiodarone check levels and reassess his thyroid.  I discussed with the patient and his son that his aortic stenosis likely has progressed we will recheck an echocardiogram to see him sooner in the  office in 3 months. Past Medical History:  Diagnosis Date   Cancer ([place])    bladdre cancer   CHF (congestive heart failure) ([place])    Coronary artery disease    Dyspnea    Hypertension    Stroke [facility])     Past Surgical History:  Procedure Laterality Date   CARDIAC CATHETERIZATION     HERNIA REPAIR     JOINT REPLACEMENT     PROSTATE  SURGERY     RIGHT/LEFT HEART CATH AND CORONARY ANGIOGRAPHY N/A 09/03/2017   Procedure: RIGHT/LEFT HEART CATH AND CORONARY ANGIOGRAPHY;  Surgeon: Jolaine Artist, MD;  Location: [place] CV LAB;  Service: Cardiovascular;  Laterality: N/A;    Current Medications: Current Meds  Medication Sig   acetaminophen (TYLENOL 8 HOUR ARTHRITIS PAIN) 650 MG CR tablet Take 650 mg by mouth every 8 (eight) hours as needed for pain.   amiodarone (PACERONE) 200 MG tablet Take 200 mg by mouth daily.   apixaban (ELIQUIS) 5 MG TABS tablet Take 2.5 mg by mouth 2 (two) times daily.   empagliflozin (JARDIANCE) 25 MG TABS tablet Take 25 mg by mouth daily.   furosemide (LASIX) 80 MG tablet TAKE 1 TABLET ONCE DAILY.   Insulin Glargine (LANTUS) 100 UNIT/ML Solostar Pen Inject 20 Units into the skin daily.    losartan (COZAAR) 25 MG tablet Take 12.5 mg by mouth 2 (two) times daily.    lovastatin (MEVACOR) 40 MG tablet Take 40 mg by mouth at bedtime.    NITROSTAT 0.4 MG SL tablet DISSOLVE 1 TABLET UNDER THE TONGUE EVERY 5 MINUTES AS NEEDED FOR CHEST PAIN.     Allergies:   Spironolactone   Social History   Socioeconomic History   Marital status: Married    Spouse name: Not on file   Number of children: Not on file   Years of education: Not on file   Highest education level: Not on file  Occupational History   Not on file  Social Needs   Financial resource strain: Not very hard   Food insecurity    Worry: Never true    Inability: Never true   Transportation needs    Medical: No    Non-medical: No  Tobacco Use   Smoking status: Never Smoker   Smokeless tobacco: Never Used  Substance and Sexual Activity   Alcohol use: No    Frequency: Never   Drug use: No   Sexual activity: Not on file  Lifestyle   Physical activity    Days per week: 0 days    Minutes per session: 0 min   Stress: Not at all  Relationships   Social connections    Talks on phone: More than three  times a week    Gets together: Never    Attends religious service: More than 4 times per year    Active member of club or organization: No    Attends meetings of clubs or organizations: Never    Relationship status: Married  Other Topics Concern   Not on file  Social History Narrative   Not on file     Family History: The patient's family history includes Heart Problems in his brother; Heart attack in his father. ROS:   Please see the history of present illness.    All other systems reviewed and are negative.  EKGs/Labs/Other Studies Reviewed:    The following studies were reviewed today:  EKG:  EKG ordered today and personally reviewed.  The ekg ordered today  demonstrates shows sinus rhythm small P waves APCs narrow QRS no heart block  Recent Labs: 10/12/2018: NT-Pro BNP 401 11/12/2018: BUN 18; Creatinine, Ser 1.46; Potassium 3.9; Sodium 140   09/20/2018, cholesterol 213 LDL 115 HDL 88 A1c 5.6% creatinine 0.7 TSH 3.29  Physical Exam:    VS:  BP (!) 152/70 (BP Location: Right Arm, Patient Position: Sitting, Cuff Size: Normal)    Pulse (!) 55    Ht 5\' 8"  (1.727 m)    Wt 173 lb (78.5 kg)    SpO2 97%    BMI 26.30 kg/m     Wt Readings from Last 3 Encounters:  06/20/19 173 lb (78.5 kg)  12/28/18 170 lb (77.1 kg)  11/12/18 178 lb (80.7 kg)     GEN: He looks frail well nourished, well developed in no acute distress HEENT: Normal NECK: No JVD; No carotid bruits LYMPHATICS: No lymphadenopathy CARDIAC: He has a very harsh grunting grade 3/6 to 4/6 aortic stenosis murmur encompasses S2 radiates to the carotids.  RRR, no murmurs, rubs, gallops RESPIRATORY:  Clear to auscultation without rales, wheezing or rhonchi  ABDOMEN: Soft, non-tender, non-distended MUSCULOSKELETAL:  No edema; No deformity  SKIN: Warm and dry NEUROLOGIC:  Alert and oriented x 3 PSYCHIATRIC:  Normal affect    Signed, Shirlee More, MD  06/20/2019 12:35 PM    [facility]

## 2019-06-20 ENCOUNTER — Ambulatory Visit (INDEPENDENT_AMBULATORY_CARE_PROVIDER_SITE_OTHER): Payer: Medicare Other | Admitting: Cardiology

## 2019-06-20 ENCOUNTER — Encounter: Payer: Self-pay | Admitting: Cardiology

## 2019-06-20 ENCOUNTER — Other Ambulatory Visit: Payer: Self-pay

## 2019-06-20 VITALS — BP 152/70 | HR 55 | Ht 68.0 in | Wt 173.0 lb

## 2019-06-20 DIAGNOSIS — I351 Nonrheumatic aortic (valve) insufficiency: Secondary | ICD-10-CM

## 2019-06-20 DIAGNOSIS — I25119 Atherosclerotic heart disease of native coronary artery with unspecified angina pectoris: Secondary | ICD-10-CM | POA: Diagnosis not present

## 2019-06-20 DIAGNOSIS — I480 Paroxysmal atrial fibrillation: Secondary | ICD-10-CM

## 2019-06-20 DIAGNOSIS — Z9229 Personal history of other drug therapy: Secondary | ICD-10-CM | POA: Diagnosis not present

## 2019-06-20 DIAGNOSIS — E782 Mixed hyperlipidemia: Secondary | ICD-10-CM

## 2019-06-20 DIAGNOSIS — Z79899 Other long term (current) drug therapy: Secondary | ICD-10-CM | POA: Insufficient documentation

## 2019-06-20 DIAGNOSIS — I110 Hypertensive heart disease with heart failure: Secondary | ICD-10-CM

## 2019-06-20 DIAGNOSIS — Z7901 Long term (current) use of anticoagulants: Secondary | ICD-10-CM

## 2019-06-20 DIAGNOSIS — N1832 Chronic kidney disease, stage 3b: Secondary | ICD-10-CM

## 2019-06-20 DIAGNOSIS — I350 Nonrheumatic aortic (valve) stenosis: Secondary | ICD-10-CM

## 2019-06-20 HISTORY — DX: Other long term (current) drug therapy: Z79.899

## 2019-06-20 MED ORDER — NITROGLYCERIN 0.4 MG SL SUBL: SUBLINGUAL_TABLET | SUBLINGUAL | 3 refills | Status: DC

## 2019-06-20 MED ORDER — AMIODARONE HCL 200 MG PO TABS: 200.0000 mg | ORAL_TABLET | Freq: Every day | ORAL | Status: DC

## 2019-06-20 NOTE — Patient Instructions (Signed)
Medication Instructions:  Your physician has recommended you make the following change in your medication:    DECREASE amiodarone 200 mg: Take 1 tablet daily   If you need a refill on your cardiac medications before your next appointment, please call your pharmacy.   Lab work: Your physician recommends that you return for lab work today: amiodarone level, CMP, lipid panel, TSH, CBC.   If you have labs (blood work) drawn today and your tests are completely normal, you will receive your results only by: Marland Kitchen MyChart Message (if you have MyChart) OR . A paper copy in the mail If you have any lab test that is abnormal or we need to change your treatment, we will call you to review the results.  Testing/Procedures: You had an EKG today.   Your physician has requested that you have an echocardiogram. Echocardiography is a painless test that uses sound waves to create images of your heart. It provides your doctor with information about the size and shape of your heart and how well your heart's chambers and valves are working. This procedure takes approximately one hour. There are no restrictions for this procedure.  A chest x-ray takes a picture of the organs and structures inside the chest, including the heart, lungs, and blood vessels. This test can show several things, including, whether the heart is enlarges; whether fluid is building up in the lungs; and whether pacemaker / defibrillator leads are still in place.  Follow-Up: At [facility], you and your health needs are our priority.  As part of our continuing mission to provide you with exceptional heart care, we have created designated Provider Care Teams.  These Care Teams include your primary Cardiologist (physician) and Advanced Practice Providers (APPs -  Physician Assistants and Nurse Practitioners) who all work together to provide you with the care you need, when you need it. You will need a follow up appointment in 3 months.

## 2019-06-21 ENCOUNTER — Ambulatory Visit: Payer: Medicare Other | Admitting: Cardiology

## 2019-06-21 NOTE — Addendum Note (Signed)
Addended by: Anselm Pancoast on: 06/21/2019 08:39 AM   Modules accepted: Orders

## 2019-06-22 ENCOUNTER — Other Ambulatory Visit: Payer: Self-pay

## 2019-06-22 ENCOUNTER — Ambulatory Visit (INDEPENDENT_AMBULATORY_CARE_PROVIDER_SITE_OTHER): Payer: Medicare Other

## 2019-06-22 ENCOUNTER — Telehealth: Payer: Self-pay

## 2019-06-22 DIAGNOSIS — I480 Paroxysmal atrial fibrillation: Secondary | ICD-10-CM | POA: Diagnosis not present

## 2019-06-22 DIAGNOSIS — I110 Hypertensive heart disease with heart failure: Secondary | ICD-10-CM

## 2019-06-22 DIAGNOSIS — I350 Nonrheumatic aortic (valve) stenosis: Secondary | ICD-10-CM

## 2019-06-22 DIAGNOSIS — I25119 Atherosclerotic heart disease of native coronary artery with unspecified angina pectoris: Secondary | ICD-10-CM

## 2019-06-22 DIAGNOSIS — E782 Mixed hyperlipidemia: Secondary | ICD-10-CM

## 2019-06-22 DIAGNOSIS — I351 Nonrheumatic aortic (valve) insufficiency: Secondary | ICD-10-CM

## 2019-06-22 DIAGNOSIS — Z79899 Other long term (current) drug therapy: Secondary | ICD-10-CM

## 2019-06-22 DIAGNOSIS — Z7901 Long term (current) use of anticoagulants: Secondary | ICD-10-CM | POA: Diagnosis not present

## 2019-06-22 DIAGNOSIS — N1832 Chronic kidney disease, stage 3b: Secondary | ICD-10-CM

## 2019-06-22 LAB — AMIODARONE LEVEL
Amiodarone, Serum: 1.6 ug/mL (ref 1.0–2.5)
Noramiodarone,S: 1.2 ug/mL (ref 1.0–2.5)

## 2019-06-22 LAB — COMPREHENSIVE METABOLIC PANEL
ALT: 30 IU/L (ref 0–44)
AST: 31 IU/L (ref 0–40)
Albumin/Globulin Ratio: 1.7 (ref 1.2–2.2)
Albumin: 4.2 g/dL (ref 3.6–4.6)
Alkaline Phosphatase: 106 IU/L (ref 39–117)
BUN/Creatinine Ratio: 11 (ref 10–24)
BUN: 14 mg/dL (ref 8–27)
Bilirubin Total: 0.6 mg/dL (ref 0.0–1.2)
CO2: 23 mmol/L (ref 20–29)
Calcium: 8.6 mg/dL (ref 8.6–10.2)
Chloride: 104 mmol/L (ref 96–106)
Creatinine, Ser: 1.27 mg/dL (ref 0.76–1.27)
GFR calc Af Amer: 59 mL/min/{1.73_m2} — ABNORMAL LOW (ref >59)
GFR calc non Af Amer: 51 mL/min/{1.73_m2} — ABNORMAL LOW (ref >59)
Globulin, Total: 2.5 g/dL (ref 1.5–4.5)
Glucose: 159 mg/dL — ABNORMAL HIGH (ref 65–99)
Potassium: 4.4 mmol/L (ref 3.5–5.2)
Sodium: 143 mmol/L (ref 134–144)
Total Protein: 6.7 g/dL (ref 6.0–8.5)

## 2019-06-22 LAB — CBC
Hematocrit: 47.6 % (ref 37.5–51.0)
Hemoglobin: 15.7 g/dL (ref 13.0–17.7)
MCH: 30.9 pg (ref 26.6–33.0)
MCHC: 33 g/dL (ref 31.5–35.7)
MCV: 94 fL (ref 79–97)
Platelets: 168 10*3/uL (ref 150–450)
RBC: 5.08 x10E6/uL (ref 4.14–5.80)
RDW: 12.9 % (ref 11.6–15.4)
WBC: 7.3 10*3/uL (ref 3.4–10.8)

## 2019-06-22 LAB — LIPID PANEL
Chol/HDL Ratio: 3.8 ratio (ref 0.0–5.0)
Cholesterol, Total: 193 mg/dL (ref 100–199)
HDL: 51 mg/dL (ref >39)
LDL Chol Calc (NIH): 120 mg/dL — ABNORMAL HIGH (ref 0–99)
Triglycerides: 124 mg/dL (ref 0–149)
VLDL Cholesterol Cal: 22 mg/dL (ref 5–40)

## 2019-06-22 LAB — TSH: TSH: 1.57 u[IU]/mL (ref 0.450–4.500)

## 2019-06-22 MED ORDER — EZETIMIBE 10 MG PO TABS: 10.0000 mg | ORAL_TABLET | Freq: Every day | ORAL | 3 refills | Status: DC

## 2019-06-22 NOTE — Telephone Encounter (Signed)
Patient notified of lab results with elevated LDL per Dr Bettina Gavia.  Patient advised to start Zetia 10mg  one tablet daily and continue taking Lovastatin.  Rx sent to pharmacy.  Patient agreed to plan and verbalized understanding.

## 2019-06-22 NOTE — Telephone Encounter (Signed)
-----   Message from Richardo Priest, MD sent at 06/22/2019  4:44 PM EDT ----- Normal or stable result  I am concerned about his LDL above target and like him to also take Zetia along with a statin 10 mg daily

## 2019-06-22 NOTE — Progress Notes (Addendum)
Complete echocardiogram has been performed.  Jimmy Roran Wegner RDCS, RVT 

## 2019-06-24 ENCOUNTER — Other Ambulatory Visit: Payer: Self-pay | Admitting: *Deleted

## 2019-06-24 ENCOUNTER — Encounter: Payer: Self-pay | Admitting: *Deleted

## 2019-06-24 ENCOUNTER — Other Ambulatory Visit: Payer: Self-pay | Admitting: Cardiology

## 2019-06-24 NOTE — Telephone Encounter (Signed)
Rx refill sent to pharmacy. 

## 2019-06-24 NOTE — Patient Outreach (Addendum)
[place] [facility]) Care Management  [place]  06/24/2019   MYRL LAZARUS [birth year] [id number]    [place] Quarterly Outreach   Referral Date:  11/01/2018 Referral Source:  MD Office Reason for Referral:  Disease Management Education Insurance:  [place] Shield Medicare   Outreach Attempt:  Successful telephone outreach to patient.  HIPAA verified with patient.  Patient reporting episode of chest pain relieved with 2 SL NTG last week.  Attended appointment with cardiologist this week and had testing done.  Denies any chest pain since episode last week and reports light headiness is better.  Patient reporting his gait and steadiness is slightly off,  Denies any falls.  Fall precautions and preventions reviewed and discussed.  Encouraged to use cane or walker with all ambulation to help prevent falls.  Also states he has shortness of breath with ambulation up stairs.  Encouraged patient to contact primary care provider or cardiologist if shortness of breath does not get better with adjustment of amiodarone or gets worse.  Reports he has not monitored his blood sugars in about a week due to having new CBG meter with new testing supplies and lancets not puncturing his skin.  Stated he was planning to take meter and supplies with him to his appointment at the end of the month.  Encouraged patient to take meter and supplies to his pharmacy for assistance in operating machine and materials.  Reports prior fasting morning ranges were 90-110's and afternoon/evening blood sugars ranged 170-180's after eating snacks prior to dinner.  Reports drinking more sprites instead of cokes.  Educated patient on sugar contact of both drinks.  Encouraged diabetic food and drink options.  Encounter Medications:  Outpatient Encounter Medications as of 06/24/2019  Medication Sig Note  . acetaminophen (TYLENOL 8 HOUR ARTHRITIS PAIN) 650 MG CR tablet Take 650 mg by mouth every 8 (eight)  hours as needed for pain.   Marland Kitchen amiodarone (PACERONE) 200 MG tablet Take 1 tablet (200 mg total) by mouth daily.   Marland Kitchen apixaban (ELIQUIS) 5 MG TABS tablet Take 2.5 mg by mouth 2 (two) times daily.   . empagliflozin (JARDIANCE) 25 MG TABS tablet Take 25 mg by mouth daily.   Marland Kitchen ezetimibe (ZETIA) 10 MG tablet Take 1 tablet (10 mg total) by mouth daily.   . Insulin Glargine (LANTUS) 100 UNIT/ML Solostar Pen Inject 20 Units into the skin daily.  06/24/2019: Reports taking Basaglar 20 Units daily  . losartan (COZAAR) 25 MG tablet Take 12.5 mg by mouth 2 (two) times daily.    Marland Kitchen lovastatin (MEVACOR) 40 MG tablet Take 40 mg by mouth at bedtime.    . nitroGLYCERIN (NITROSTAT) 0.4 MG SL tablet DISSOLVE 1 TABLET UNDER THE TONGUE EVERY 5 MINUTES AS NEEDED FOR CHEST PAIN.   . [DISCONTINUED] furosemide (LASIX) 80 MG tablet TAKE 1 TABLET ONCE DAILY.    No facility-administered encounter medications on file as of 06/24/2019.     Functional Status:  In your present state of health, do you have any difficulty performing the following activities: 12/10/2018  Hearing? Y  Comment bilateral hearing aids  Vision? N  Difficulty concentrating or making decisions? N  Walking or climbing stairs? N  Dressing or bathing? N  Doing errands, shopping? N  Preparing Food and eating ? N  Using the Toilet? N  In the past six months, have you accidently leaked urine? N  Do you have problems with loss of bowel control? N  Managing  your Medications? N  Managing your Finances? N  Housekeeping or managing your Housekeeping? N  Some recent data might be hidden    Fall/Depression Screening: Fall Risk  06/24/2019 04/21/2019 02/01/2019  Falls in the past year? 0 0 0  Number falls in past yr: - - -  Injury with Fall? - - -  Risk for fall due to : Medication side effect;Impaired balance/gait;Impaired mobility Medication side effect;Impaired vision Impaired vision;Medication side effect  Follow up Falls prevention discussed;Education  provided;Falls evaluation completed Falls evaluation completed;Education provided;Falls prevention discussed Falls evaluation completed;Falls prevention discussed;Education provided   PHQ 2/9 Scores 12/10/2018 11/01/2018  PHQ - 2 Score 0 0    THN CM Care Plan Problem One     Most Recent Value  Care Plan Problem One  Knowledge deficiet related to self care management of diabetes  Role Documenting the Problem One  [place] for Problem One  Active  [facility] Long Term Goal   Patient will report a decrease in Hgb A1C by 0.3 points in the next 90 days.  THN Long Term Goal Start Date  04/21/19  Interventions for Problem One Long Term Goal  Discussed and reviewed care plan and goals, reviewed medications and encouraged medication compliance, discussed limiting intake of all sodas (light and dark), reviewed diabetic diet and discussed healthier food and drink choice options, encouraged to keep and attend scheduled medical appointments, reviewed how to use NTG tablets and when to call 911 versus contacting cardiologist, encouraged patient to monitor shortness of breath and to notify cardiologist or primary care provider if it gets worse, discussed current A1C and ways to help reduce, fall precautions and preventions reviewed and discussed, encouraged use of cane or walker with all ambulation, encouraged patient to contact [facility] Pharmacist concerning medication assistance for Eliquis  THN CM Short Term Goal #1   Patient will report his CBG meter will be operating correctly within the next 30 days.  THN CM Short Term Goal #1 Start Date  06/24/19  [facility] CM Short Term Goal #1 Met Date  06/24/19  Interventions for Short Term Goal #1  Discussed patient recieving new meter and testing supplies and how patient cannot get lancets to punture his skin for blood supply, encouraged patient to take meter and testing supplies to pharmacy to have pharmacy assist patient in operating machine, if pharmacy not able to  assist encouraged patient to take meter and supplies to PCP office for assistance, discussed importance of monitoring blood sugars      Appointments:  Patient reports having appointment with primary care provider, Dr. Nyra Capes on 07/15/2019 for lab work and visit on 07/23/2019.  Attended Cardiology appointment on 06/20/2019.  Plan: RN Health Coach will send primary care provider quarterly update. RN Health Coach will make next telephone outreach to patient within the month of November.  [place] [phone] [email or web address]

## 2019-06-27 ENCOUNTER — Other Ambulatory Visit: Payer: Self-pay

## 2019-06-27 ENCOUNTER — Other Ambulatory Visit: Payer: Self-pay | Admitting: Cardiology

## 2019-06-27 NOTE — Patient Outreach (Signed)
[place] [facility]) Care Management  06/27/2019  TAELOR FRAGOZA 31-Aug-1931 [id number]   Medication Adherence call to Mr. Ziere Kuti Hippa Identifiers Verify spoke with patient he is past due on Losartan 25 mg patient explain he is taking this medication and  his daughter order the medication and will place an order thru the pharmacy. Mr. Pinkus is showing past due under [place].   [place] Management Direct Dial [phone]  Fax [phone] [email or web address]

## 2019-07-01 ENCOUNTER — Ambulatory Visit: Payer: Medicare Other | Admitting: Cardiology

## 2019-07-12 ENCOUNTER — Ambulatory Visit: Payer: Medicare Other | Admitting: Cardiology

## 2019-07-15 DIAGNOSIS — E1169 Type 2 diabetes mellitus with other specified complication: Secondary | ICD-10-CM | POA: Diagnosis not present

## 2019-07-15 DIAGNOSIS — E782 Mixed hyperlipidemia: Secondary | ICD-10-CM | POA: Diagnosis not present

## 2019-07-15 DIAGNOSIS — N182 Chronic kidney disease, stage 2 (mild): Secondary | ICD-10-CM | POA: Diagnosis not present

## 2019-07-15 DIAGNOSIS — I129 Hypertensive chronic kidney disease with stage 1 through stage 4 chronic kidney disease, or unspecified chronic kidney disease: Secondary | ICD-10-CM | POA: Diagnosis not present

## 2019-07-25 DIAGNOSIS — E782 Mixed hyperlipidemia: Secondary | ICD-10-CM | POA: Diagnosis not present

## 2019-07-25 DIAGNOSIS — E1169 Type 2 diabetes mellitus with other specified complication: Secondary | ICD-10-CM | POA: Diagnosis not present

## 2019-07-25 DIAGNOSIS — I129 Hypertensive chronic kidney disease with stage 1 through stage 4 chronic kidney disease, or unspecified chronic kidney disease: Secondary | ICD-10-CM | POA: Diagnosis not present

## 2019-07-25 DIAGNOSIS — N182 Chronic kidney disease, stage 2 (mild): Secondary | ICD-10-CM | POA: Diagnosis not present

## 2019-07-29 ENCOUNTER — Other Ambulatory Visit: Payer: Self-pay

## 2019-07-29 NOTE — Patient Outreach (Signed)
[place] [facility]) Care Management  07/29/2019  Bernard Kim Mar 16, 1931 [id number]   Medication Adherence call to Bernard Kim Hippa Identifiers Verify spoke with patient he is showing past due on Lovastatin 40 mg,patient explain he takes 1 tablet daily,patient has a pill box he fills every three weeks,patient has a few,he ask if we can call [facility] to order this medication,pharmacy will have ready for patient to pick up,patient also ask for some paperwork he need it to send to [facility] patient ask for her phone number and will be getting in touch with her.,Bernard Kim is showing past due under [place].  [place] Management Direct Dial [phone]  Fax [phone] [email or web address]

## 2019-08-01 ENCOUNTER — Other Ambulatory Visit: Payer: Self-pay | Admitting: Pharmacist

## 2019-08-01 ENCOUNTER — Other Ambulatory Visit: Payer: Self-pay | Admitting: *Deleted

## 2019-08-01 ENCOUNTER — Encounter: Payer: Self-pay | Admitting: *Deleted

## 2019-08-01 NOTE — Patient Outreach (Signed)
[place] [facility]) Care Management  08/01/2019  ANGELICA STABLEIN 1930/11/11 [id number]   RN Health Coach Quarterly Outreach  Referral Date:11/01/2018 Referral Source:MD Office Reason for Referral:Disease Management Education Insurance:[employer] Shield Medicare   Outreach Attempt:  Successful telephone outreach to patient for follow up.  HIPAA verified with patient.  Patient stating he is doing well.  Reports about 1 or 2 episodes of chest pain since last outreach that "wasn't bad".  Denies any shortness of breath at this time and states dizziness is better.  Denies any recent falls.  Patient states he is monitoring his blood sugars about 4 times a week.  Has not checked blood sugar today.  Last check was Saturday with a fasting glucose of 109 and recent fasting ranges below 120.  Verbalizes he knows how to use his new CBG meter now.  Appointments:  Patient reports having a scheduled appointment with primary care provider, Dr. Nyra Capes in January.  Cancelled his October appointments with Cardiology.  Encouraged patient to reschedule Cardiology appointments cancelled back in October.  Plan: RN Health Coach will make next telephone outreach to patient with in the month of January.  [place] [phone] [email or web address]

## 2019-08-01 NOTE — Patient Outreach (Signed)
[place] [facility]) Care Management  [place] 08/01/2019  THELMON REINHOLTZ 04-03-1931 [id number]  Incoming call and voicemail received from patient's daughter, [name], regarding patient and spouse being eligible for Eliquis patient assistance program.    Return call placed to family today.  Patient and spouse report he has spent additional TROOP to now qualify for BMS program 3% TROOP requirement.  Patient will mail documents to [facility].  Address provided to patient.   Patient requests that I speak with his spouse regarding starting an application as well.  Will document this separately in an encounter with spouse.    Plan: Will await receipt of TROOP documents and will submit to BMS when received.   [name], PharmD, [place] [phone]

## 2019-08-04 ENCOUNTER — Other Ambulatory Visit: Payer: Self-pay | Admitting: Pharmacy Technician

## 2019-08-04 NOTE — Patient Outreach (Signed)
[place] [facility]) Care Management  08/04/2019  DONIE ESTIS July 05, 1931 [id number]   Received patient portion(s) of additional out of pocket spend for Eliquis. Faxed documents into [employer].  Will follow up with company(ies) in 3-5 business days to check status of application(s).  Maud Deed Chana Bode [place] Certified Pharmacy Technician [place] Management Direct Dial:[phone]

## 2019-08-09 ENCOUNTER — Other Ambulatory Visit: Payer: Self-pay | Admitting: Pharmacy Technician

## 2019-08-09 NOTE — Patient Outreach (Signed)
[place] [facility]) Care Management  08/09/2019  Bernard Kim [birth year] [id number]    Follow up call placed to [place] regarding patient assistance application(s) for Eliquis , [name] confirms patient has been approved as of 11/20 until 09/15/19. 90 day supply of medication to be delivered to patients home via [place] on 08/10/19.  Follow up:  Will follow up with patient in 3-4 business days to confirm medication has been received   Maud Deed. Chana Bode [place] Certified Pharmacy Technician [place] Management Direct Dial:[phone]

## 2019-08-15 DIAGNOSIS — E782 Mixed hyperlipidemia: Secondary | ICD-10-CM | POA: Diagnosis not present

## 2019-08-15 DIAGNOSIS — N182 Chronic kidney disease, stage 2 (mild): Secondary | ICD-10-CM | POA: Diagnosis not present

## 2019-08-15 DIAGNOSIS — I129 Hypertensive chronic kidney disease with stage 1 through stage 4 chronic kidney disease, or unspecified chronic kidney disease: Secondary | ICD-10-CM | POA: Diagnosis not present

## 2019-08-15 DIAGNOSIS — E1169 Type 2 diabetes mellitus with other specified complication: Secondary | ICD-10-CM | POA: Diagnosis not present

## 2019-08-22 DIAGNOSIS — L309 Dermatitis, unspecified: Secondary | ICD-10-CM | POA: Diagnosis not present

## 2019-08-26 DIAGNOSIS — E1165 Type 2 diabetes mellitus with hyperglycemia: Secondary | ICD-10-CM | POA: Diagnosis not present

## 2019-09-01 ENCOUNTER — Other Ambulatory Visit: Payer: Self-pay | Admitting: Pharmacy Technician

## 2019-09-01 NOTE — Patient Outreach (Signed)
[place] [facility]) Care Management  09/01/2019  ZEUS PROPP January 11, 1931 [id number]    Successful call placed to patient regarding patient assistance medication delivery of Eliquis, HIPAA identifiers verified. Mr. Ansari confirms that he received his 90 day supply of Eliquis from [place] patient assistance.  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   Follow up:  Will route note to [place] for medication review.  Maud Deed Chana Bode [place] Certified Pharmacy Technician [place] Management Direct Dial:[phone]

## 2019-09-02 ENCOUNTER — Other Ambulatory Visit: Payer: Self-pay | Admitting: Pharmacist

## 2019-09-02 NOTE — Patient Outreach (Signed)
[place] [facility]) Care Management  [place] 09/02/2019  Bernard Kim 23-Dec-1930 [id number]  Patient has been approved for Eliquis patient assistance program and confirmed receipt of medication.  He was also approved earlier this year for Basaglar and Vania Rea and is eligible to renew applications for [id number] as there is no TROOP for these patient assistance programs.   Plan: I will route patient assistance letter to [place] technician who will coordinate patient assistance program application process for medications listed above.  [facility] pharmacy technician will assist with obtaining all required documents from both patient and provider(s) and submit application(s) once completed.    [name], PharmD, [place] [phone]

## 2019-09-05 ENCOUNTER — Other Ambulatory Visit: Payer: Self-pay | Admitting: Pharmacy Technician

## 2019-09-05 NOTE — Patient Outreach (Signed)
[place] [facility]) Care Management  09/05/2019  Bernard Kim [birth year] [id number]                                       Medication Assistance Referral  Referral From: [place]  Medication/Company: Vania Rea / [employer] Patient application portion:  Mailed Provider application portion: Faxed  to Dr. Presley Raddle Provider address/fax verified via: Office website  Medication/Company: Nancee Liter / Ralph [place] [employer] Patient application portion:  Mailed Provider application portion: Faxed  to Dr. Presley Raddle Provider address/fax verified via: Office website   Follow up:  Will follow up with patient in 10-14 business days to confirm application(s) have been received.  Maud Deed Chana Bode [place] Certified Pharmacy Technician [place] Management Direct [phone]

## 2019-09-14 DIAGNOSIS — E1169 Type 2 diabetes mellitus with other specified complication: Secondary | ICD-10-CM | POA: Diagnosis not present

## 2019-09-14 DIAGNOSIS — N182 Chronic kidney disease, stage 2 (mild): Secondary | ICD-10-CM | POA: Diagnosis not present

## 2019-09-14 DIAGNOSIS — E782 Mixed hyperlipidemia: Secondary | ICD-10-CM | POA: Diagnosis not present

## 2019-09-14 DIAGNOSIS — I129 Hypertensive chronic kidney disease with stage 1 through stage 4 chronic kidney disease, or unspecified chronic kidney disease: Secondary | ICD-10-CM | POA: Diagnosis not present

## 2019-09-19 DIAGNOSIS — C672 Malignant neoplasm of lateral wall of bladder: Secondary | ICD-10-CM | POA: Diagnosis not present

## 2019-09-20 ENCOUNTER — Other Ambulatory Visit: Payer: Self-pay | Admitting: Pharmacy Technician

## 2019-09-20 NOTE — Patient Outreach (Signed)
[place] [facility]) Care Management  09/20/2019  KAMARII MONIGOLD 1930/11/27 [id number]   Received patient portion(s) of patient assistance application(s) for [employer] and Jardiance. Faxed completed application and required documents into [employer] and [employer]  Will follow up with company(ies) in 10-14 business days to check status of application(s).  Maud Deed Chana Bode [place] Certified Pharmacy Technician [place] Management Direct Dial:[phone]

## 2019-09-23 ENCOUNTER — Encounter: Payer: Self-pay | Admitting: *Deleted

## 2019-09-23 ENCOUNTER — Other Ambulatory Visit: Payer: Self-pay | Admitting: *Deleted

## 2019-09-23 NOTE — Patient Outreach (Signed)
[place] [facility]) Care Management  [facility] Care Manager  09/23/2019   Bernard Kim 10/11/1930 [id number]   [place] Quarterly Outreach   Referral Date:  11/01/2018 Referral Source:  MD Office Reason for Referral:  Disease Management Education Insurance:  [name]   Outreach Attempt:  Successful telephone outreach to patient for follow up.  Patient reporting he is doing well.  Denies any chest pains or shortness of breath or lower extremity edema.  Weighs himself several times a week.  Weight this morning was 164 pounds (normal 164-167).  Continues to monitor blood sugars several times a week.  Fasting blood sugar this morning was 108 with fasting ranges <115.  Denies any episodes of hypo or hyperglycemia.  Denies any recent falls or any sick days.  Encounter Medications:  Outpatient Encounter Medications as of 09/23/2019  Medication Sig Note  . amiodarone (PACERONE) 200 MG tablet Take 1 tablet (200 mg total) by mouth daily.   Marland Kitchen apixaban (ELIQUIS) 5 MG TABS tablet Take 2.5 mg by mouth 2 (two) times daily.   . empagliflozin (JARDIANCE) 25 MG TABS tablet Take 25 mg by mouth daily.   . furosemide (LASIX) 80 MG tablet TAKE 1 TABLET ONCE DAILY.   Marland Kitchen Insulin Glargine (BASAGLAR KWIKPEN [place]) Inject 20 Units into the skin daily.   Marland Kitchen losartan (COZAAR) 25 MG tablet TAKE 0.5 TABLET BY MOUTH DAILY   . lovastatin (MEVACOR) 40 MG tablet Take 40 mg by mouth at bedtime.    Marland Kitchen acetaminophen (TYLENOL 8 HOUR ARTHRITIS PAIN) 650 MG CR tablet Take 650 mg by mouth every 8 (eight) hours as needed for pain.   Marland Kitchen ezetimibe (ZETIA) 10 MG tablet Take 1 tablet (10 mg total) by mouth daily. 09/23/2019: Continues to take  . nitroGLYCERIN (NITROSTAT) 0.4 MG SL tablet DISSOLVE 1 TABLET UNDER THE TONGUE EVERY 5 MINUTES AS NEEDED FOR CHEST PAIN.    No facility-administered encounter medications on file as of 09/23/2019.    Functional Status:  In your present state of health, do you have  any difficulty performing the following activities: 09/23/2019 12/10/2018  Hearing? Y Y  Comment bilateral hearing aids bilateral hearing aids  Vision? N N  Difficulty concentrating or making decisions? N N  Walking or climbing stairs? N N  Dressing or bathing? N N  Doing errands, shopping? N N  Preparing Food and eating ? N N  Using the Toilet? N N  In the past six months, have you accidently leaked urine? N N  Do you have problems with loss of bowel control? N N  Managing your Medications? N N  Managing your Finances? N N  Housekeeping or managing your Housekeeping? N N  Some recent data might be hidden    Fall/Depression Screening: Fall Risk  09/23/2019 08/01/2019 06/24/2019  Falls in the past year? 0 0 0  Number falls in past yr: - - -  Injury with Fall? - - -  Risk for fall due to : Medication side effect;Impaired balance/gait;Impaired mobility;Impaired vision Medication side effect;Impaired mobility;Impaired balance/gait Medication side effect;Impaired balance/gait;Impaired mobility  Follow up Falls evaluation completed;Education provided;Falls prevention discussed Falls evaluation completed;Education provided;Falls prevention discussed Falls prevention discussed;Education provided;Falls evaluation completed   PHQ 2/9 Scores 12/10/2018 11/01/2018  PHQ - 2 Score 0 0   THN CM Care Plan Problem One     Most Recent Value  Care Plan Problem One  Knowledge deficiet related to self care management of diabetes  Role  Documenting the Problem One  [place] for Problem One  Active  [facility] Long Term Goal   Patient will report a decrease in Hgb A1C by 0.3 points in the next 90 days.  THN Long Term Goal Start Date  09/23/19  Interventions for Problem One Long Term Goal  Care plan and goals reviewed and discussed, discussed increase in Hgb A1C and discussed ways to reduce, reviewed current fasting glucose ranges and encouraged patient to continue to monitor and notify provider for  sustained elevations, encouraged to weigh daily, reviewed signs and symptoms of heart failure, encouraged to keep and attend scheduled medical appointments, reviewed medications and encouraged medication compliance     Appointments:  Attended appointment with primary care provider, Dr. Nyra Capes on 08/22/2019 and has scheduled follow up in March 2021.  Plan: RN Health Coach will send primary care provider quarterly update. RN Health Coach will make next telephone outreach to patient within the month of April and patient agrees to future outreach.  [place] [phone] [name]

## 2019-09-24 ENCOUNTER — Other Ambulatory Visit: Payer: Self-pay | Admitting: Cardiology

## 2019-09-29 ENCOUNTER — Other Ambulatory Visit: Payer: Self-pay | Admitting: Pharmacy Technician

## 2019-09-29 NOTE — Patient Outreach (Signed)
[place] [facility]) Care Management  09/29/2019  Bernard Kim 20-Nov-1930 [id number]    Follow up call placed to [facility] regarding patient assistance application(s) for Bernard Kim Seth Bake confirms patient has been approved as of 1/9 until 09/14/20. [facility] pharmacy to outreach patient to set up shipping.  Follow up:  Will follow up with patient in 3-5 business days to confirm shipping has been set up  [employer]. Chana Bode [place] Certified Pharmacy Technician [place] Management Direct [phone]

## 2019-10-11 NOTE — H&P (View-Only) (Signed)
Cardiology Office Note:    Date:  10/12/2019   ID:  Bernard Kim, DOB [birth year], MRN [id number]  PCP:  Marco Collie, MD  Cardiologist:  Shirlee More, MD    Referring MD: Marco Collie, MD    ASSESSMENT:    1. Paroxysmal A-fib ([place])   2. On amiodarone therapy   3. Chronic anticoagulation   4. Hypertensive heart disease with heart failure ([place])   5. Coronary artery disease involving native coronary artery of native heart with angina pectoris ([place])   6. Aortic valve stenosis, etiology of cardiac valve disease unspecified   7. Stage 3b chronic kidney disease   8. Mixed hyperlipidemia    PLAN:    In order of problems listed above:  1. Stable 2. Continue amiodarone 3. Continue renal dose reduced anticoagulant 4. Stable heart failure no volume overload and continue his diuretic 5. Stable CAD having no anginal discomfort. 6. Clinically he has severe low-flow aortic stenosis I think he still is a good candidate for TAVR although he has  deteriorated and will be referred for left and right heart catheterization 7. Check labs today 8. Continue his low intensity statin. 9. Stable diabetes he takes SGLT2 inhibitor with his heart disease   Next appointment: Will be scheduled after his cardiac cath and TAVR if indicated   Medication Adjustments/Labs and Tests Ordered: Current medicines are reviewed at length with the patient today.  Concerns regarding medicines are outlined above.  No orders of the defined types were placed in this encounter.  No orders of the defined types were placed in this encounter.   Chief Complaint  Patient presents with  . Atrial Fibrillation    3 month follow up  . Congestive Heart Failure  . Aortic Stenosis  . Coronary Artery Disease  . Hypertension  . Hyperlipidemia  . Anticoagulation    History of Present Illness:    Bernard Kim is a 86 y.o. male with a hx of paroxysmal atrial fibrillation on amiodarone and anticoagulated hypertensive heart  disease coronary artery disease hyperlipidemia congestive heart failure and moderate aortic stenosis mild to moderate aortic regurgitation and enlargement of the ascending aorta last seen 06/20/2019.Left heart catheterization performed December 2018 showed three-vessel disease with 85% distal right coronary stenosis the other mild to moderate at that time ejection fraction was 20 to 25% due to cardiomyopathy from atrial fibrillation.  His most recent echocardiogram 10/18/2018 shows ejection fraction of 60 to 65% with mild left ventricular dilation mild left atrial enlargement mild mitral regurgitation moderate aortic stenosis P/M 48/26 mmHg and VTI ratio 0.3 AVA 1.0-1.1 cm2 mild to moderate aortic regurgitation mild to moderate enlargement of the ascending aorta.     Compliance with diet, lifestyle and medications: Yes  I reviewed the echocardiogram with the patient and his daughter.  Fortunately she is present as this became a very complex visit with both parents having deterioration.  Bernard Kim still cares for himself dozing during personal hygiene can climb a flight of stairs but clearly has deteriorated has marked exercise intolerance some exertional shortness of breath and had a syncopal episode the other evening.  He had a laceration on his scalp but did not seek medical attention.  He is not having angina.  His physical examination is consistent with severe stenosis review of his echocardiogram in my opinion he has low flow severe aortic stenosis symptomatic and at this time remains a candidate for TAVR.  I am concerned by his deterioration I have asked the  family that we move quickly.  He does have renal insufficiency.  Heart failure is compensated.  He is in sinus rhythm.  Echo 06/22/2019:  1. Left ventricular ejection fraction, by visual estimation, is 60 to 65%. The left ventricle has normal function. Normal left ventricular size. There is moderately increased left ventricular hypertrophy.  2. Left  ventricular diastolic Doppler parameters are consistent with impaired relaxation pattern of LV diastolic filling.  3. Global right ventricle has normal systolic function.The right ventricular size is normal. No increase in right ventricular wall thickness.  4. Left atrial size was mild-moderately dilated.  5. Right atrial size was normal.  6. The mitral valve is normal in structure. Mild mitral valve regurgitation. No evidence of mitral stenosis.  7. The tricuspid valve is normal in structure. Tricuspid valve regurgitation was not visualized by color flow Doppler.  8. The aortic valve is normal in structure. Aortic valve regurgitation is mild by color flow Doppler. Moderate to severe aortic valve stenosis.  AORTIC VALVE AV Area (Vmax):    0.73 cm AV Area (Vmean):   0.73 cm AV Area (VTI):     0.67 cm AV Vmax:           357.00 cm/s AV Vmean:          259.000 cm/s AV VTI:            0.898 m AV Peak Grad:      51.0 mmHg AV Mean Grad:      30.0 mmHg LVOT Vmax:         82.80 cm/s LVOT Vmean:        59.900 cm/s LVOT VTI:          0.191 m LVOT/AV VTI ratio: 0.21   9. The pulmonic valve was normal in structure. Pulmonic valve regurgitation is not visualized by color flow Doppler. 10. Normal pulmonary artery systolic pressure. 11. The inferior vena cava is normal in size with greater than 50% respiratory variability, suggesting right atrial pressure of 3 mmHg. Past Medical History:  Diagnosis Date  . Cancer ([place])    bladdre cancer  . CHF (congestive heart failure) ([place])   . Coronary artery disease   . Dyspnea   . Hypertension   . Stroke [facility])     Past Surgical History:  Procedure Laterality Date  . CARDIAC CATHETERIZATION    . HERNIA REPAIR    . JOINT REPLACEMENT    . PROSTATE SURGERY    . RIGHT/LEFT HEART CATH AND CORONARY ANGIOGRAPHY N/A 09/03/2017   Procedure: RIGHT/LEFT HEART CATH AND CORONARY ANGIOGRAPHY;  Surgeon: Jolaine Artist, MD;  Location: [place] [facility];   Service: Cardiovascular;  Laterality: N/A;    Current Medications: Current Meds  Medication Sig  . acetaminophen (TYLENOL 8 HOUR ARTHRITIS PAIN) 650 MG CR tablet Take 650 mg by mouth every 8 (eight) hours as needed for pain.  Marland Kitchen amiodarone (PACERONE) 200 MG tablet Take 1 tablet (200 mg total) by mouth daily.  Marland Kitchen apixaban (ELIQUIS) 5 MG TABS tablet Take 2.5 mg by mouth 2 (two) times daily.  . empagliflozin (JARDIANCE) 25 MG TABS tablet Take 25 mg by mouth daily.  Marland Kitchen ezetimibe (ZETIA) 10 MG tablet Take 1 tablet (10 mg total) by mouth daily.  . furosemide (LASIX) 80 MG tablet TAKE 1 TABLET ONCE DAILY.  Marland Kitchen Insulin Glargine (BASAGLAR KWIKPEN [place]) Inject 20 Units into the skin daily.  Marland Kitchen losartan (COZAAR) 25 MG tablet TAKE 0.5 TABLET BY MOUTH DAILY  .  lovastatin (MEVACOR) 40 MG tablet Take 40 mg by mouth at bedtime.   . nitroGLYCERIN (NITROSTAT) 0.4 MG SL tablet DISSOLVE 1 TABLET UNDER THE TONGUE EVERY 5 MINUTES AS NEEDED FOR CHEST PAIN.     Allergies:   Spironolactone   Social History   Socioeconomic History  . Marital status: Married    Spouse name: Not on file  . Number of children: Not on file  . Years of education: Not on file  . Highest education level: Not on file  Occupational History  . Not on file  Tobacco Use  . Smoking status: Never Smoker  . Smokeless tobacco: Never Used  Substance and Sexual Activity  . Alcohol use: No  . Drug use: No  . Sexual activity: Not on file  Other Topics Concern  . Not on file  Social History Narrative  . Not on file   Social Determinants of Health   Financial Resource Strain: Low Risk   . Difficulty of Paying Living Expenses: Not hard at all  Food Insecurity: No Food Insecurity  . Worried About Charity fundraiser in the Last Year: Never true  . Ran Out of Food in the Last Year: Never true  Transportation Needs: No Transportation Needs  . Lack of Transportation (Medical): No  . Lack of Transportation (Non-Medical): No  Physical  Activity: Inactive  . Days of Exercise per Week: 0 days  . Minutes of Exercise per Session: 0 min  Stress: No Stress Concern Present  . Feeling of Stress : Not at all  Social Connections: Slightly Isolated  . Frequency of Communication with Friends and Family: More than three times a week  . Frequency of Social Gatherings with Friends and Family: Never  . Attends Religious Services: More than 4 times per year  . Active Member of Clubs or Organizations: No  . Attends Archivist Meetings: Never  . Marital Status: Married     Family History: The patient's family history includes Heart Problems in his brother; Heart attack in his father. ROS:   Please see the history of present illness.    All other systems reviewed and are negative.  EKGs/Labs/Other Studies Reviewed:    The following studies were reviewed today:  EKG:  EKG ordered today and personally reviewed.  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  Recent Labs: 10/12/2018: NT-Pro BNP 401 06/20/2019: ALT 30; BUN 14; Creatinine, Ser 1.27; Hemoglobin 15.7; Platelets 168; Potassium 4.4; Sodium 143; TSH 1.570  Recent Lipid Panel    Component Value Date/Time   CHOL 193 06/20/2019 1247   TRIG 124 06/20/2019 1247   HDL 51 06/20/2019 1247   CHOLHDL 3.8 06/20/2019 1247   LDLCALC 120 (H) 06/20/2019 1247    Physical Exam:    VS:  BP 136/70 (BP Location: Left Arm, Patient Position: Sitting, Cuff Size: Normal)   Pulse (!) 54   Ht 5\' 8"  (1.727 m)   Wt 166 lb (75.3 kg)   SpO2 93%   BMI 25.24 kg/m     Wt Readings from Last 3 Encounters:  10/12/19 166 lb (75.3 kg)  06/20/19 173 lb (78.5 kg)  12/28/18 170 lb (77.1 kg)     GEN:  Well nourished, well developed in no acute distress HEENT: Normal NECK: No JVD; No carotid bruits LYMPHATICS: No lymphadenopathy CARDIAC: Grade 3/6 harsh holosystolic murmur encompasses S2 radiates to his carotids no aortic regurgitation RRR, no murmurs, rubs,  gallops RESPIRATORY:  Clear to auscultation without  rales, wheezing or rhonchi  ABDOMEN: Soft, non-tender, non-distended MUSCULOSKELETAL:  No edema; No deformity  SKIN: Warm and dry NEUROLOGIC:  Alert and oriented x 3 PSYCHIATRIC:  Normal affect    Signed, Shirlee More, MD  10/12/2019 10:51 AM    [place]

## 2019-10-11 NOTE — Progress Notes (Signed)
Cardiology Office Note:    Date:  10/12/2019   ID:  Bernard Kim, DOB 18-Oct-1930, MRN [id number]  PCP:  Bernard Collie, MD  Cardiologist:  Bernard More, MD    Referring MD: Bernard Collie, MD    ASSESSMENT:    1. Paroxysmal A-fib ([place])   2. On amiodarone therapy   3. Chronic anticoagulation   4. Hypertensive heart disease with heart failure ([place])   5. Coronary artery disease involving native coronary artery of native heart with angina pectoris ([place])   6. Aortic valve stenosis, etiology of cardiac valve disease unspecified   7. Stage 3b chronic kidney disease   8. Mixed hyperlipidemia    PLAN:    In order of problems listed above:  1. Stable 2. Continue amiodarone 3. Continue renal dose reduced anticoagulant 4. Stable heart failure no volume overload and continue his diuretic 5. Stable CAD having no anginal discomfort. 6. Clinically he has severe low-flow aortic stenosis I think he still is a good candidate for TAVR although he has  deteriorated and will be referred for left and right heart catheterization 7. Check labs today 8. Continue his low intensity statin. 9. Stable diabetes he takes SGLT2 inhibitor with his heart disease   Next appointment: Will be scheduled after his cardiac cath and TAVR if indicated   Medication Adjustments/Labs and Tests Ordered: Current medicines are reviewed at length with the patient today.  Concerns regarding medicines are outlined above.  No orders of the defined types were placed in this encounter.  No orders of the defined types were placed in this encounter.   Chief Complaint  Patient presents with  . Atrial Fibrillation    3 month follow up  . Congestive Heart Failure  . Aortic Stenosis  . Coronary Artery Disease  . Hypertension  . Hyperlipidemia  . Anticoagulation    History of Present Illness:    Bernard Kim is a 86 y.o. male with a hx of paroxysmal atrial fibrillation on amiodarone and anticoagulated hypertensive heart  disease coronary artery disease hyperlipidemia congestive heart failure and moderate aortic stenosis mild to moderate aortic regurgitation and enlargement of the ascending aorta last seen 06/20/2019.Left heart catheterization performed December 2018 showed three-vessel disease with 85% distal right coronary stenosis the other mild to moderate at that time ejection fraction was 20 to 25% due to cardiomyopathy from atrial fibrillation.  His most recent echocardiogram 10/18/2018 shows ejection fraction of 60 to 65% with mild left ventricular dilation mild left atrial enlargement mild mitral regurgitation moderate aortic stenosis P/M 48/26 mmHg and VTI ratio 0.3 AVA 1.0-1.1 cm2 mild to moderate aortic regurgitation mild to moderate enlargement of the ascending aorta.     Compliance with diet, lifestyle and medications: Yes  I reviewed the echocardiogram with the patient and his daughter.  Fortunately she is present as this became a very complex visit with both parents having deterioration.  Bernard Kim still cares for himself dozing during personal hygiene can climb a flight of stairs but clearly has deteriorated has marked exercise intolerance some exertional shortness of breath and had a syncopal episode the other evening.  He had a laceration on his scalp but did not seek medical attention.  He is not having angina.  His physical examination is consistent with severe stenosis review of his echocardiogram in my opinion he has low flow severe aortic stenosis symptomatic and at this time remains a candidate for TAVR.  I am concerned by his deterioration I have asked the  family that we move quickly.  He does have renal insufficiency.  Heart failure is compensated.  He is in sinus rhythm.  Echo 06/22/2019:  1. Left ventricular ejection fraction, by visual estimation, is 60 to 65%. The left ventricle has normal function. Normal left ventricular size. There is moderately increased left ventricular hypertrophy.  2. Left  ventricular diastolic Doppler parameters are consistent with impaired relaxation pattern of LV diastolic filling.  3. Global right ventricle has normal systolic function.The right ventricular size is normal. No increase in right ventricular wall thickness.  4. Left atrial size was mild-moderately dilated.  5. Right atrial size was normal.  6. The mitral valve is normal in structure. Mild mitral valve regurgitation. No evidence of mitral stenosis.  7. The tricuspid valve is normal in structure. Tricuspid valve regurgitation was not visualized by color flow Doppler.  8. The aortic valve is normal in structure. Aortic valve regurgitation is mild by color flow Doppler. Moderate to severe aortic valve stenosis.  AORTIC VALVE AV Area (Vmax):    0.73 cm AV Area (Vmean):   0.73 cm AV Area (VTI):     0.67 cm AV Vmax:           357.00 cm/s AV Vmean:          259.000 cm/s AV VTI:            0.898 m AV Peak Grad:      51.0 mmHg AV Mean Grad:      30.0 mmHg LVOT Vmax:         82.80 cm/s LVOT Vmean:        59.900 cm/s LVOT VTI:          0.191 m LVOT/AV VTI ratio: 0.21   9. The pulmonic valve was normal in structure. Pulmonic valve regurgitation is not visualized by color flow Doppler. 10. Normal pulmonary artery systolic pressure. 11. The inferior vena cava is normal in size with greater than 50% respiratory variability, suggesting right atrial pressure of 3 mmHg. Past Medical History:  Diagnosis Date  . Cancer ([place])    bladdre cancer  . CHF (congestive heart failure) ([place])   . Coronary artery disease   . Dyspnea   . Hypertension   . Stroke [facility])     Past Surgical History:  Procedure Laterality Date  . CARDIAC CATHETERIZATION    . HERNIA REPAIR    . JOINT REPLACEMENT    . PROSTATE SURGERY    . RIGHT/LEFT HEART CATH AND CORONARY ANGIOGRAPHY N/A 09/03/2017   Procedure: RIGHT/LEFT HEART CATH AND CORONARY ANGIOGRAPHY;  Surgeon: Jolaine Artist, MD;  Location: [place] [facility];   Service: Cardiovascular;  Laterality: N/A;    Current Medications: Current Meds  Medication Sig  . acetaminophen (TYLENOL 8 HOUR ARTHRITIS PAIN) 650 MG CR tablet Take 650 mg by mouth every 8 (eight) hours as needed for pain.  Marland Kitchen amiodarone (PACERONE) 200 MG tablet Take 1 tablet (200 mg total) by mouth daily.  Marland Kitchen apixaban (ELIQUIS) 5 MG TABS tablet Take 2.5 mg by mouth 2 (two) times daily.  . empagliflozin (JARDIANCE) 25 MG TABS tablet Take 25 mg by mouth daily.  Marland Kitchen ezetimibe (ZETIA) 10 MG tablet Take 1 tablet (10 mg total) by mouth daily.  . furosemide (LASIX) 80 MG tablet TAKE 1 TABLET ONCE DAILY.  Marland Kitchen Insulin Glargine (BASAGLAR KWIKPEN [place]) Inject 20 Units into the skin daily.  Marland Kitchen losartan (COZAAR) 25 MG tablet TAKE 0.5 TABLET BY MOUTH DAILY  .  lovastatin (MEVACOR) 40 MG tablet Take 40 mg by mouth at bedtime.   . nitroGLYCERIN (NITROSTAT) 0.4 MG SL tablet DISSOLVE 1 TABLET UNDER THE TONGUE EVERY 5 MINUTES AS NEEDED FOR CHEST PAIN.     Allergies:   Spironolactone   Social History   Socioeconomic History  . Marital status: Married    Spouse name: Not on file  . Number of children: Not on file  . Years of education: Not on file  . Highest education level: Not on file  Occupational History  . Not on file  Tobacco Use  . Smoking status: Never Smoker  . Smokeless tobacco: Never Used  Substance and Sexual Activity  . Alcohol use: No  . Drug use: No  . Sexual activity: Not on file  Other Topics Concern  . Not on file  Social History Narrative  . Not on file   Social Determinants of Health   Financial Resource Strain: Low Risk   . Difficulty of Paying Living Expenses: Not hard at all  Food Insecurity: No Food Insecurity  . Worried About Charity fundraiser in the Last Year: Never true  . Ran Out of Food in the Last Year: Never true  Transportation Needs: No Transportation Needs  . Lack of Transportation (Medical): No  . Lack of Transportation (Non-Medical): No  Physical  Activity: Inactive  . Days of Exercise per Week: 0 days  . Minutes of Exercise per Session: 0 min  Stress: No Stress Concern Present  . Feeling of Stress : Not at all  Social Connections: Slightly Isolated  . Frequency of Communication with Friends and Family: Kim than three times a week  . Frequency of Social Gatherings with Friends and Family: Never  . Attends Religious Services: Kim than 4 times per year  . Active Member of Clubs or Organizations: No  . Attends Archivist Meetings: Never  . Marital Status: Married     Family History: The patient's family history includes Heart Problems in his brother; Heart attack in his father. ROS:   Please see the history of present illness.    All other systems reviewed and are negative.  EKGs/Labs/Other Studies Reviewed:    The following studies were reviewed today:  EKG:  EKG ordered today and personally reviewed.  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  Recent Labs: 10/12/2018: NT-Pro BNP 401 06/20/2019: ALT 30; BUN 14; Creatinine, Ser 1.27; Hemoglobin 15.7; Platelets 168; Potassium 4.4; Sodium 143; TSH 1.570  Recent Lipid Panel    Component Value Date/Time   CHOL 193 06/20/2019 1247   TRIG 124 06/20/2019 1247   HDL 51 06/20/2019 1247   CHOLHDL 3.8 06/20/2019 1247   LDLCALC 120 (H) 06/20/2019 1247    Physical Exam:    VS:  BP 136/70 (BP Location: Left Arm, Patient Position: Sitting, Cuff Size: Normal)   Pulse (!) 54   Ht 5\' 8"  (1.727 m)   Wt 166 lb (75.3 kg)   SpO2 93%   BMI 25.24 kg/m     Wt Readings from Last 3 Encounters:  10/12/19 166 lb (75.3 kg)  06/20/19 173 lb (78.5 kg)  12/28/18 170 lb (77.1 kg)     GEN:  Well nourished, well developed in no acute distress HEENT: Normal NECK: No JVD; No carotid bruits LYMPHATICS: No lymphadenopathy CARDIAC: Grade 3/6 harsh holosystolic murmur encompasses S2 radiates to his carotids no aortic regurgitation RRR, no murmurs, rubs,  gallops RESPIRATORY:  Clear to auscultation without  rales, wheezing or rhonchi  ABDOMEN: Soft, non-tender, non-distended MUSCULOSKELETAL:  No edema; No deformity  SKIN: Warm and dry NEUROLOGIC:  Alert and oriented x 3 PSYCHIATRIC:  Normal affect    Signed, Bernard More, MD  10/12/2019 10:51 AM    [place]

## 2019-10-12 ENCOUNTER — Other Ambulatory Visit: Payer: Self-pay

## 2019-10-12 ENCOUNTER — Encounter: Payer: Self-pay | Admitting: Cardiology

## 2019-10-12 ENCOUNTER — Ambulatory Visit (INDEPENDENT_AMBULATORY_CARE_PROVIDER_SITE_OTHER): Payer: Medicare Other | Admitting: Cardiology

## 2019-10-12 VITALS — BP 136/70 | HR 54 | Ht 68.0 in | Wt 166.0 lb

## 2019-10-12 DIAGNOSIS — I110 Hypertensive heart disease with heart failure: Secondary | ICD-10-CM | POA: Diagnosis not present

## 2019-10-12 DIAGNOSIS — N1832 Chronic kidney disease, stage 3b: Secondary | ICD-10-CM

## 2019-10-12 DIAGNOSIS — I350 Nonrheumatic aortic (valve) stenosis: Secondary | ICD-10-CM

## 2019-10-12 DIAGNOSIS — Z79899 Other long term (current) drug therapy: Secondary | ICD-10-CM | POA: Diagnosis not present

## 2019-10-12 DIAGNOSIS — E782 Mixed hyperlipidemia: Secondary | ICD-10-CM

## 2019-10-12 DIAGNOSIS — I480 Paroxysmal atrial fibrillation: Secondary | ICD-10-CM

## 2019-10-12 DIAGNOSIS — Z7901 Long term (current) use of anticoagulants: Secondary | ICD-10-CM | POA: Diagnosis not present

## 2019-10-12 DIAGNOSIS — I25119 Atherosclerotic heart disease of native coronary artery with unspecified angina pectoris: Secondary | ICD-10-CM | POA: Diagnosis not present

## 2019-10-12 NOTE — Patient Instructions (Addendum)
Medication Instructions:  Your physician recommends that you continue on your current medications as directed. Please refer to the Current Medication list given to you today.  *If you need a refill on your cardiac medications before your next appointment, please call your pharmacy*  Lab Work: Bmp & cbc- Today   If you have labs (blood work) drawn today and your tests are completely normal, you will receive your results only by: Marland Kitchen MyChart Message (if you have MyChart) OR . A paper copy in the mail If you have any lab test that is abnormal or we need to change your treatment, we will call you to review the results.  Testing/Procedures: Your physician has requested that you have a cardiac catheterization. Cardiac catheterization is used to diagnose and/or treat various heart conditions. Doctors may recommend this procedure for a number of different reasons. The most common reason is to evaluate chest pain. Chest pain can be a symptom of coronary artery disease (CAD), and cardiac catheterization can show whether plaque is narrowing or blocking your heart's arteries. This procedure is also used to evaluate the valves, as well as measure the blood flow and oxygen levels in different parts of your heart. For further information please visit [facility]. Please follow instruction sheet, as given.  Your Pre-procedure COVID-19 Testing will be done on 10/15/2019 at [facility] [place], After your swab you will be given a mask to wear and instructed to go home and quarantine you are not allowed to have visitors until after your procedure. If you test positive you will be notified and your procedure will be cancelled.        Follow-Up: At [facility], you and your health needs are our priority.  As part of our continuing mission to provide you with exceptional heart care, we have created designated Provider Care Teams.  These Care Teams include your primary Cardiologist (physician)  and Advanced Practice Providers (APPs -  Physician Assistants and Nurse Practitioners) who all work together to provide you with the care you need, when you need it.  Your next appointment:   3 week(s)  The format for your next appointment:   In Person  Provider:   Shirlee More, MD  Other Instructions    [place] [id number] Dept: [phone] Loc: [place]  10/12/2019  You are scheduled for a Cardiac Catheterization on Wednesday, February 3 with Dr. Sherren Mocha.  1. Please arrive at the [facility] (Main Entrance A) at [facility]: [address] [place], [place] [address] at 6:30 AM (This time is two hours before your procedure to ensure your preparation). Free valet parking service is available.   Special note: Every effort is made to have your procedure done on time. Please understand that emergencies sometimes delay scheduled procedures.  2. Diet: Do not eat solid foods after midnight.  The patient may have clear liquids until 5am upon the day of the procedure.  3. Medication instructions in preparation for your procedure:  *For reference purposes while preparing patient instructions.   Delete this med list prior to printing instructions for patient.*  Hold your Eliquis 2 days before procedure   Hold Jardiance the day before your procedure   Take half a dose of your insulin the night before your procedure   Hold Lasix the morning of your procedure    On the morning of your  procedure, take your Aspirin and any morning medicines NOT listed above.  You may use sips of water.  5. Plan for one night stay--bring personal belongings. 6. Bring a current list of your medications and current insurance cards. 7. You MUST have a responsible person to drive you home. 8. Someone MUST be with you the first 24 hours after you arrive home or your  discharge will be delayed. 9. Please wear clothes that are easy to get on and off and wear slip-on shoes.  Thank you for allowing [place] to care for you!   -- [facility]

## 2019-10-13 LAB — BASIC METABOLIC PANEL
BUN/Creatinine Ratio: 12 (ref 10–24)
BUN: 16 mg/dL (ref 8–27)
CO2: 29 mmol/L (ref 20–29)
Calcium: 8.7 mg/dL (ref 8.6–10.2)
Chloride: 96 mmol/L (ref 96–106)
Creatinine, Ser: 1.3 mg/dL — ABNORMAL HIGH (ref 0.76–1.27)
GFR calc Af Amer: 57 mL/min/{1.73_m2} — ABNORMAL LOW (ref >59)
GFR calc non Af Amer: 49 mL/min/{1.73_m2} — ABNORMAL LOW (ref >59)
Glucose: 116 mg/dL — ABNORMAL HIGH (ref 65–99)
Potassium: 4 mmol/L (ref 3.5–5.2)
Sodium: 140 mmol/L (ref 134–144)

## 2019-10-13 LAB — CBC
Hematocrit: 51 % (ref 37.5–51.0)
Hemoglobin: 17.3 g/dL (ref 13.0–17.7)
MCH: 31.7 pg (ref 26.6–33.0)
MCHC: 33.9 g/dL (ref 31.5–35.7)
MCV: 94 fL (ref 79–97)
Platelets: 137 10*3/uL — ABNORMAL LOW (ref 150–450)
RBC: 5.45 x10E6/uL (ref 4.14–5.80)
RDW: 13 % (ref 11.6–15.4)
WBC: 6.2 10*3/uL (ref 3.4–10.8)

## 2019-10-14 DIAGNOSIS — E1169 Type 2 diabetes mellitus with other specified complication: Secondary | ICD-10-CM | POA: Diagnosis not present

## 2019-10-14 DIAGNOSIS — N182 Chronic kidney disease, stage 2 (mild): Secondary | ICD-10-CM | POA: Diagnosis not present

## 2019-10-14 DIAGNOSIS — E782 Mixed hyperlipidemia: Secondary | ICD-10-CM | POA: Diagnosis not present

## 2019-10-14 DIAGNOSIS — I129 Hypertensive chronic kidney disease with stage 1 through stage 4 chronic kidney disease, or unspecified chronic kidney disease: Secondary | ICD-10-CM | POA: Diagnosis not present

## 2019-10-15 ENCOUNTER — Inpatient Hospital Stay (HOSPITAL_COMMUNITY): Admission: RE | Admit: 2019-10-15 | Payer: Medicare Other | Source: Ambulatory Visit

## 2019-10-17 ENCOUNTER — Other Ambulatory Visit (HOSPITAL_COMMUNITY)
Admission: RE | Admit: 2019-10-17 | Discharge: 2019-10-17 | Disposition: A | Discharge status: Home / Self Care | Payer: Medicare Other | Source: Ambulatory Visit | Attending: Cardiovascular Disease | Admitting: Cardiovascular Disease

## 2019-10-17 ENCOUNTER — Telehealth: Payer: Self-pay | Admitting: Cardiology

## 2019-10-17 DIAGNOSIS — Z01812 Encounter for preprocedural laboratory examination: Secondary | ICD-10-CM | POA: Insufficient documentation

## 2019-10-17 DIAGNOSIS — Z20822 Contact with and (suspected) exposure to covid-19: Secondary | ICD-10-CM | POA: Insufficient documentation

## 2019-10-17 LAB — SARS CORONAVIRUS 2 (TAT 6-24 HRS): SARS Coronavirus 2: NEGATIVE

## 2019-10-17 NOTE — Telephone Encounter (Signed)
Patient's daughter calling in to speak with the lady that scheduled her dad's original covid test. States she was told his cath was on 10/19/19 so his covid test was scheduled for 10/15/19. When they got to the covid site they were told his cath was not until 10/26/19 and will need to come back next Saturday for the covid test. They were both really upset about this mix up.

## 2019-10-17 NOTE — Telephone Encounter (Signed)
Spoke with patients daughter Bernard Kim, Patient was seen by Dr. Bettina Gavia in office last week. I gave patient instructions to have cath done on 10/19/2019 since that was the day I told the cath lab scheduler we wanted to schedule, she confirmed day and time was available with Dr. Burt Knack per Dr. Bettina Gavia that is who he wanted to do the cath. I have called and rescheduled patient to have cath done on 10/19/2019 with Dr Tamala Julian. Patient will go today to [facility] to get covid tested. I apologized to the patient and to the patients daughter for the misunderstanding. They were understanding and were very thankful for the call.

## 2019-10-18 ENCOUNTER — Telehealth: Payer: Self-pay | Admitting: *Deleted

## 2019-10-18 NOTE — Telephone Encounter (Signed)
Pt contacted pre-catheterization scheduled at [facility] for: Wednesday October 19, 2019 1 PM Verified arrival time and place: [place]) at: 11 AM   No solid food after midnight prior to cath, clear liquids until 5 AM day of procedure. Contrast allergy: no  Hold: Jardiance-AM of procedure Lasix-AM of procedure-GFR 49 Losartan-AM of procedure-GFR 49 Insulin-1/2 usual Insulin dose PM prior to procedure Apixaban-2 days prior to procedure  Except hold medications AM meds can be  taken pre-cath with sip of water including: ASA 81 mg   Confirmed patient has responsible adult to drive home post procedure and observe 24 hours after arriving home:   Currently, due to Covid-19 pandemic, only one person will be allowed with patient. Must be the same person for patient's entire stay and will be required to wear a mask. They will be asked to wait in the waiting room for the duration of the patient's stay.  Patients are required to wear a mask when they enter the hospital.  I attempted to contact patient to review procedure instructions, received message at home number listed does not receive anonymous calls, mobile number listed not working number.

## 2019-10-19 ENCOUNTER — Encounter (HOSPITAL_COMMUNITY)
Admission: RE | Disposition: A | Discharge status: Home / Self Care | Payer: Self-pay | Source: Home / Self Care | Attending: Cardiovascular Disease

## 2019-10-19 ENCOUNTER — Other Ambulatory Visit: Payer: Self-pay | Admitting: Pharmacy Technician

## 2019-10-19 ENCOUNTER — Ambulatory Visit (HOSPITAL_COMMUNITY)
Admission: RE | Admit: 2019-10-19 | Discharge: 2019-10-19 | Disposition: A | Discharge status: Home / Self Care | Payer: Medicare Other | Attending: Cardiovascular Disease | Admitting: Cardiovascular Disease

## 2019-10-19 ENCOUNTER — Other Ambulatory Visit: Payer: Self-pay

## 2019-10-19 DIAGNOSIS — Z794 Long term (current) use of insulin: Secondary | ICD-10-CM | POA: Diagnosis not present

## 2019-10-19 DIAGNOSIS — I480 Paroxysmal atrial fibrillation: Secondary | ICD-10-CM | POA: Diagnosis not present

## 2019-10-19 DIAGNOSIS — Z7901 Long term (current) use of anticoagulants: Secondary | ICD-10-CM | POA: Insufficient documentation

## 2019-10-19 DIAGNOSIS — Z79899 Other long term (current) drug therapy: Secondary | ICD-10-CM | POA: Diagnosis not present

## 2019-10-19 DIAGNOSIS — Z8249 Family history of ischemic heart disease and other diseases of the circulatory system: Secondary | ICD-10-CM | POA: Diagnosis not present

## 2019-10-19 DIAGNOSIS — I25119 Atherosclerotic heart disease of native coronary artery with unspecified angina pectoris: Secondary | ICD-10-CM | POA: Diagnosis present

## 2019-10-19 DIAGNOSIS — Z888 Allergy status to other drugs, medicaments and biological substances status: Secondary | ICD-10-CM | POA: Insufficient documentation

## 2019-10-19 DIAGNOSIS — I350 Nonrheumatic aortic (valve) stenosis: Secondary | ICD-10-CM | POA: Diagnosis not present

## 2019-10-19 DIAGNOSIS — I080 Rheumatic disorders of both mitral and aortic valves: Secondary | ICD-10-CM | POA: Diagnosis not present

## 2019-10-19 DIAGNOSIS — E1122 Type 2 diabetes mellitus with diabetic chronic kidney disease: Secondary | ICD-10-CM | POA: Diagnosis not present

## 2019-10-19 DIAGNOSIS — I10 Essential (primary) hypertension: Secondary | ICD-10-CM | POA: Diagnosis present

## 2019-10-19 DIAGNOSIS — N1832 Chronic kidney disease, stage 3b: Secondary | ICD-10-CM | POA: Insufficient documentation

## 2019-10-19 DIAGNOSIS — I2510 Atherosclerotic heart disease of native coronary artery without angina pectoris: Secondary | ICD-10-CM | POA: Insufficient documentation

## 2019-10-19 DIAGNOSIS — I429 Cardiomyopathy, unspecified: Secondary | ICD-10-CM | POA: Insufficient documentation

## 2019-10-19 DIAGNOSIS — Z8673 Personal history of transient ischemic attack (TIA), and cerebral infarction without residual deficits: Secondary | ICD-10-CM | POA: Insufficient documentation

## 2019-10-19 DIAGNOSIS — I2584 Coronary atherosclerosis due to calcified coronary lesion: Secondary | ICD-10-CM | POA: Insufficient documentation

## 2019-10-19 DIAGNOSIS — E119 Type 2 diabetes mellitus without complications: Secondary | ICD-10-CM

## 2019-10-19 DIAGNOSIS — I509 Heart failure, unspecified: Secondary | ICD-10-CM | POA: Diagnosis not present

## 2019-10-19 DIAGNOSIS — I130 Hypertensive heart and chronic kidney disease with heart failure and stage 1 through stage 4 chronic kidney disease, or unspecified chronic kidney disease: Secondary | ICD-10-CM | POA: Diagnosis not present

## 2019-10-19 DIAGNOSIS — E782 Mixed hyperlipidemia: Secondary | ICD-10-CM | POA: Insufficient documentation

## 2019-10-19 DIAGNOSIS — E785 Hyperlipidemia, unspecified: Secondary | ICD-10-CM | POA: Diagnosis present

## 2019-10-19 HISTORY — PX: RIGHT/LEFT HEART CATH AND CORONARY ANGIOGRAPHY: CATH118266

## 2019-10-19 LAB — POCT I-STAT 7, (LYTES, BLD GAS, ICA,H+H)
Acid-Base Excess: 2 mmol/L (ref 0.0–2.0)
Bicarbonate: 27.3 mmol/L (ref 20.0–28.0)
Calcium, Ion: 1.14 mmol/L — ABNORMAL LOW (ref 1.15–1.40)
HCT: 45 % (ref 39.0–52.0)
Hemoglobin: 15.3 g/dL (ref 13.0–17.0)
O2 Saturation: 98 %
Potassium: 3.3 mmol/L — ABNORMAL LOW (ref 3.5–5.1)
Sodium: 141 mmol/L (ref 135–145)
TCO2: 29 mmol/L (ref 22–32)
pCO2 arterial: 43.5 mmHg (ref 32.0–48.0)
pH, Arterial: 7.405 (ref 7.350–7.450)
pO2, Arterial: 102 mmHg (ref 83.0–108.0)

## 2019-10-19 LAB — GLUCOSE, CAPILLARY
Glucose-Capillary: 103 mg/dL — ABNORMAL HIGH (ref 70–99)
Glucose-Capillary: 88 mg/dL (ref 70–99)

## 2019-10-19 LAB — POCT I-STAT EG7
Acid-Base Excess: 3 mmol/L — ABNORMAL HIGH (ref 0.0–2.0)
Bicarbonate: 28.6 mmol/L — ABNORMAL HIGH (ref 20.0–28.0)
Calcium, Ion: 1.14 mmol/L — ABNORMAL LOW (ref 1.15–1.40)
HCT: 45 % (ref 39.0–52.0)
Hemoglobin: 15.3 g/dL (ref 13.0–17.0)
O2 Saturation: 73 %
Potassium: 3.3 mmol/L — ABNORMAL LOW (ref 3.5–5.1)
Sodium: 141 mmol/L (ref 135–145)
TCO2: 30 mmol/L (ref 22–32)
pCO2, Ven: 45.6 mmHg (ref 44.0–60.0)
pH, Ven: 7.404 (ref 7.250–7.430)
pO2, Ven: 39 mmHg (ref 32.0–45.0)

## 2019-10-19 SURGERY — RIGHT/LEFT HEART CATH AND CORONARY ANGIOGRAPHY
Anesthesia: LOCAL

## 2019-10-19 MED ORDER — FUROSEMIDE 80 MG PO TABS: 40.0000 mg | ORAL_TABLET | Freq: Every day | ORAL | 3 refills | Status: DC

## 2019-10-19 MED ORDER — HEPARIN (PORCINE) IN NACL 1000-0.9 UT/500ML-% IV SOLN
INTRAVENOUS | Status: AC
  Filled 2019-10-19: qty 1000

## 2019-10-19 MED ORDER — ACETAMINOPHEN 325 MG PO TABS: 650.0000 mg | ORAL_TABLET | ORAL | Status: DC | PRN

## 2019-10-19 MED ORDER — MIDAZOLAM HCL 2 MG/2ML IJ SOLN
INTRAMUSCULAR | Status: DC | PRN
  Administered 2019-10-19: 1 mg via INTRAVENOUS

## 2019-10-19 MED ORDER — LIDOCAINE HCL (PF) 1 % IJ SOLN
INTRAMUSCULAR | Status: AC
  Filled 2019-10-19: qty 30

## 2019-10-19 MED ORDER — VERAPAMIL HCL 2.5 MG/ML IV SOLN
INTRAVENOUS | Status: DC | PRN
  Administered 2019-10-19: 11:00:00 10 mL via INTRA_ARTERIAL

## 2019-10-19 MED ORDER — SODIUM CHLORIDE 0.9% FLUSH: 3.0000 mL | Freq: Two times a day (BID) | INTRAVENOUS | Status: DC

## 2019-10-19 MED ORDER — SODIUM CHLORIDE 0.9 % IV SOLN: INTRAVENOUS | Status: DC

## 2019-10-19 MED ORDER — FENTANYL CITRATE (PF) 100 MCG/2ML IJ SOLN
INTRAMUSCULAR | Status: AC
  Filled 2019-10-19: qty 2

## 2019-10-19 MED ORDER — ONDANSETRON HCL 4 MG/2ML IJ SOLN: 4.0000 mg | Freq: Four times a day (QID) | INTRAMUSCULAR | Status: DC | PRN

## 2019-10-19 MED ORDER — HEPARIN SODIUM (PORCINE) 1000 UNIT/ML IJ SOLN
INTRAMUSCULAR | Status: DC | PRN
  Administered 2019-10-19: 4000 [IU] via INTRAVENOUS

## 2019-10-19 MED ORDER — MIDAZOLAM HCL 2 MG/2ML IJ SOLN
INTRAMUSCULAR | Status: AC
  Filled 2019-10-19: qty 2

## 2019-10-19 MED ORDER — IOHEXOL 350 MG/ML SOLN
INTRAVENOUS | Status: DC | PRN
  Administered 2019-10-19: 12:00:00 75 mL

## 2019-10-19 MED ORDER — VERAPAMIL HCL 2.5 MG/ML IV SOLN
INTRAVENOUS | Status: AC
  Filled 2019-10-19: qty 2

## 2019-10-19 MED ORDER — FENTANYL CITRATE (PF) 100 MCG/2ML IJ SOLN
INTRAMUSCULAR | Status: DC | PRN
  Administered 2019-10-19: 25 ug via INTRAVENOUS

## 2019-10-19 MED ORDER — SODIUM CHLORIDE 0.9% FLUSH: 3.0000 mL | INTRAVENOUS | Status: DC | PRN

## 2019-10-19 MED ORDER — SODIUM CHLORIDE 0.9 % IV SOLN: 250.0000 mL | INTRAVENOUS | Status: DC | PRN

## 2019-10-19 MED ORDER — ASPIRIN 81 MG PO CHEW: 81.0000 mg | CHEWABLE_TABLET | ORAL | Status: DC

## 2019-10-19 MED ORDER — APIXABAN 2.5 MG PO TABS: 2.5000 mg | ORAL_TABLET | Freq: Two times a day (BID) | ORAL | Status: AC

## 2019-10-19 MED ORDER — HEPARIN SODIUM (PORCINE) 1000 UNIT/ML IJ SOLN
INTRAMUSCULAR | Status: AC
  Filled 2019-10-19: qty 1

## 2019-10-19 MED ORDER — LIDOCAINE HCL (PF) 1 % IJ SOLN
INTRAMUSCULAR | Status: DC | PRN
  Administered 2019-10-19: 2 mL
  Administered 2019-10-19: 1 mL

## 2019-10-19 MED ORDER — HEPARIN (PORCINE) IN NACL 1000-0.9 UT/500ML-% IV SOLN
INTRAVENOUS | Status: DC | PRN
  Administered 2019-10-19: 500 mL

## 2019-10-19 SURGICAL SUPPLY — 14 items
CATH BALLN WEDGE 5F 110CM (CATHETERS) ×2 IMPLANT
CATH INFINITI 5 FR JL3.5 (CATHETERS) ×1 IMPLANT
CATH INFINITI 5FR JK (CATHETERS) ×1 IMPLANT
DEVICE RAD COMP TR BAND LRG (VASCULAR PRODUCTS) ×2 IMPLANT
GLIDESHEATH SLEND SS 6F .021 (SHEATH) ×2 IMPLANT
GUIDEWIRE INQWIRE 1.5J.035X260 (WIRE) ×1 IMPLANT
INQWIRE 1.5J .035X260CM (WIRE) ×2
KIT HEART LEFT (KITS) ×2 IMPLANT
PACK CARDIAC CATHETERIZATION (CUSTOM PROCEDURE TRAY) ×2 IMPLANT
SHEATH GLIDE SLENDER 4/5FR (SHEATH) ×2 IMPLANT
TRANSDUCER W/STOPCOCK (MISCELLANEOUS) ×2 IMPLANT
TUBING CIL FLEX 10 FLL-RA (TUBING) ×2 IMPLANT
WIRE EMERALD ST .035X150CM (WIRE) ×1 IMPLANT
WIRE HI TORQ VERSACORE-J 145CM (WIRE) ×1 IMPLANT

## 2019-10-19 NOTE — Interval H&P Note (Signed)
Cath Lab Visit (complete for each Cath Lab visit)  Clinical Evaluation Leading to the Procedure:   ACS: No.  Non-ACS:    Anginal Classification: CCS III  Anti-ischemic medical therapy: No Therapy  Non-Invasive Test Results: No non-invasive testing performed  Prior CABG: No previous CABG      History and Physical Interval Note:  10/19/2019 10:51 AM  Bernard Kim  has presented today for surgery, with the diagnosis of aortic stenosis.  The various methods of treatment have been discussed with the patient and family. After consideration of risks, benefits and other options for treatment, the patient has consented to  Procedure(s): RIGHT/LEFT HEART CATH AND CORONARY ANGIOGRAPHY (N/A) as a surgical intervention.  The patient's history has been reviewed, patient examined, no change in status, stable for surgery.  I have reviewed the patient's chart and labs.  Questions were answered to the patient's satisfaction.     Kathlyn [place]

## 2019-10-19 NOTE — Patient Outreach (Signed)
[facility]) Care Management  10/19/2019  LAWYER BUSTER 11-14-1930 [id number]    Successful call placed to patient's daughter regarding patient assistance medication delivery of Basaglar and Vania Rea, HIPAA identifiers verified. His daughter was in the room with him and confirmed that he received both medications from [place]. He confirms he is aware of how to obtain refills with the programs. Patient states that he will contact myself or [facility] when he thinks he has spent required amount to apply for his Eliquis.  Follow up:  Will route note to [place] for case closure  Maud Deed. Chana Bode Maury Certified Pharmacy Technician [place] Management Direct [phone]

## 2019-10-19 NOTE — Discharge Instructions (Signed)
Radial Site Care  This sheet gives you information about how to care for yourself after your procedure. Your health care provider may also give you more specific instructions. If you have problems or questions, contact your health care provider. What can I expect after the procedure? After the procedure, it is common to have:  Bruising and tenderness at the catheter insertion area. Follow these instructions at home: Medicines  Take over-the-counter and prescription medicines only as told by your health care provider. Insertion site care  Follow instructions from your health care provider about how to take care of your insertion site. Make sure you: ? Wash your hands with soap and water before you change your bandage (dressing). If soap and water are not available, use hand sanitizer. ? Change your dressing as told by your health care provider. ? Leave stitches (sutures), skin glue, or adhesive strips in place. These skin closures may need to stay in place for 2 weeks or longer. If adhesive strip edges start to loosen and curl up, you may trim the loose edges. Do not remove adhesive strips completely unless your health care provider tells you to do that.  Check your insertion site every day for signs of infection. Check for: ? Redness, swelling, or pain. ? Fluid or blood. ? Pus or a bad smell. ? Warmth.  Do not take baths, swim, or use a hot tub until your health care provider approves.  You may shower 24-48 hours after the procedure, or as directed by your health care provider. ? Remove the dressing and gently wash the site with plain soap and water. ? Pat the area dry with a clean towel. ? Do not rub the site. That could cause bleeding.  Do not apply powder or lotion to the site. Activity   For 24 hours after the procedure, or as directed by your health care provider: ? Do not flex or bend the affected arm. ? Do not push or pull heavy objects with the affected arm. ? Do not  drive yourself home from the hospital or clinic. You may drive 24 hours after the procedure unless your health care provider tells you not to. ? Do not operate machinery or power tools.  Do not lift anything that is heavier than 10 lb (4.5 kg), or the limit that you are told, until your health care provider says that it is safe.  Ask your health care provider when it is okay to: ? Return to work or school. ? Resume usual physical activities or sports. ? Resume sexual activity. General instructions  If the catheter site starts to bleed, raise your arm and put firm pressure on the site. If the bleeding does not stop, get help right away. This is a medical emergency.  If you went home on the same day as your procedure, a responsible adult should be with you for the first 24 hours after you arrive home.  Keep all follow-up visits as told by your health care provider. This is important. Contact a health care provider if:  You have a fever.  You have redness, swelling, or yellow drainage around your insertion site. Get help right away if:  You have unusual pain at the radial site.  The catheter insertion area swells very fast.  The insertion area is bleeding, and the bleeding does not stop when you hold steady pressure on the area.  Your arm or hand becomes pale, cool, tingly, or numb. These symptoms may represent a serious problem   that is an emergency. Do not wait to see if the symptoms will go away. Get medical help right away. Call your local emergency services (911 in the [place] Do not drive yourself to the hospital. Summary  After the procedure, it is common to have bruising and tenderness at the site.  Follow instructions from your health care provider about how to take care of your radial site wound. Check the wound every day for signs of infection.  Do not lift anything that is heavier than 10 lb (4.5 kg), or the limit that you are told, until your health care provider says  that it is safe. This information is not intended to replace advice given to you by your health care provider. Make sure you discuss any questions you have with your health care provider. Document Revised: 10/07/2017 Document Reviewed: 10/07/2017 Elsevier Patient Education  2020 [employer]  

## 2019-10-20 ENCOUNTER — Telehealth: Payer: Self-pay | Admitting: Cardiology

## 2019-10-20 ENCOUNTER — Encounter: Payer: Self-pay | Admitting: Cardiology

## 2019-10-20 ENCOUNTER — Other Ambulatory Visit: Payer: Self-pay | Admitting: *Deleted

## 2019-10-20 ENCOUNTER — Other Ambulatory Visit: Payer: Self-pay | Admitting: Pharmacist

## 2019-10-20 ENCOUNTER — Other Ambulatory Visit: Payer: Self-pay | Admitting: Cardiology

## 2019-10-20 ENCOUNTER — Other Ambulatory Visit: Payer: Self-pay

## 2019-10-20 DIAGNOSIS — I350 Nonrheumatic aortic (valve) stenosis: Secondary | ICD-10-CM

## 2019-10-20 MED ORDER — AMIODARONE HCL 200 MG PO TABS: 200.0000 mg | ORAL_TABLET | Freq: Every day | ORAL | 1 refills | Status: DC

## 2019-10-20 NOTE — Telephone Encounter (Signed)
Rx refill sent to pharmacy. 

## 2019-10-20 NOTE — Telephone Encounter (Signed)
Refill sent to [facility]

## 2019-10-20 NOTE — Telephone Encounter (Signed)
error 

## 2019-10-20 NOTE — Telephone Encounter (Signed)
*  STAT* If patient is at the pharmacy, call can be transferred to refill team.   1. Which medications need to be refilled? (please list name of each medication and dose if known) amiodarone (PACERONE) 200 MG tablet  2. Which pharmacy/location (including street and city if local pharmacy) is medication to be sent to? [id number] - [place], [place] - [address] AT [address]  3. Do they need a 30 day or 90 day supply? 90 day supply

## 2019-10-20 NOTE — Patient Outreach (Signed)
[place] [facility]) Care Management  [place]   10/20/2019  Bernard Kim [birth year] [id number]  Patient has been approved for Basaglar and Jardiance patient assistance program(s).  Patient has been instructed on how to order refills and renewal process for 2021.    Patient also assisted with Eliquis patient assistance program in 2020 after meeting [employer].  Patient aware he may reach out to [facility] for Eliquis assistance again in 2021 after meeting 3% of household income on co-pays.  This may take longer for him to reach since he is no longer having to pay co-pays for insulin and Jardiance.    Plan: -Will close [facility] pharmacy case as goals of care have been met.  -I have provided my contact information if patient or family needs to reach out to me in the future.  -Thank you for allowing [facility] pharmacy to be involved in this patient's care.    [name], PharmD, [place] [phone]

## 2019-10-22 ENCOUNTER — Other Ambulatory Visit (HOSPITAL_COMMUNITY): Payer: Medicare Other

## 2019-10-24 ENCOUNTER — Ambulatory Visit: Payer: Medicare Other | Admitting: Cardiovascular Disease

## 2019-10-24 ENCOUNTER — Other Ambulatory Visit: Payer: Self-pay

## 2019-10-24 ENCOUNTER — Other Ambulatory Visit (HOSPITAL_COMMUNITY): Payer: Medicare Other

## 2019-10-24 ENCOUNTER — Encounter: Payer: Self-pay | Admitting: Cardiovascular Disease

## 2019-10-24 VITALS — BP 128/56 | HR 57 | Ht 68.0 in | Wt 170.0 lb

## 2019-10-24 DIAGNOSIS — I350 Nonrheumatic aortic (valve) stenosis: Secondary | ICD-10-CM

## 2019-10-24 LAB — BASIC METABOLIC PANEL
BUN/Creatinine Ratio: 9 — ABNORMAL LOW (ref 10–24)
BUN: 11 mg/dL (ref 8–27)
CO2: 21 mmol/L (ref 20–29)
Calcium: 8.9 mg/dL (ref 8.6–10.2)
Chloride: 106 mmol/L (ref 96–106)
Creatinine, Ser: 1.21 mg/dL (ref 0.76–1.27)
GFR calc Af Amer: 62 mL/min/{1.73_m2} (ref >59)
GFR calc non Af Amer: 54 mL/min/{1.73_m2} — ABNORMAL LOW (ref >59)
Glucose: 116 mg/dL — ABNORMAL HIGH (ref 65–99)
Potassium: 4.3 mmol/L (ref 3.5–5.2)
Sodium: 142 mmol/L (ref 134–144)

## 2019-10-24 NOTE — Patient Instructions (Addendum)
Medication Instructions:  Your provider recommends that you continue on your current medications as directed. Please refer to the Current Medication list given to you today.    Labwork: TODAY: BMET  Testing/Procedures/Follow-up: Your appointment with Dr. Bettina Gavia has been cancelled.  Please see attached sheet for future appointments.

## 2019-10-24 NOTE — Progress Notes (Signed)
[facility]   MULTIDISCIPLINARY HEART VALVE TEAM  Date:  10/24/2019   ID:  Bernard Kim, DOB 1930-10-14, MRN [id number]  PCP:  Marco Collie, MD   Chief Complaint  Patient presents with  . Shortness of Breath     HISTORY OF PRESENT ILLNESS: Bernard Kim is a 86 y.o. male who presents for evaluation of aortic stenosis, referred by Dr Bettina Gavia.  He is here with his son today. The patient is married and lives independently with his wife. They have 2 children, a daughter who lives locally and his son who lives in [place], [place].   He was hospitalized in 2018 with acute heart failure related to atrial fibrillation with RVR and severe LV dysfunction noted at that time. He was chemically cardioverted with amiodarone and has remained on this therapy since that time. LVEF has subsequently normalized. He is anticoagulated with apixaban and denies any significant bleeding problems. He bounced back after his hospitalization and did quite well until the past 3-4 months. 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 However, over the past few months he feels weak and tired. He is short of breath with one flight of stairs. He is much less active than he has previously been. He denies chest pain or pressure with activity. His balance is poor. He complains of intermittent lightheadedness and had a syncopal spell a few weeks ago when he was getting out of bed.   He recently underwent echo and cardiac cath studies, and is now referred for further evaluation of severe aortic stenosis.   The patient has had bladder cancer and has undergone surveillance cystoscopy, reporting stability of his disease. He denies any recent issues with hematuria. He had a TIA remotely and has fully recovered.   The patient has had regular dental care and reports full dentures.  Past Medical History:  Diagnosis Date  . Cancer ([place])    bladdre cancer  . CHF (congestive heart failure) ([place])   .  Coronary artery disease   . Dyspnea   . Hypertension   . Stroke [facility])     Current Outpatient Medications  Medication Sig Dispense Refill  . acetaminophen (TYLENOL 8 HOUR ARTHRITIS PAIN) 650 MG CR tablet Take 650 mg by mouth at bedtime.     Marland Kitchen acetaminophen (TYLENOL) 500 MG tablet Take 500 mg by mouth every 6 (six) hours as needed for mild pain (out of arthritis).    Marland Kitchen amiodarone (PACERONE) 200 MG tablet Take 1 tablet (200 mg total) by mouth daily. 90 tablet 1  . apixaban (ELIQUIS) 2.5 MG TABS tablet Take 1 tablet (2.5 mg total) by mouth 2 (two) times daily.    . empagliflozin (JARDIANCE) 25 MG TABS tablet Take 25 mg by mouth daily.    Marland Kitchen ezetimibe (ZETIA) 10 MG tablet Take 1 tablet (10 mg total) by mouth daily. 30 tablet 3  . furosemide (LASIX) 80 MG tablet TAKE 1 TABLET ONCE DAILY. 90 tablet 1  . Insulin Glargine (BASAGLAR KWIKPEN [place]) Inject 20 Units into the skin at bedtime.     Marland Kitchen losartan (COZAAR) 25 MG tablet TAKE 0.5 TABLET BY MOUTH DAILY (Patient taking differently: Take 12.5 mg by mouth daily. ) 30 tablet 3  . lovastatin (MEVACOR) 40 MG tablet Take 40 mg by mouth at bedtime.     . nitroGLYCERIN (NITROSTAT) 0.4 MG SL tablet DISSOLVE 1 TABLET UNDER THE TONGUE EVERY 5 MINUTES AS NEEDED FOR CHEST PAIN. 25  tablet 3   No current facility-administered medications for this visit.    ALLERGIES:   Spironolactone   SOCIAL HISTORY:  The patient  reports that he has never smoked. He has never used smokeless tobacco. He reports that he does not drink alcohol or use drugs.   FAMILY HISTORY:  The patient's family history includes Heart Problems in his brother; Heart attack in his father.   REVIEW OF SYSTEMS:  Positive for ankle pain related to arthritis, balance issues.   All other systems are reviewed and negative.   PHYSICAL EXAM: VS:  BP (!) 128/56   Pulse (!) 57   Ht 5\' 8"  (1.727 m)   Wt 170 lb (77.1 kg)   SpO2 99%   BMI 25.85 kg/m  , BMI Body mass index is 25.85 kg/m. GEN: Well  nourished, well developed, pleasant elderly male in no acute distress HEENT: normal Neck: No JVD. carotids 2+ with bilateral bruits  Cardiac: The heart is RRR with a 3/6 harsh systolic murmur at the RUSB. No edema. Pedal pulses 2+ = bilaterally  Respiratory:  clear to auscultation bilaterally GI: soft, nontender, nondistended, + BS MS: no deformity or atrophy Skin: warm and dry, no rash Neuro:  Strength and sensation are intact Psych: euthymic mood, full affect  EKG:  EKG from 10/12/2019 reviewed and demonstrates sinus bradycardia 55 bpm, age-indeterminate inferior infarct  RECENT LABS: 06/20/2019: ALT 30; TSH 1.570 10/12/2019: BUN 16; Creatinine, Ser 1.30; Platelets 137 10/19/2019: Hemoglobin 15.3; Potassium 3.3; Sodium 141  06/20/2019: Chol/HDL Ratio 3.8; Cholesterol, Total 193; HDL 51; LDL Chol Calc (NIH) 120; Triglycerides 124   Estimated Creatinine Clearance: 39.5 mL/min (A) (by C-G formula based on SCr of 1.3 mg/dL (H)).   Wt Readings from Last 3 Encounters:  10/24/19 170 lb (77.1 kg)  10/19/19 165 lb (74.8 kg)  10/12/19 166 lb (75.3 kg)     CARDIAC STUDIES:  Echo:  IMPRESSIONS    1. Left ventricular ejection fraction, by visual estimation, is 60 to  65%. The left ventricle has normal function. Normal left ventricular size.  There is moderately increased left ventricular hypertrophy.  2. Left ventricular diastolic Doppler parameters are consistent with  impaired relaxation pattern of LV diastolic filling.  3. Global right ventricle has normal systolic function.The right  ventricular size is normal. No increase in right ventricular wall  thickness.  4. Left atrial size was mild-moderately dilated.  5. Right atrial size was normal.  6. The mitral valve is normal in structure. Mild mitral valve  regurgitation. No evidence of mitral stenosis.  7. The tricuspid valve is normal in structure. Tricuspid valve  regurgitation was not visualized by color flow Doppler.  8.  The aortic valve is normal in structure. Aortic valve regurgitation is  mild by color flow Doppler. Moderate aortic valve stenosis.  9. The pulmonic valve was normal in structure. Pulmonic valve  regurgitation is not visualized by color flow Doppler.  10. Normal pulmonary artery systolic pressure.  11. The inferior vena cava is normal in size with greater than 50%  respiratory variability, suggesting right atrial pressure of 3 mmHg.   FINDINGS  Left Ventricle: Left ventricular ejection fraction, by visual estimation,  is 60 to 65%. The left ventricle has normal function. There is moderately  increased left ventricular hypertrophy. Normal left ventricular size.  Spectral Doppler shows Left  ventricular diastolic Doppler parameters are consistent with impaired  relaxation pattern of LV diastolic filling.   Right Ventricle: The right ventricular size is  normal. No increase in  right ventricular wall thickness. Global RV systolic function is has  normal systolic function. The tricuspid regurgitant velocity is 2.51 m/s,  and with an assumed right atrial pressure  of 3 mmHg, the estimated right ventricular systolic pressure is normal at  28.1 mmHg.   Left Atrium: Left atrial size was mild-moderately dilated.   Right Atrium: Right atrial size was normal in size   Pericardium: There is no evidence of pericardial effusion.   Mitral Valve: The mitral valve is normal in structure. There is mild  thickening of the mitral valve leaflet(s). No evidence of mitral valve  stenosis by observation. Mild mitral valve regurgitation.   Tricuspid Valve: The tricuspid valve is normal in structure. Tricuspid  valve regurgitation was not visualized by color flow Doppler.   Aortic Valve: The aortic valve is normal in structure. Aortic valve  regurgitation is mild by color flow Doppler. Aortic regurgitation PHT  measures 572 msec. Moderate aortic stenosis is present. Aortic valve mean  gradient  measures 30.0 mmHg. Aortic valve  peak gradient measures 51.0 mmHg. Aortic valve area, by VTI measures 0.67  cm.   Pulmonic Valve: The pulmonic valve was normal in structure. Pulmonic valve  regurgitation is not visualized by color flow Doppler.   Aorta: The aortic root, ascending aorta and aortic arch are all  structurally normal, with no evidence of dilitation or obstruction.   Venous: The inferior vena cava is normal in size with greater than 50%  respiratory variability, suggesting right atrial pressure of 3 mmHg.   IAS/Shunts: No atrial level shunt detected by color flow Doppler. No  ventricular septal defect is seen or detected. There is no evidence of an  atrial septal defect.      LEFT VENTRICLE  PLAX 2D  LVIDd:     5.00 cm Diastology  LVIDs:     3.80 cm LV e' lateral:  2.72 cm/s  LV PW:     1.70 cm LV E/e' lateral: 18.4  LV IVS:    1.80 cm LV e' medial:  3.15 cm/s  LVOT diam:   2.00 cm LV E/e' medial: 15.9  LV SV:     56 ml  LV SV Index:  28.81  LVOT Area:   3.14 cm     RIGHT VENTRICLE     IVC  TAPSE (M-mode): 2.2 cm IVC diam: 1.30 cm   LEFT ATRIUM       Index    RIGHT ATRIUM      Index  LA diam:    4.50 cm 2.34 cm/m RA Area:   16.30 cm  LA Vol (A2C):  73.5 ml 38.25 ml/m RA Volume:  33.30 ml 17.33 ml/m  LA Vol (A4C):  68.8 ml 35.80 ml/m  LA Biplane Vol: 71.6 ml 37.26 ml/m  AORTIC VALVE  AV Area (Vmax):  0.73 cm  AV Area (Vmean):  0.73 cm  AV Area (VTI):   0.67 cm  AV Vmax:      357.00 cm/s  AV Vmean:     259.000 cm/s  AV VTI:      0.898 m  AV Peak Grad:   51.0 mmHg  AV Mean Grad:   30.0 mmHg  LVOT Vmax:     82.80 cm/s  LVOT Vmean:    59.900 cm/s  LVOT VTI:     0.191 m  LVOT/AV VTI ratio: 0.21  AI PHT:      572 msec    AORTA  Ao Root diam:  3.70 cm  Ao Asc diam: 3.80 cm   MV E velocity: 50.00 cm/s 103 cm/s TRICUSPID VALVE                    TR Peak grad:  25.1 mmHg                   TR Vmax:    255.00 cm/s                     SHUNTS                   Systemic VTI: 0.19 m                   Systemic Diam: 2.00 cm      Cardiac Cath:  Conclusion    Mid LM to Dist LM lesion is 40% stenosed.  Prox LAD lesion is 60% stenosed.  Ost 1st Diag to 1st Diag lesion is 30% stenosed.  Prox Cx to Mid Cx lesion is 30% stenosed.  Prox RCA lesion is 40% stenosed.  Mid RCA lesion is 30% stenosed.  Dist RCA lesion is 60% stenosed.  RPAV lesion is 85% stenosed.  The left ventricular systolic function is normal.  LV end diastolic pressure is normal.  The left ventricular ejection fraction is 55-65% by visual estimate.  There is severe aortic valve stenosis.   1.  Heavily calcified coronary arteries with moderate left main stenosis, moderate proximal LAD stenosis and significant stenosis in the right posterior AV groove.  The coronary arteries do not look different from most recent catheterization in 2018. 2.  Normal LV systolic function. 3.  Right heart catheterization showed low filling pressures with RA pressure of 1 mmHg and pulmonary capillary wedge pressure of 5, normal pulmonary pressure at 26/3 and normal cardiac output at 4.29 with a cardiac index of 2.27. 4.  Severe aortic stenosis with a mean gradient of 29.4 mmHg and valve area of 0.98.  Recommendations: Continue evaluation for TAVR.  I do not think coronary revascularization is needed. Eliquis can be resumed tomorrow. I decreased the dose of furosemide to 40 mg once daily.   Coronary Findings  Diagnostic Dominance: Right Left Main  Mid LM to Dist LM lesion 40% stenosed  Mid LM to Dist LM lesion is 40% stenosed.  Left Anterior Descending  Prox LAD lesion 60% stenosed  Prox LAD lesion is 60% stenosed.  First Diagonal Branch  Ost 1st Diag to 1st  Diag lesion 30% stenosed  Ost 1st Diag to 1st Diag lesion is 30% stenosed.  Left Circumflex  Prox Cx to Mid Cx lesion 30% stenosed  Prox Cx to Mid Cx lesion is 30% stenosed.  Right Coronary Artery  Prox RCA lesion 40% stenosed  Prox RCA lesion is 40% stenosed.  Mid RCA lesion 30% stenosed  Mid RCA lesion is 30% stenosed. The lesion was previously treated.  Dist RCA lesion 60% stenosed  Dist RCA lesion is 60% stenosed.  Right Posterior Atrioventricular Artery  RPAV lesion 85% stenosed  RPAV lesion is 85% stenosed.  Intervention  No interventions have been documented. Wall Motion  Resting               Left Heart  Left Ventricle The left ventricular size is normal. The left ventricular systolic function is normal. LV end diastolic pressure is normal. The left ventricular ejection fraction is 55-65% by visual estimate. No regional wall motion abnormalities.  Aortic Valve There is severe aortic valve stenosis. The aortic valve is calcified. There is restricted aortic valve motion.  Coronary Diagrams  Diagnostic Dominance: Right  Intervention  Implants   No implant documentation for this case.  Syngo Images  Show images for CARDIAC CATHETERIZATION  Images on Long Term Storage  Show images for [place], Por to Procedure Log  Procedure Log    Hemo Data   Most Recent Value  Fick Cardiac Output 4.29 L/min  Fick Cardiac Output Index 2.27 (L/min)/BSA  Aortic Mean Gradient 29.4 mmHg  Aortic Peak Gradient 27 mmHg  Aortic Valve Area 0.98  Aortic Value Area Index 0.52 cm2/BSA  RA A Wave 3 mmHg  RA V Wave 0 mmHg  RA Mean 0 mmHg  RV Systolic Pressure 23 mmHg  RV Diastolic Pressure -2 mmHg  RV EDP 2 mmHg  PA Systolic Pressure 26 mmHg  PA Diastolic Pressure 3 mmHg  PA Mean 12 mmHg  PW A Wave 8 mmHg  PW V Wave 6 mmHg  PW Mean 5 mmHg  AO Systolic Pressure [id number] mmHg  AO Diastolic Pressure 50 mmHg  AO Mean 79 mmHg  LV Systolic Pressure [id number] mmHg  LV  Diastolic Pressure 1 mmHg  LV EDP 3 mmHg  AOp Systolic Pressure [id number] mmHg  AOp Diastolic Pressure 54 mmHg  AOp Mean Pressure 86 mmHg  LVp Systolic Pressure 123XX123 mmHg  LVp Diastolic Pressure 2 mmHg  LVp EDP Pressure 5 mmHg  QP/QS 1  TPVR Index 5.27 HRUI  TSVR Index 33.84 HRUI  TPVR/TSVR Ratio 0.16     STS RISK CALCULATOR: CABG/AVR: Risk of Mortality: 5.128% Renal Failure: 4.218% Permanent Stroke: 4.279% Prolonged Ventilation: 12.261% DSW Infection: 0.159% Reoperation: 4.122% Morbidity or Mortality: 20.016% Short Length of Stay: 14.324% Long Length of Stay: 15.131%   ASSESSMENT AND PLAN: 1.  The patient has severe, stage D1 aortic stenosis associated with [place] Heart Association functional class III symptoms of chronic diastolic heart failure.  I have reviewed the natural history of aortic stenosis with the patient and their family members who are present today. We have discussed the limitations of medical therapy and the poor prognosis associated with symptomatic aortic stenosis. We have reviewed potential treatment options, including palliative medical therapy, conventional surgical aortic valve replacement, and transcatheter aortic valve replacement. We discussed treatment options in the context of the patient's specific comorbid medical conditions.    The patient's echocardiogram and cardiac catheterization images are personally reviewed and demonstrated to both the patient and his son.  His echocardiogram is notable for preserved LV systolic function with LVEF approximately 60%.  The aortic valve is calcified and restricted with peak and mean transvalvular gradients of 51 and 30 mmHg, respectively.  The calculated aortic valve area 0.7 cm and the dimensionless index is 0.21.  The patient's cardiac catheterization demonstrates diffuse calcific coronary artery disease with patency of the RCA, moderate nonobstructive distal left main stenosis, patency of the LAD with  moderate proximal vessel stenosis, and patency of the left circumflex with moderate mid vessel stenosis.  All of these findings are stable from his previous heart catheterization in 2018.  He has done reasonably well after hospitalization in 2018 for atrial fibrillation with RVR and acute systolic heart failure.  LV function has recovered with maintenance of normal sinus rhythm.  He now has developed progressive symptoms of weakness and shortness of breath over the last 3 to 4 months.  Echo and cardiac catheterization findings are reviewed  as above and suggest progressive aortic stenosis as the most likely etiology of his worsening symptoms.  After extensive discussion with the patient and his son, we agreed that it is appropriate to proceed with TAVR work-up which will include a gated cardiac CTA and a CTA of the chest, abdomen, and pelvis.  I discussed that TAVR procedure with them and demonstrated a procedural animation of transfemoral TAVR.  They understand the risks, potential benefit, and expected recovery from TAVR.  The patient will undergo formal surgical consultation once his CTA studies are completed as part of a multidisciplinary heart team approach to his care.  Deatra Markis 10/24/2019 11:34 AM     [place] [address]  [phone] (office) [phone] (fax)

## 2019-10-30 ENCOUNTER — Encounter: Payer: Self-pay | Admitting: Physician Assistant

## 2019-10-30 DIAGNOSIS — I350 Nonrheumatic aortic (valve) stenosis: Secondary | ICD-10-CM | POA: Insufficient documentation

## 2019-11-02 ENCOUNTER — Encounter (HOSPITAL_COMMUNITY): Payer: Self-pay

## 2019-11-02 ENCOUNTER — Other Ambulatory Visit: Payer: Self-pay

## 2019-11-02 ENCOUNTER — Ambulatory Visit (HOSPITAL_COMMUNITY)
Admission: RE | Admit: 2019-11-02 | Discharge: 2019-11-02 | Disposition: A | Discharge status: Home / Self Care | Payer: Medicare Other | Source: Ambulatory Visit | Attending: Cardiovascular Disease | Admitting: Cardiovascular Disease

## 2019-11-02 ENCOUNTER — Ambulatory Visit (HOSPITAL_BASED_OUTPATIENT_CLINIC_OR_DEPARTMENT_OTHER)
Admission: RE | Admit: 2019-11-02 | Discharge: 2019-11-02 | Disposition: A | Discharge status: Home / Self Care | Payer: Medicare Other | Source: Ambulatory Visit | Attending: Cardiovascular Disease | Admitting: Cardiovascular Disease

## 2019-11-02 ENCOUNTER — Ambulatory Visit (HOSPITAL_COMMUNITY): Admission: RE | Admit: 2019-11-02 | Payer: Medicare Other | Source: Ambulatory Visit

## 2019-11-02 DIAGNOSIS — Z952 Presence of prosthetic heart valve: Secondary | ICD-10-CM | POA: Diagnosis not present

## 2019-11-02 DIAGNOSIS — I350 Nonrheumatic aortic (valve) stenosis: Secondary | ICD-10-CM

## 2019-11-02 DIAGNOSIS — I77810 Thoracic aortic ectasia: Secondary | ICD-10-CM | POA: Diagnosis not present

## 2019-11-02 DIAGNOSIS — I700 Atherosclerosis of aorta: Secondary | ICD-10-CM | POA: Diagnosis not present

## 2019-11-02 MED ORDER — IOHEXOL 350 MG/ML SOLN
100.0000 mL | Freq: Once | INTRAVENOUS | Status: AC | PRN
  Administered 2019-11-02: 100 mL via INTRAVENOUS

## 2019-11-02 NOTE — Progress Notes (Signed)
Carotid duplex has been completed.   Preliminary results in CV Proc.   Abram Sander 11/02/2019 8:54 AM

## 2019-11-03 ENCOUNTER — Telehealth: Payer: Self-pay | Admitting: Cardiovascular Disease

## 2019-11-03 NOTE — Telephone Encounter (Signed)
I spoke with [name] and she is the pt's daughter who is helping him through the TAVR evaluation.  She said the pt has an appointment to receive his first covid vaccine on 2/22 and she would like to know if he is okay to proceed with this appointment. I advised that the pt can proceed with covid vaccine.

## 2019-11-03 NOTE — Telephone Encounter (Signed)
New Message    Bernard Kim is calling to speak with Bernard Kim, she says she has somethings to talk to her about    Please call

## 2019-11-09 ENCOUNTER — Institutional Professional Consult (permissible substitution): Payer: Medicare Other | Admitting: Surgery

## 2019-11-09 ENCOUNTER — Encounter: Payer: Self-pay | Admitting: Surgery

## 2019-11-09 ENCOUNTER — Ambulatory Visit: Payer: Medicare Other | Admitting: Cardiology

## 2019-11-09 ENCOUNTER — Ambulatory Visit: Payer: Medicare Other | Attending: Cardiovascular Disease | Admitting: Physical Therapy

## 2019-11-09 ENCOUNTER — Encounter: Payer: Self-pay | Admitting: Physical Therapy

## 2019-11-09 ENCOUNTER — Other Ambulatory Visit: Payer: Self-pay

## 2019-11-09 VITALS — BP 127/77 | HR 67 | Temp 97.7°F | Resp 16 | Ht 68.0 in | Wt 170.0 lb

## 2019-11-09 DIAGNOSIS — R2689 Other abnormalities of gait and mobility: Secondary | ICD-10-CM

## 2019-11-09 DIAGNOSIS — I350 Nonrheumatic aortic (valve) stenosis: Secondary | ICD-10-CM

## 2019-11-09 NOTE — Progress Notes (Signed)
Patient ID: SUFYAN SCHOOL, male   DOB: Aug 24, 1931, 86 y.o.   MRN: [id number]  [facility] CONSULTATION REPORT  Referring Provider is Richardo Priest, MD Primary Cardiologist is Shirlee More, MD PCP is Marco Collie, MD  Chief Complaint  Patient presents with  . Aortic Stenosis    TAVR eval, review all studies...execise study this afternoon    HPI:  The patient is an 86 year old gentleman with a history of hypertension, paroxysmal atrial fibrillation on Eliquis, and chronic combined systolic and diastolic congestive heart failure as well as aortic stenosis.  He was hospitalized in 2018 with atrial fibrillation with rapid ventricular response and acute heart failure.  He was noted to have an ejection fraction of 25 to 30% at that time with diffuse hypokinesis.  There was mild aortic stenosis with a mean gradient of 16 mmHg as well as moderate aortic insufficiency.  There was mild to moderate mitral regurgitation.  He underwent cardiac catheterization at that time which showed 40% left main stenosis, 60% LAD stenosis, insignificant disease in the left circumflex, and an 85% distal RCA stenosis.  He was noted to have elevated filling pressures with prominent V waves in his wedge tracing suggestive of mitral regurgitation.  His atrial fibrillation was converted with amiodarone and he has remained on amiodarone since.  Follow-up echocardiogram in 2019 showed his ejection fraction increased to 55%.  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  His daughter is with him today and reports that over the past few months he has been tired and feels weak.  He has exertional shortness of breath with low-level activity such as going up 1 flight of stairs.  He has had intermittent dizziness and had a syncopal spell a few weeks ago while getting up out of bed at night.  His  most recent echocardiogram and October 2020 showed an increase in the aortic valve mean gradient to 30 mmHg with a peak gradient of 51 mmHg.  Aortic valve area was measured at 0.67 by VTI.  There was mild aortic insufficiency.  Left ventricular ejection fraction was 60 to 65% with moderate LVH and diastolic dysfunction.  There was mild mitral regurgitation.  LV stroke-volume was measured at 56 cc with an index of 29.  He underwent repeat catheterization on 10/19/2019 which showed heavily calcified coronary arteries which did not look much different compared to his prior catheterization in 2018.  The mean gradient across aortic valve was measured at 29.4 mmHg with a valve area of 0.98 cm.  Cardiac index was 2.27.  Right heart pressures were low.  The patient is married and lives with his wife.  He has a daughter who is here today and his son who lives in [place].  Past Medical History:  Diagnosis Date  . Cancer ([place])    bladdre cancer  . Chronic combined systolic (congestive) and diastolic (congestive) heart failure ([place])   . Coronary artery disease   . History of bladder cancer   . History of TIA (transient ischemic attack)   . Hypertension   . PAF (paroxysmal atrial fibrillation) ([facility])    on Eliquis  . Severe aortic stenosis     Past Surgical History:  Procedure Laterality Date  . CARDIAC CATHETERIZATION    . HERNIA REPAIR    . JOINT REPLACEMENT    . PROSTATE SURGERY    .  RIGHT/LEFT HEART CATH AND CORONARY ANGIOGRAPHY N/A 09/03/2017   Procedure: RIGHT/LEFT HEART CATH AND CORONARY ANGIOGRAPHY;  Surgeon: Jolaine Artist, MD;  Location: [facility];  Service: Cardiovascular;  Laterality: N/A;  . RIGHT/LEFT HEART CATH AND CORONARY ANGIOGRAPHY N/A 10/19/2019   Procedure: RIGHT/LEFT HEART CATH AND CORONARY ANGIOGRAPHY;  Surgeon: Wellington Hampshire, MD;  Location: [facility];  Service: Cardiovascular;  Laterality: N/A;    Family History  Problem Relation Age of Onset  . Heart  attack Father   . Heart Problems Brother     Social History   Socioeconomic History  . Marital status: Married    Spouse name: Not on file  . Number of children: Not on file  . Years of education: Not on file  . Highest education level: Not on file  Occupational History  . Not on file  Tobacco Use  . Smoking status: Never Smoker  . Smokeless tobacco: Never Used  Substance and Sexual Activity  . Alcohol use: No  . Drug use: No  . Sexual activity: Not on file  Other Topics Concern  . Not on file  Social History Narrative  . Not on file   Social Determinants of Health   Financial Resource Strain: Low Risk   . Difficulty of Paying Living Expenses: Not hard at all  Food Insecurity: No Food Insecurity  . Worried About Charity fundraiser in the Last Year: Never true  . Ran Out of Food in the Last Year: Never true  Transportation Needs: No Transportation Needs  . Lack of Transportation (Medical): No  . Lack of Transportation (Non-Medical): No  Physical Activity: Inactive  . Days of Exercise per Week: 0 days  . Minutes of Exercise per Session: 0 min  Stress: No Stress Concern Present  . Feeling of Stress : Not at all  Social Connections: Slightly Isolated  . Frequency of Communication with Friends and Family: More than three times a week  . Frequency of Social Gatherings with Friends and Family: Never  . Attends Religious Services: More than 4 times per year  . Active Member of Clubs or Organizations: No  . Attends Archivist Meetings: Never  . Marital Status: Married  Human resources officer Violence: Not At Risk  . Fear of Current or Ex-Partner: No  . Emotionally Abused: No  . Physically Abused: No  . Sexually Abused: No    Current Outpatient Medications  Medication Sig Dispense Refill  . acetaminophen (TYLENOL 8 HOUR ARTHRITIS PAIN) 650 MG CR tablet Take 650 mg by mouth at bedtime.     Marland Kitchen acetaminophen (TYLENOL) 500 MG tablet Take 500 mg by mouth every 6 (six)  hours as needed for mild pain (out of arthritis).    Marland Kitchen amiodarone (PACERONE) 200 MG tablet Take 1 tablet (200 mg total) by mouth daily. 90 tablet 1  . apixaban (ELIQUIS) 2.5 MG TABS tablet Take 1 tablet (2.5 mg total) by mouth 2 (two) times daily.    . empagliflozin (JARDIANCE) 25 MG TABS tablet Take 25 mg by mouth daily.    Marland Kitchen ezetimibe (ZETIA) 10 MG tablet Take 1 tablet (10 mg total) by mouth daily. 30 tablet 3  . furosemide (LASIX) 80 MG tablet TAKE 1 TABLET ONCE DAILY. (Patient taking differently: Take 80 mg by mouth daily. ) 90 tablet 1  . Insulin Glargine (BASAGLAR KWIKPEN [place]) Inject 20 Units into the skin at bedtime.     Marland Kitchen losartan (COZAAR) 25 MG tablet TAKE  0.5 TABLET BY MOUTH DAILY (Patient taking differently: Take 12.5 mg by mouth daily. ) 30 tablet 3  . lovastatin (MEVACOR) 40 MG tablet Take 40 mg by mouth at bedtime.     . nitroGLYCERIN (NITROSTAT) 0.4 MG SL tablet DISSOLVE 1 TABLET UNDER THE TONGUE EVERY 5 MINUTES AS NEEDED FOR CHEST PAIN. (Patient taking differently: Place 0.4 mg under the tongue every 5 (five) minutes as needed for chest pain. ) 25 tablet 3  . triamcinolone cream (KENALOG) 0.1 % Apply 1 application topically 2 (two) times daily as needed (itching).      No current facility-administered medications for this visit.    Allergies  Allergen Reactions  . Spironolactone Rash      Review of Systems:   General:  normal appetite, + decreased energy, no weight gain, no weight loss, no fever  Cardiac:  no chest pain with exertion, no chest pain at rest, +SOB with low level exertion, no resting SOB, no PND, no orthopnea, no palpitations, + arrhythmia, + paroxysmal atrial fibrillation, no LE edema, + dizzy spells, + syncope  Respiratory:  + exertional shortness of breath, no home oxygen, no productive cough, no dry cough, no bronchitis, no wheezing, no hemoptysis, no asthma, no pain with inspiration or cough, no sleep apnea, no CPAP at night  GI:   no difficulty  swallowing, no reflux, no frequent heartburn, no hiatal hernia, no abdominal pain, no constipation, no diarrhea, no hematochezia, no hematemesis, no melena  GU:   no dysuria,  no frequency, no urinary tract infection, no hematuria, no enlarged prostate, no kidney stones, no kidney disease  Vascular:  no pain suggestive of claudication, no pain in feet, no leg cramps, no varicose veins, no DVT, no non-healing foot ulcer  Neuro:   no stroke, no TIA's, no seizures, no headaches, no temporary blindness one eye,  no slurred speech, no peripheral neuropathy, no chronic pain, + instability of gait, no memory/cognitive dysfunction  Musculoskeletal: + arthritis, no joint swelling, no myalgias, + difficulty walking with some balance problems, + reduced mobility   Skin:   no rash, + itching, no skin infections, no pressure sores or ulcerations  Psych:   no anxiety, no depression, no nervousness, no unusual recent stress  Eyes:   no blurry vision, no floaters, no recent vision changes, + wears glasses or contacts  ENT:   + hearing loss, no loose or painful teeth, + dentures  Hematologic:  + easy bruising, no abnormal bleeding, no clotting disorder, no frequent epistaxis  Endocrine:  + diabetes, does  check CBG's at home       Physical Exam:   BP 127/77 (BP Location: Right Arm, Patient Position: Sitting, Cuff Size: Normal)   Pulse 67   Temp 97.7 F (36.5 C)   Resp 16   Ht 5\' 8"  (1.727 m)   Wt 170 lb (77.1 kg)   SpO2 95% Comment: RA  BMI 25.85 kg/m   General:  Elderly but  well-appearing  HEENT:  Unremarkable, NCAT, PERLA, EOMI  Neck:   no JVD, no bruits, no adenopathy   Chest:   clear to auscultation, symmetrical breath sounds, no wheezes, no rhonchi   CV:   RRR, grade lll/VI crescendo/decrescendo murmur heard best at RSB,  no diastolic murmur  Abdomen:  soft, non-tender, no masses or organomegaly  Extremities:  warm, well-perfused, pulses palpable at ankles, no LE  edema  Rectal/GU  Deferred  Neuro:   Grossly non-focal and symmetrical throughout  Skin:  Clean and dry, no rashes, no breakdown   Diagnostic Tests:  ECHOCARDIOGRAM REPORT       Patient Name:  Bernard Kim Date of Exam: 06/22/2019  Medical Rec #: [id number]  Height:    68.0 in  Accession #:  [id number]  Weight:    173.0 lb  Date of Birth: Jun 14, 1931  BSA:     1.92 m  Patient Age:  49 years   BP:      152/70 mmHg  Patient Gender: M      HR:      69 bpm.  Exam Location: [facility]   Procedure: 2D Echo   Indications:  R07.9* Chest pain, unspecified    History:    Patient has prior history of Echocardiogram examinations,  most         recent 10/18/2018. Aortic Valve Disease  Signs/Symptoms:Paroxysmal         A-fib Risk Factors:Hypertension.    Sonographer:  Luane School  Referring Phys: [phone] [place]    1. Left ventricular ejection fraction, by visual estimation, is 60 to  65%. The left ventricle has normal function. Normal left ventricular size.  There is moderately increased left ventricular hypertrophy.  2. Left ventricular diastolic Doppler parameters are consistent with  impaired relaxation pattern of LV diastolic filling.  3. Global right ventricle has normal systolic function.The right  ventricular size is normal. No increase in right ventricular wall  thickness.  4. Left atrial size was mild-moderately dilated.  5. Right atrial size was normal.  6. The mitral valve is normal in structure. Mild mitral valve  regurgitation. No evidence of mitral stenosis.  7. The tricuspid valve is normal in structure. Tricuspid valve  regurgitation was not visualized by color flow Doppler.  8. The aortic valve is normal in structure. Aortic valve regurgitation is  mild by color flow Doppler. Moderate aortic valve stenosis.  9. The pulmonic valve was normal in structure. Pulmonic valve   regurgitation is not visualized by color flow Doppler.  10. Normal pulmonary artery systolic pressure.  11. The inferior vena cava is normal in size with greater than 50%  respiratory variability, suggesting right atrial pressure of 3 mmHg.   FINDINGS  Left Ventricle: Left ventricular ejection fraction, by visual estimation,  is 60 to 65%. The left ventricle has normal function. There is moderately  increased left ventricular hypertrophy. Normal left ventricular size.  Spectral Doppler shows Left  ventricular diastolic Doppler parameters are consistent with impaired  relaxation pattern of LV diastolic filling.   Right Ventricle: The right ventricular size is normal. No increase in  right ventricular wall thickness. Global RV systolic function is has  normal systolic function. The tricuspid regurgitant velocity is 2.51 m/s,  and with an assumed right atrial pressure  of 3 mmHg, the estimated right ventricular systolic pressure is normal at  28.1 mmHg.   Left Atrium: Left atrial size was mild-moderately dilated.   Right Atrium: Right atrial size was normal in size   Pericardium: There is no evidence of pericardial effusion.   Mitral Valve: The mitral valve is normal in structure. There is mild  thickening of the mitral valve leaflet(s). No evidence of mitral valve  stenosis by observation. Mild mitral valve regurgitation.   Tricuspid Valve: The tricuspid valve is normal in structure. Tricuspid  valve regurgitation was not visualized by color flow Doppler.   Aortic Valve: The aortic valve is normal in structure. Aortic valve  regurgitation is mild  by color flow Doppler. Aortic regurgitation PHT  measures 572 msec. Moderate aortic stenosis is present. Aortic valve mean  gradient measures 30.0 mmHg. Aortic valve  peak gradient measures 51.0 mmHg. Aortic valve area, by VTI measures 0.67  cm.   Pulmonic Valve: The pulmonic valve was normal in structure. Pulmonic valve   regurgitation is not visualized by color flow Doppler.   Aorta: The aortic root, ascending aorta and aortic arch are all  structurally normal, with no evidence of dilitation or obstruction.   Venous: The inferior vena cava is normal in size with greater than 50%  respiratory variability, suggesting right atrial pressure of 3 mmHg.   IAS/Shunts: No atrial level shunt detected by color flow Doppler. No  ventricular septal defect is seen or detected. There is no evidence of an  atrial septal defect.      LEFT VENTRICLE  PLAX 2D  LVIDd:     5.00 cm Diastology  LVIDs:     3.80 cm LV e' lateral:  2.72 cm/s  LV PW:     1.70 cm LV E/e' lateral: 18.4  LV IVS:    1.80 cm LV e' medial:  3.15 cm/s  LVOT diam:   2.00 cm LV E/e' medial: 15.9  LV SV:     56 ml  LV SV Index:  28.81  LVOT Area:   3.14 cm     RIGHT VENTRICLE     IVC  TAPSE (M-mode): 2.2 cm IVC diam: 1.30 cm   LEFT ATRIUM       Index    RIGHT ATRIUM      Index  LA diam:    4.50 cm 2.34 cm/m RA Area:   16.30 cm  LA Vol (A2C):  73.5 ml 38.25 ml/m RA Volume:  33.30 ml 17.33 ml/m  LA Vol (A4C):  68.8 ml 35.80 ml/m  LA Biplane Vol: 71.6 ml 37.26 ml/m  AORTIC VALVE  AV Area (Vmax):  0.73 cm  AV Area (Vmean):  0.73 cm  AV Area (VTI):   0.67 cm  AV Vmax:      357.00 cm/s  AV Vmean:     259.000 cm/s  AV VTI:      0.898 m  AV Peak Grad:   51.0 mmHg  AV Mean Grad:   30.0 mmHg  LVOT Vmax:     82.80 cm/s  LVOT Vmean:    59.900 cm/s  LVOT VTI:     0.191 m  LVOT/AV VTI ratio: 0.21  AI PHT:      572 msec    AORTA  Ao Root diam: 3.70 cm  Ao Asc diam: 3.80 cm   MV E velocity: 50.00 cm/s 103 cm/s TRICUSPID VALVE                   TR Peak grad:  25.1 mmHg                   TR Vmax:    255.00 cm/s                     SHUNTS                    Systemic VTI: 0.19 m                   Systemic Diam: 2.00 cm     Jyl Heinz MD  Electronically signed by Jyl Heinz MD  Signature Date/Time: 06/22/2019/12:08:00 PM  Physicians Panel Physicians Referring Physician Case Authorizing Physician  Wellington Hampshire, MD (Primary)       Procedures RIGHT/LEFT HEART CATH AND CORONARY ANGIOGRAPHY     Conclusion Mid LM to Dist LM lesion is 40% stenosed.  Prox LAD lesion is 60% stenosed.  Ost 1st Diag to 1st Diag lesion is 30% stenosed.  Prox Cx to Mid Cx lesion is 30% stenosed.  Prox RCA lesion is 40% stenosed.  Mid RCA lesion is 30% stenosed.  Dist RCA lesion is 60% stenosed.  RPAV lesion is 85% stenosed.  The left ventricular systolic function is normal.  LV end diastolic pressure is normal.  The left ventricular ejection fraction is 55-65% by visual estimate.  There is severe aortic valve stenosis. 1. Heavily calcified coronary arteries with moderate left main stenosis, moderate proximal LAD stenosis and significant stenosis in the right posterior AV groove. The coronary arteries do not look different from most recent catheterization in 2018.  2. Normal LV systolic function.  3. Right heart catheterization showed low filling pressures with RA pressure of 1 mmHg and pulmonary capillary wedge pressure of 5, normal pulmonary pressure at 26/3 and normal cardiac output at 4.29 with a cardiac index of 2.27.  4. Severe aortic stenosis with a mean gradient of 29.4 mmHg and valve area of 0.98.  Recommendations:  Continue evaluation for TAVR. I do not think coronary revascularization is needed.  Eliquis can be resumed tomorrow.  I decreased the dose of furosemide to 40 mg once daily.            Indications Nonrheumatic aortic valve stenosis [I35.0 (ICD-10-CM)]     Procedural Details Technical Details Procedural Details: The pre-existing IV in the right antecubital vein was exchanged under  sterile fashion to a slender sheath. The right wrist was prepped, draped, and anesthetized with 1% lidocaine. Using the modified Seldinger technique, a 5 French sheath was introduced into the right radial artery. 3 mg of verapamil was administered through the sheath, weight-based unfractionated heparin was administered intravenously. Right heart catheterization was performed using a 5 French Swan-Ganz catheter. Cardiac output was calculated by the Fick method. A Jackie catheter was used for selective coronary angiography and left ventriculography. This catheter was used to cross the aortic valve with a straight wire with some difficulty. A JL 3.5 catheter was used to engage the left main coronary artery . catheter exchanges were performed over an exchange length guidewire. There were no immediate procedural complications. A TR band was used for radial hemostasis at the completion of the procedure. The patient was transferred to the post catheterization recovery area for further monitoring. Estimated blood loss <50 mL.   During this procedure medications were administered to achieve and maintain moderate conscious sedation while the patient's heart rate, blood pressure, and oxygen saturation were continuously monitored and I was present face-to-face 100% of this time.     Medications (Filter: Administrations occurring from 10/19/19 1043 to 10/19/19 1142)  Continuous medications are totaled by the amount administered until 10/19/19 1142.  Heparin (Porcine) in NaCl 1000-0.9 UT/500ML-% SOLN (mL) Total volume: 500 mL  Date/Time   Rate/Dose/Volume Action  10/19/19 1050  500 mL Given  Heparin (Porcine) in NaCl 1000-0.9 UT/500ML-% SOLN (mL) Total volume: 500 mL  Date/Time   Rate/Dose/Volume Action  10/19/19 1050  500 mL Given  fentaNYL (SUBLIMAZE) injection (mcg) Total dose: 25 mcg  Date/Time   Rate/Dose/Volume Action  10/19/19 1051  25 mcg Given  midazolam (VERSED) injection (mg) Total dose:  1 mg   Date/Time   Rate/Dose/Volume Action  10/19/19 1052  1 mg Given  lidocaine (PF) (XYLOCAINE) 1 % injection (mL) Total volume: 3 mL  Date/Time   Rate/Dose/Volume Action  10/19/19 1103  1 mL Given  1109  2 mL Given  Radial Cocktail/Verapamil only (mL) Total volume: 10 mL  Date/Time   Rate/Dose/Volume Action  10/19/19 1111  10 mL Given  heparin injection (Units) Total dose: 4,000 Units  Date/Time   Rate/Dose/Volume Action  10/19/19 1116  4,000 Units Given  iohexol (OMNIPAQUE) 350 MG/ML injection (mL) Total volume: 75 mL  Date/Time   Rate/Dose/Volume Action  10/19/19 1133  75 mL Given     Sedation Time Sedation Time Physician-1: 41 minutes 21 seconds        Contrast Medication Name Total Dose  iohexol (OMNIPAQUE) 350 MG/ML injection 75 mL     Radiation/Fluoro Fluoro time: 11.1 (min)  DAP: [id number] (mGycm2)  Cumulative Air Kerma: [id number] (mGy)        Coronary Findings Diagnostic Dominance: Right  Left Main  Mid LM to Dist LM lesion 40% stenosed  Mid LM to Dist LM lesion is 40% stenosed.   Left Anterior Descending  Prox LAD lesion 60% stenosed  Prox LAD lesion is 60% stenosed.   First Diagonal Branch  Ost 1st Diag to 1st Diag lesion 30% stenosed  Ost 1st Diag to 1st Diag lesion is 30% stenosed.   Left Circumflex  Prox Cx to Mid Cx lesion 30% stenosed  Prox Cx to Mid Cx lesion is 30% stenosed.   Right Coronary Artery  Prox RCA lesion 40% stenosed  Prox RCA lesion is 40% stenosed.  Mid RCA lesion 30% stenosed  Mid RCA lesion is 30% stenosed. The lesion was previously treated.  Dist RCA lesion 60% stenosed  Dist RCA lesion is 60% stenosed.   Right Posterior Atrioventricular Artery  RPAV lesion 85% stenosed  RPAV lesion is 85% stenosed.  Intervention No interventions have been documented.                      Wall Motion Resting               All segments of the heart are normal.               Left Heart Left Ventricle The left  ventricular size is normal. The left ventricular systolic function is normal. LV end diastolic pressure is normal. The left ventricular ejection fraction is 55-65% by visual estimate. No regional wall motion abnormalities.   Aortic Valve There is severe aortic valve stenosis. The aortic valve is calcified. There is restricted aortic valve motion.     Coronary Diagrams Diagnostic Dominance: Right  &&&&&  Intervention      Implants  No implant documentation for this case.      [employer] Images Link to Procedure Log  Show images for CARDIAC CATHETERIZATION Procedure Log     Images on Long Term Storage   Show images for [place], [place]         Hemo Data   Most Recent Value  Fick Cardiac Output 4.29 L/min  Fick Cardiac Output Index 2.27 (L/min)/BSA  Aortic Mean Gradient 29.4 mmHg  Aortic Peak Gradient 27 mmHg  Aortic Valve Area 0.98  Aortic Value Area Index 0.52 cm2/BSA  RA A Wave 3 mmHg  RA V Wave 0 mmHg  RA Mean 0 mmHg  RV Systolic Pressure 23 mmHg  RV Diastolic Pressure -  2 mmHg  RV EDP 2 mmHg  PA Systolic Pressure 26 mmHg  PA Diastolic Pressure 3 mmHg  PA Mean 12 mmHg  PW A Wave 8 mmHg  PW V Wave 6 mmHg  PW Mean 5 mmHg  AO Systolic Pressure [id number] mmHg  AO Diastolic Pressure 50 mmHg  AO Mean 79 mmHg  LV Systolic Pressure [id number] mmHg  LV Diastolic Pressure 1 mmHg  LV EDP 3 mmHg  AOp Systolic Pressure [id number] mmHg  AOp Diastolic Pressure 54 mmHg  AOp Mean Pressure 86 mmHg  LVp Systolic Pressure 123XX123 mmHg  LVp Diastolic Pressure 2 mmHg  LVp EDP Pressure 5 mmHg  QP/QS 1  TPVR Index 5.27 HRUI  TSVR Index 33.84 HRUI  TPVR/TSVR Ratio 0.16    ADDENDUM REPORT: 11/03/2019 21:44  CLINICAL DATA:  86 year old male with severe aortic stenosis being evaluated for a TAVR procedure.  EXAM: Cardiac TAVR CT  TECHNIQUE: The patient was scanned on a [employer]. A 120 kV retrospective scan was triggered in the descending thoracic aorta at 111 HU's. Gantry rotation  speed was 250 msecs and collimation was .6 mm. No beta blockade or nitro were given. The 3D data set was reconstructed in 5% intervals of the R-R cycle. Systolic and diastolic phases were analyzed on a dedicated work station using MPR, MIP and VRT modes. The patient received 80 cc of contrast.  FINDINGS: Aortic Valve: Trileaflet aortic valve with severely calcified leaflets and asymmetric calcifications extending into the LVOT under the non-coronary leaflet.  Aorta: Upper normal size of the ascending aorta with maximum diameter 40 mm, mild to moderate diffuse atherosclerotic plaque and calcifications. No dissection.  Sinotubular Junction: 36 x 34 mm  Ascending Thoracic Aorta: 40 x 40 mm  Aortic Arch: 27 x 27 mm  Descending Thoracic Aorta: 27 x 25 mm  Sinus of Valsalva Measurements:  Non-coronary: 35 mm  Right -coronary: 33 mm  Left -coronary: 34 mm  Coronary Artery Height above Annulus:  Left Main: 19 mm  Right Coronary: 22 mm  Virtual Basal Annulus Measurements:  Maximum/Minimum Diameter: 28.1 x 22.8 mm  Mean Diameter: 25.6 mm  Perimeter: 82.2 mm  Area: 513 mm2  Optimum Fluoroscopic Angle for Delivery: LAO 17 CAU 13  IMPRESSION: 1. Trileaflet aortic valve with severely calcified leaflets and asymmetric calcifications extending into the LVOT under the non-coronary leaflet. Aortic valve calcium score is 3336 consistent with severe aortic stenosis. Annular measurements suitable for delivery of a 26 mm Edwards-SAPIEN 3 valve.  2. Sufficient coronary to annulus distance.  3. Optimum Fluoroscopic Angle for Delivery:   LAO 17 CAU 13.  4. No thrombus in the left atrial appendage.   Electronically Signed   By: Ena Dawley   On: 11/03/2019 21:44   Addended by Dorothy Spark, MD on 11/03/2019 9:47 PM    Study Result  EXAM: OVER-READ INTERPRETATION  CT CHEST  The following report is an over-read performed by  radiologist Dr. Vinnie Langton of [facility], [place] on 11/02/2019. This over-read does not include interpretation of cardiac or coronary anatomy or pathology. The coronary calcium score/coronary CTA interpretation by the cardiologist is attached.  COMPARISON:  Chest CTA 08/30/2017.  FINDINGS: Extracardiac findings will be described separately under dictation for contemporaneously obtained CTA chest, abdomen and pelvis.  IMPRESSION: Please see separate dictation for contemporaneously obtained CTA chest, abdomen and pelvis dated 11/02/2019 for full description of relevant extracardiac findings.  Electronically Signed: By: Vinnie Langton M.D. On: 11/02/2019 10:32  CLINICAL DATA:  87 year old male with history of severe aortic stenosis. Preprocedural study prior to potential transcatheter aortic valve replacement (TAVR) procedure.  EXAM: CT ANGIOGRAPHY CHEST, ABDOMEN AND PELVIS  TECHNIQUE: Multidetector CT imaging through the chest, abdomen and pelvis was performed using the standard protocol during bolus administration of intravenous contrast. Multiplanar reconstructed images and MIPs were obtained and reviewed to evaluate the vascular anatomy.  CONTRAST:  169mL OMNIPAQUE IOHEXOL 350 MG/ML SOLN  COMPARISON:  Chest CTA 08/30/2017.  FINDINGS: Comment: Today's study is suboptimal related to poor contrast bolus (which may be related to reduced cardiac output).  CTA CHEST FINDINGS  Cardiovascular: Heart size is mildly enlarged. There is no significant pericardial fluid, thickening or pericardial calcification. There is aortic atherosclerosis, as well as atherosclerosis of the great vessels of the mediastinum and the coronary arteries, including calcified atherosclerotic plaque in the left main, left anterior descending, left circumflex and right coronary arteries. Severe thickening calcification of the aortic valve, compatible with reported  clinical history of severe aortic stenosis. Ectasia of the ascending thoracic aorta (4.3 cm in diameter).  Mediastinum/Lymph Nodes: No pathologically enlarged mediastinal or hilar lymph nodes. Esophagus is unremarkable in appearance. No axillary lymphadenopathy.  Lungs/Pleura: No acute consolidative airspace disease. No pleural effusions. No definite suspicious appearing pulmonary nodules or masses are noted.  Musculoskeletal/Soft Tissues: There are no aggressive appearing lytic or blastic lesions noted in the visualized portions of the skeleton.  CTA ABDOMEN AND PELVIS FINDINGS  Hepatobiliary: 2 liver lesions poorly characterized on today's early arterial phase examination. The largest of these is in segment 4A of the liver (axial image 73 of series 11) measuring 5.0 x 4.3 cm which is centrally very low attenuation, but demonstrates a thick peripheral rim of intermediate to higher attenuation. No intra or extrahepatic biliary ductal dilatation. Gallbladder is normal in appearance.  Pancreas: No pancreatic mass. No pancreatic ductal dilatation. No pancreatic or peripancreatic fluid collections or inflammatory changes.  Spleen: Unremarkable.  Adrenals/Urinary Tract: 1.8 cm low-attenuation lesion in the upper pole the left kidney, compatible with a simple cyst. Subcentimeter low-attenuation lesions noted elsewhere in both kidneys, too small to definitively characterize, but statistically likely to represent tiny cysts. No suspicious renal lesions. Bilateral adrenal glands are normal in appearance. No hydroureteronephrosis. Urinary bladder is unremarkable in appearance.  Stomach/Bowel: Normal appearance of the stomach. No pathologic dilatation of small bowel or colon. Numerous colonic diverticulae are noted, particularly in the sigmoid colon, without surrounding inflammatory changes to suggest an acute diverticulitis at this time. Normal  appendix.  Vascular/Lymphatic: Severe aortic atherosclerosis, with vascular findings and measurements pertinent to potential TAVR procedure, as detailed above. Complex atheromatous plaque in the distal infrarenal abdominal aorta with internal vascular webs and multiple channels best appreciated at axial image 131 of series 11, which could provide challenges for intravascular procedures. No lymphadenopathy noted in the abdomen or pelvis.  Reproductive: Status post radical prostatectomy.  Other: No significant volume of ascites.  No pneumoperitoneum.  Musculoskeletal: There are no aggressive appearing lytic or blastic lesions noted in the visualized portions of the skeleton.  VASCULAR MEASUREMENTS PERTINENT TO TAVR:  AORTA:  Minimal Aortic Diameter-1.2 x 1.5 mm  Severity of Aortic Calcification-moderate  RIGHT PELVIS:  Right Common Iliac Artery -  Minimal Diameter-5.4 x 4.4 mm  Tortuosity-mild  Calcification-moderate  Right External Iliac Artery -  Minimal Diameter-8.8 x 6.7 mm  Tortuosity-moderate to severe  Calcification-mild  Right Common Femoral Artery -  Minimal Diameter-7.3 x 7.2 mm  Tortuosity-mild  Calcification-mild  LEFT PELVIS:  Left Common Iliac Artery -  Minimal Diameter-8.5 x 5.8 mm  Tortuosity-mild  Calcification-moderate  Left External Iliac Artery -  Minimal Diameter-9.1 x 7.2 mm  Tortuosity-severe  Calcification-mild  Left Common Femoral Artery -  Minimal Diameter-6.2 x 6.0 mm  Tortuosity-mild  Calcification-mild  Review of the MIP images confirms the above findings.  IMPRESSION: 1. Vascular findings and measurements pertinent to potential TAVR procedure, as detailed above. In addition to those findings, please take note of the complex atheromatous plaque in the distal infrarenal abdominal aorta where there are multiple vascular webs which divide the aortic lumen into multiple  channels. This may have implications for intravascular procedures. 2. Severe thickening and calcification of the aortic valve, compatible with the reported clinical history of severe aortic stenosis. 3. Indeterminate liver lesions. Follow-up evaluation with nonemergent abdominal MRI with and without IV gadolinium is recommended in the near future to better characterize these findings and exclude malignancy. 4. Aortic atherosclerosis, in addition to left main and 3 vessel coronary artery disease. 5. 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 Recommend annual imaging followup by CTA or MRA. This recommendation follows 2010 ACCF/AHA/AATS/ACR/ASA/SCA/SCAI/SIR/STS/SVM Guidelines for the Diagnosis and Management of Patients with Thoracic Aortic Disease. Circulation. 2010; [id number]. Aortic aneurysm NOS (ICD10-I71.9). 6. Colonic diverticulosis without evidence of acute diverticulitis at this time. 7. Additional incidental findings, as above.   Electronically Signed   By: Vinnie Langton M.D.   On: 11/02/2019 12:11  STS RISK CALCULATOR: CABG/AVR: Risk of Mortality: 5.128% Renal Failure: 4.218% Permanent Stroke: 4.279% Prolonged Ventilation: 12.261% DSW Infection: 0.159% Reoperation: 4.122% Morbidity or Mortality: 20.016% Short Length of Stay: 14.324% Long Length of Stay: 15.131%  Impression:  This 86 year old gentleman has stage D, severe, symptomatic aortic stenosis with [place] Heart Association class III symptoms of exertional fatigue and shortness of breath consistent with chronic diastolic heart failure.  He has also had frequent episodes of dizziness and one episode of syncope.  I have personally reviewed his 2D echocardiogram, cardiac catheterization, and CTA studies.  His echocardiogram shows a calcified and restricted aortic valve with a mean transvalvular gradient of 30 mmHg and a peak gradient of 51 mmHg.  Aortic valve area was measured at  0.67 cm.  Dimensionless index was 0.21.  Cardiac catheterization shows moderate diffuse calcific coronary disease which can be treated medically.  There is been no significant change from his previous catheterization in 2018.  He has had no anginal symptoms.  I agree that aortic valve replacement is indicated in this patient for relief of his symptoms and to prevent progressive left ventricular deterioration.  Given his age I think transcatheter aortic valve replacement would be the best treatment for him.  His gated cardiac CTA shows anatomy suitable for transcatheter aortic valve replacement using a SAPIEN 3 valve.  His abdominal and pelvic CTA shows complex atheromatous plaque in the distal infrarenal abdominal aorta with multiple vascular webs that divide the aortic lumen into multiple channels.  There is some calcium within the center of the aorta suggesting that this is a localized aortic dissection.  I think this would significantly increase the risk of transfemoral insertion and therefore alternative access is indicated.  He appears to have adequate left subclavian access for insertion of a 14 or 16 French sheath.  He does have some enlargement of the ascending aorta with a maximum diameter of 4.3 cm.  The patient and his daughter were counseled at length regarding treatment alternatives for management  of severe symptomatic aortic stenosis. The risks and benefits of surgical intervention has been discussed in detail. Long-term prognosis with medical therapy was discussed. Alternative approaches such as conventional surgical aortic valve replacement, transcatheter aortic valve replacement, and palliative medical therapy were compared and contrasted at length. This discussion was placed in the context of the patient's own specific clinical presentation and past medical history. All of their questions have been addressed.   Following the decision to proceed with transcatheter aortic valve replacement, a  discussion was held regarding what types of management strategies would be attempted intraoperatively in the event of life-threatening complications, including whether or not the patient would be considered a candidate for the use of cardiopulmonary bypass and/or conversion to open sternotomy for attempted surgical intervention.  He is in reasonably good condition for his age and has been relatively active until recently so I would consider him a candidate for emergent sternotomy if needed to manage any intraoperative complications.    The patient has been advised of a variety of complications that might develop including but not limited to risks of death, stroke, paravalvular leak, aortic dissection or other major vascular complications, aortic annulus rupture, device embolization, cardiac rupture or perforation, mitral regurgitation, acute myocardial infarction, arrhythmia, heart block or bradycardia requiring permanent pacemaker placement, congestive heart failure, respiratory failure, renal failure, pneumonia, infection, other late complications related to structural valve deterioration or migration, or other complications that might ultimately cause a temporary or permanent loss of functional independence or other long term morbidity. The patient provides full informed consent for the procedure as described and all questions were answered.     Plan:  He will be scheduled for left subclavian transcatheter aortic valve replacement on Tuesday, 11/15/2019.  He has been instructed to stop his Eliquis as of today in preparation for surgery.  I spent 60 minutes performing this consultation and > 50% of this time was spent face to face counseling and coordinating the care of this patient's severe symptomatic aortic stenosis.     Gaye Pollack, MD 11/09/2019 2:24 PM

## 2019-11-09 NOTE — Therapy (Signed)
[place], [place], [address] Phone: [phone]   Fax:  [phone]  Physical Therapy Evaluation  Bernard Kim Details  Name: Bernard Kim MRN: [id number] Date of Birth: January 06, 1931 Referring Provider (Bernard Kim): Sherren Mocha, MD   Encounter Date: 11/09/2019  Bernard Kim End of Session - 11/09/19 1406    Visit Number  1    Number of Visits  1    Date for Bernard Kim Re-Evaluation  11/09/19    Bernard Kim Start Time  1400    Bernard Kim Stop Time  1428    Bernard Kim Time Calculation (min)  28 min    Activity Tolerance  Bernard Kim tolerated treatment well    Behavior During Therapy  [facility] for tasks assessed/performed       Past Medical History:  Diagnosis Date  . Cancer ([place])    bladdre cancer  . Chronic combined systolic (congestive) and diastolic (congestive) heart failure ([place])   . Coronary artery disease   . History of bladder cancer   . History of TIA (transient ischemic attack)   . Hypertension   . PAF (paroxysmal atrial fibrillation) ([facility])    on Eliquis  . Severe aortic stenosis     Past Surgical History:  Procedure Laterality Date  . CARDIAC CATHETERIZATION    . HERNIA REPAIR    . JOINT REPLACEMENT    . PROSTATE SURGERY    . RIGHT/LEFT HEART CATH AND CORONARY ANGIOGRAPHY N/A 09/03/2017   Procedure: RIGHT/LEFT HEART CATH AND CORONARY ANGIOGRAPHY;  Surgeon: Jolaine Artist, MD;  Location: [facility];  Service: Cardiovascular;  Laterality: N/A;  . RIGHT/LEFT HEART CATH AND CORONARY ANGIOGRAPHY N/A 10/19/2019   Procedure: RIGHT/LEFT HEART CATH AND CORONARY ANGIOGRAPHY;  Surgeon: Wellington Hampshire, MD;  Location: [facility];  Service: Cardiovascular;  Laterality: N/A;    There were no vitals filed for this visit.   Subjective Assessment - 11/09/19 1405    Subjective  Bernard Kim is a 86 y.o M with CC of SOB and fatigue which started 3 years ago and has progressively worsened inthe last 3 months. Bernard Kim reports no other pain or issues.    Bernard Kim Stated Goals  to get heart better    Currently in Pain?  No/denies         [facility] Bernard Kim Assessment - 11/09/19 1359      Assessment   Medical Diagnosis  Severe Aortic Stenosis    Referring Provider (Bernard Kim)  Sherren Mocha, MD    Onset Date/Surgical Date  --   3 years    Hand Dominance  Right      Precautions   Precautions  None      Restrictions   Weight Bearing Restrictions  No      Balance Screen   Has the Bernard Kim fallen in the past 6 months  Yes    How many times?  1    Has the Bernard Kim had a decrease in activity level because of a fear of falling?   No    Is the Bernard Kim reluctant to leave their home because of a fear of falling?   No      Home Environment   Living Environment  Private residence    Living Arrangements  Spouse/significant other    Available Help at Discharge  Family    Type of [place] Access  Level entry    [place]  Two level    Alternate Level Stairs-Number of  Steps  14    Alternate Level Stairs-Rails  Can reach both    Home Equipment  Walker - 2 wheels      ROM / Strength   AROM / PROM / Strength  AROM;Strength      AROM   Overall AROM   Within functional limits for tasks performed      Strength   Overall Strength  Within functional limits for tasks performed    Strength Assessment Site  Hand    Right Hand Grip (lbs)  67    Left Hand Grip (lbs)  53      Ambulation/Gait   Gait Pattern  Decreased step length - right;Decreased stride length;Step-through pattern;Trunk flexed    Gait Comments  Bernard Kim ambulated 370 ft in 2:32 requiring 46 second break  O2 96% and HR 77.  he was able to walk 199 ft for remainder of the test       [facility] Pre-Surgical Assessment - 11/09/19 1359    5 Meter Walk Test- trial 1  5 sec    5 Meter Walk Test- trial 2  4 sec.     5 Meter Walk Test- trial 3  4 sec.    5 meter walk test average  4.33 sec    4 Stage Balance Test tolerated for:   10 sec.    4 Stage Balance Test Position  4    Sit To Stand Test-  trial 1  15 sec.    ADL/IADL Independent with:  Bathing;Dressing;Meal prep;Finances;Yard work    ADL/IADL Therapist, sports Index  Vulnerable    6 Minute Walk- Baseline  yes    BP (mmHg)  129/69    HR (bpm)  58    02 Sat (%RA)  96 %    Modified Borg Scale for Dyspnea  0- Nothing at all    Perceived Rate of Exertion (Borg)  11- Fairly light    6 Minute Walk Post Test  yes    BP (mmHg)  145/74    HR (bpm)  75    02 Sat (%RA)  96 %    Modified Borg Scale for Dyspnea  3- Moderate shortness of breath or breathing difficulty    Perceived Rate of Exertion (Borg)  14-    Aerobic Endurance Distance Walked  569    Endurance additional comments  Bernard Kim is 58.41 % limited compared to age related               Objective measurements completed on examination: See above findings.                           Plan - 11/09/19 1358    Clinical Impression Statement  see assessment in note    Stability/Clinical Decision Making  Stable/Uncomplicated    Clinical Decision Making  Low    Bernard Kim Frequency  One time visit    Bernard Kim Next Visit Plan  PRE-TAVR evaluation      Clinical Impression Statement: Bernard Kim is a 86 yo M presenting to OP Bernard Kim for evaluation prior to possible TAVR surgery due to severe aortic stenosis. Bernard Kim reports onset of SOB and general fatigue approximately 3-4 months ago. Symptoms are limiting endurance. Bernard Kim presents with good ROM and strength, good balance and is assessed as low at high fall risk 4 stage balance test, good walking speed and limited aerobic endurance per 6 minute walk test. Bernard Kim ambulated 370 ft in 2:32 requiring 46 second break  O2 96% and HR 77.  he was able to walk 199 ft for remainder of the test on room air. Bernard Kim reported 3/10 shortness of breath on modified scale for dyspnea.  Bernard Kim ambulated a total of 569 feet in 6 minute walk. SOB, fatigue and R ankle pain increased significantly with 6 minute walk test. Based on the Short Physical Performance Battery, Bernard Kim has a  frailty rating of 10/12 with </= 5/12 considered frail.   Bernard Kim demonstrated the following deficits and impairments:     Visit Diagnosis: Other abnormalities of gait and mobility     Problem List Bernard Kim Active Problem List   Diagnosis Date Noted  . Severe aortic stenosis   . On amiodarone therapy 06/20/2019  . CKD (chronic kidney disease) stage 3, GFR 30-59 ml/min 06/18/2019  . Aortic stenosis 10/12/2018  . Systolic congestive heart failure ([place]) 10/12/2018  . Transaminitis   . Atrial fibrillation with RVR ([place])   . Dyspnea 08/31/2017  . Acute CHF (congestive heart failure) ([place]) 08/31/2017  . Pulmonary embolism ([place]) 08/31/2017  . Apneic episode 08/31/2017  . Paroxysmal A-fib ([place]) 08/31/2017  . Old MI (myocardial infarction) 08/05/2017  . DOE (dyspnea on exertion) 08/05/2017  . Malignant tumor of prostate ([place]) 07/02/2015  . Malignant neoplasm of prostate ([place]) 07/02/2015  . PVC (premature ventricular contraction) 06/06/2015  . Coronary artery disease involving native coronary artery of native heart with angina pectoris ([place]) 04/09/2015  . Nonrheumatic aortic valve insufficiency 04/09/2015  . Non-rheumatic mitral regurgitation 04/09/2015  . Malignant neoplasm of lateral wall of urinary bladder ([place]) 01/01/2015  . Malignant neoplasm of lateral wall of bladder ([place]) 06/23/2014  . Bladder cancer ([place]) 09/22/2013  . Prostate cancer ([place]) 09/22/2013  . History of prostate cancer 04/04/2013  . Diabetes mellitus, type 2 ([place]) 02/25/2013  . Coronary artery disease 02/23/2013  . Essential hypertension 02/23/2013  . Hyperlipidemia 02/23/2013  . Hypertension 02/23/2013  . Murmur 02/23/2013  . CAD in native artery 02/23/2013  . Malignant neoplasm of urinary bladder ([place]) 02/18/2013  . Gross hematuria 02/14/2013  . ED (erectile dysfunction) of organic origin 09/01/2011  . Rash of groin 09/01/2011    [place] 11/09/2019, 2:36 PM  [facility] [address] [place], [place], [address] Phone: [phone]   Fax:  [phone]  Name: WADE BATDORF MRN: [id number] Date of Birth: 09/01/1931

## 2019-11-10 NOTE — Progress Notes (Signed)
[facility] [place], [place] - [address] AT [address] [place] [address] Phone: [phone] Fax: [phone]      Your procedure is scheduled on Tuesday November 15, 2019.  Report to [facility] Main Entrance "A" at 08:45 A.M., and check in at the Admitting office.  Call this number if you have problems the morning of surgery:  [phone]  Call [phone] if you have any questions prior to your surgery date Monday-Friday 8am-4pm    Remember:  Do not eat or drink after midnight the night before your surgery    Take these medicines the morning of surgery with A SIP OF WATER:     Acetaminophen (Tylenol) 650mg  acetaminophen (TYLENOL) 500 mg - as needed amiodarone (PACERONE) ezetimibe (ZETIA)  nitroGLYCERIN (NITROSTAT) - as needed  Follow your surgeon's instructions on when to stop apixaban (ELIQUIS).  If no instructions were given by your surgeon then you will need to call the office to get those instructions.    7 days prior to surgery STOP taking any Aspirin, Aleve, Naproxen, Ibuprofen, Motrin, Advil, Goody's, BC's, all herbal medications, fish oil, and all vitamins.   WHAT DO I DO ABOUT MY DIABETES MEDICATION?   Marland Kitchen Do not take empagliflozin (JARDIANCE) the day before surgery and the day of surgery  . THE NIGHT BEFORE SURGERY, take 10 units of Insulin Glargine (BASAGLAR KWIKPEN [place])      . THE MORNING OF SURGERY, take 10 units of Insulin Glargine (BASAGLAR KWIKPEN [place])    HOW TO MANAGE YOUR DIABETES BEFORE AND AFTER SURGERY  Why is it important to control my blood sugar before and after surgery? . Improving blood sugar levels before and after surgery helps healing and can limit problems. . A way of improving blood sugar control is eating a healthy diet by: o  Eating less sugar and carbohydrates o  Increasing activity/exercise o  Talking with your doctor about reaching your blood sugar goals . High blood sugars  (greater than 180 mg/dL) can raise your risk of infections and slow your recovery, so you will need to focus on controlling your diabetes during the weeks before surgery. . Make sure that the doctor who takes care of your diabetes knows about your planned surgery including the date and location.  How do I manage my blood sugar before surgery? . Check your blood sugar at least 4 times a day, starting 2 days before surgery, to make sure that the level is not too high or low. . Check your blood sugar the morning of your surgery when you wake up and every 2 hours until you get to the Short Stay unit. o If your blood sugar is less than 70 mg/dL, you will need to treat for low blood sugar: - Do not take insulin. - Treat a low blood sugar (less than 70 mg/dL) with  cup of clear juice (cranberry or apple), 4 glucose tablets, OR glucose gel. - Recheck blood sugar in 15 minutes after treatment (to make sure it is greater than 70 mg/dL). If your blood sugar is not greater than 70 mg/dL on recheck, call [phone] for further instructions. . Report your blood sugar to the short stay nurse when you get to Short Stay.  . If you are admitted to the hospital after surgery: o Your blood sugar will be checked by the staff and you will probably be given insulin after surgery (instead of oral diabetes medicines) to  make sure you have good blood sugar levels. o The goal for blood sugar control after surgery is 80-180 mg/dL.    The Morning of Surgery  Do not wear jewelry, make-up or nail polish.  Do not wear lotions, powders, or perfumes/colognes, or deodorant  Men may shave face and neck.  Do not bring valuables to the hospital.  [facility] is not responsible for any belongings or valuables.  If you are a smoker, DO NOT Smoke 24 hours prior to surgery  If you wear a CPAP at night please bring your mask the morning of surgery   Remember that you must have someone to transport you home after your surgery,  and remain with you for 24 hours if you are discharged the same day.   Please bring cases for contacts, glasses, hearing aids, dentures or bridgework because it cannot be worn into surgery.    Leave your suitcase in the car.  After surgery it may be brought to your room.  For patients admitted to the hospital, discharge time will be determined by your treatment team.  Patients discharged the day of surgery will not be allowed to drive home.    Special instructions:   [facility]- Preparing For Surgery  Before surgery, you can play an important role. Because skin is not sterile, your skin needs to be as free of germs as possible. You can reduce the number of germs on your skin by washing with CHG (chlorahexidine gluconate) Soap before surgery.  CHG is an antiseptic cleaner which kills germs and bonds with the skin to continue killing germs even after washing.    Oral Hygiene is also important to reduce your risk of infection.  Remember - BRUSH YOUR TEETH THE MORNING OF SURGERY WITH YOUR REGULAR TOOTHPASTE  Please do not use if you have an allergy to CHG or antibacterial soaps. If your skin becomes reddened/irritated stop using the CHG.  Do not shave (including legs and underarms) for at least 48 hours prior to first CHG shower. It is OK to shave your face.  Please follow these instructions carefully.   1. Shower the NIGHT BEFORE SURGERY and the MORNING OF SURGERY with CHG Soap.   2. If you chose to wash your hair, wash your hair first as usual with your normal shampoo.  3. After you shampoo, rinse your hair and body thoroughly to remove the shampoo.  4. Use CHG as you would any other liquid soap. You can apply CHG directly to the skin and wash gently with a scrungie or a clean washcloth.   5. Apply the CHG Soap to your body ONLY FROM THE NECK DOWN.  Do not use on open wounds or open sores. Avoid contact with your eyes, ears, mouth and genitals (private parts). Wash Face and genitals  (private parts)  with your normal soap.   6. Wash thoroughly, paying special attention to the area where your surgery will be performed.  7. Thoroughly rinse your body with warm water from the neck down.  8. DO NOT shower/wash with your normal soap after using and rinsing off the CHG Soap.  9. Pat yourself dry with a CLEAN TOWEL.  10. Wear CLEAN PAJAMAS to bed the night before surgery, wear comfortable clothes the morning of surgery  11. Place CLEAN SHEETS on your bed the night of your first shower and DO NOT SLEEP WITH PETS.    Day of Surgery:  Please shower the morning of surgery with the CHG soap  Do not apply any deodorants/lotions. Please wear clean clothes to the hospital/surgery center.   Remember to brush your teeth WITH YOUR REGULAR TOOTHPASTE.   Please read over the following fact sheets that you were given.

## 2019-11-11 ENCOUNTER — Encounter (HOSPITAL_COMMUNITY)
Admission: RE | Admit: 2019-11-11 | Discharge: 2019-11-11 | Disposition: A | Discharge status: Home / Self Care | Payer: Medicare Other | Source: Ambulatory Visit | Attending: Cardiovascular Disease | Admitting: Cardiovascular Disease

## 2019-11-11 ENCOUNTER — Encounter (HOSPITAL_COMMUNITY): Payer: Self-pay

## 2019-11-11 ENCOUNTER — Other Ambulatory Visit: Payer: Self-pay

## 2019-11-11 ENCOUNTER — Other Ambulatory Visit (HOSPITAL_COMMUNITY)
Admission: RE | Admit: 2019-11-11 | Discharge: 2019-11-11 | Disposition: A | Discharge status: Home / Self Care | Payer: Medicare Other | Source: Ambulatory Visit | Attending: Cardiovascular Disease | Admitting: Cardiovascular Disease

## 2019-11-11 DIAGNOSIS — I350 Nonrheumatic aortic (valve) stenosis: Secondary | ICD-10-CM | POA: Diagnosis not present

## 2019-11-11 DIAGNOSIS — Z01812 Encounter for preprocedural laboratory examination: Secondary | ICD-10-CM | POA: Insufficient documentation

## 2019-11-11 DIAGNOSIS — Z20822 Contact with and (suspected) exposure to covid-19: Secondary | ICD-10-CM | POA: Diagnosis not present

## 2019-11-11 DIAGNOSIS — Z01810 Encounter for preprocedural cardiovascular examination: Secondary | ICD-10-CM | POA: Insufficient documentation

## 2019-11-11 DIAGNOSIS — Z952 Presence of prosthetic heart valve: Secondary | ICD-10-CM | POA: Diagnosis not present

## 2019-11-11 DIAGNOSIS — J9811 Atelectasis: Secondary | ICD-10-CM | POA: Diagnosis not present

## 2019-11-11 DIAGNOSIS — Z01818 Encounter for other preprocedural examination: Secondary | ICD-10-CM | POA: Diagnosis not present

## 2019-11-11 HISTORY — DX: Personal history of urinary calculi: Z87.442

## 2019-11-11 HISTORY — DX: Type 2 diabetes mellitus without complications: E11.9

## 2019-11-11 LAB — CBC
HCT: 50.5 % (ref 39.0–52.0)
Hemoglobin: 16.5 g/dL (ref 13.0–17.0)
MCH: 31.1 pg (ref 26.0–34.0)
MCHC: 32.7 g/dL (ref 30.0–36.0)
MCV: 95.1 fL (ref 80.0–100.0)
Platelets: 159 10*3/uL (ref 150–400)
RBC: 5.31 MIL/uL (ref 4.22–5.81)
RDW: 13.2 % (ref 11.5–15.5)
WBC: 7.4 10*3/uL (ref 4.0–10.5)
nRBC: 0 % (ref 0.0–0.2)

## 2019-11-11 LAB — BLOOD GAS, ARTERIAL
Acid-Base Excess: 1 mmol/L (ref 0.0–2.0)
Bicarbonate: 25.1 mmol/L (ref 20.0–28.0)
FIO2: 21
O2 Saturation: 96.7 %
Patient temperature: 37
pCO2 arterial: 40.4 mmHg (ref 32.0–48.0)
pH, Arterial: 7.41 (ref 7.350–7.450)
pO2, Arterial: 93.7 mmHg (ref 83.0–108.0)

## 2019-11-11 LAB — COMPREHENSIVE METABOLIC PANEL
ALT: 24 U/L (ref 0–44)
AST: 25 U/L (ref 15–41)
Albumin: 3.8 g/dL (ref 3.5–5.0)
Alkaline Phosphatase: 87 U/L (ref 38–126)
Anion gap: 13 (ref 5–15)
BUN: 20 mg/dL (ref 8–23)
CO2: 21 mmol/L — ABNORMAL LOW (ref 22–32)
Calcium: 8.8 mg/dL — ABNORMAL LOW (ref 8.9–10.3)
Chloride: 105 mmol/L (ref 98–111)
Creatinine, Ser: 1.61 mg/dL — ABNORMAL HIGH (ref 0.61–1.24)
GFR calc Af Amer: 44 mL/min — ABNORMAL LOW (ref >60)
GFR calc non Af Amer: 38 mL/min — ABNORMAL LOW (ref >60)
Glucose, Bld: 133 mg/dL — ABNORMAL HIGH (ref 70–99)
Potassium: 3.9 mmol/L (ref 3.5–5.1)
Sodium: 139 mmol/L (ref 135–145)
Total Bilirubin: 1 mg/dL (ref 0.3–1.2)
Total Protein: 6.8 g/dL (ref 6.5–8.1)

## 2019-11-11 LAB — URINALYSIS, ROUTINE W REFLEX MICROSCOPIC
Bacteria, UA: NONE SEEN
Bilirubin Urine: NEGATIVE
Glucose, UA: >=500 mg/dL — AB
Hgb urine dipstick: NEGATIVE
Ketones, ur: NEGATIVE mg/dL
Leukocytes,Ua: NEGATIVE
Nitrite: NEGATIVE
Protein, ur: NEGATIVE mg/dL
Specific Gravity, Urine: 1.032 — ABNORMAL HIGH (ref 1.005–1.030)
pH: 5 (ref 5.0–8.0)

## 2019-11-11 LAB — TYPE AND SCREEN
ABO/RH(D): A NEG
Antibody Screen: NEGATIVE

## 2019-11-11 LAB — SARS CORONAVIRUS 2 (TAT 6-24 HRS): SARS Coronavirus 2: NEGATIVE

## 2019-11-11 LAB — BRAIN NATRIURETIC PEPTIDE: B Natriuretic Peptide: 91 pg/mL (ref 0.0–100.0)

## 2019-11-11 LAB — GLUCOSE, CAPILLARY: Glucose-Capillary: 156 mg/dL — ABNORMAL HIGH (ref 70–99)

## 2019-11-11 LAB — ABO/RH: ABO/RH(D): A NEG

## 2019-11-11 LAB — SURGICAL PCR SCREEN
MRSA, PCR: NEGATIVE
Staphylococcus aureus: NEGATIVE

## 2019-11-11 NOTE — Progress Notes (Signed)
PCP:  Dr. Marco Collie Cardiologist:  Dr. Shirlee More  EKG:  11/11/19 CXR:  11/11/19 ECHO:  06/22/19 Stress Test:  denies Cardiac Cath:  10/19/19  Fasting Blood Sugar-  90-115 Checks Blood Sugar__1_ times a day  Anesthesia Review:  Yes, Elilquis last dose 11/10/19  Patient denies shortness of breath, fever, cough, and chest pain at PAT appointment.  Patient verbalized understanding of instructions provided today at the PAT appointment.  Patient asked to review instructions at home and day of surgery.

## 2019-11-12 LAB — HEMOGLOBIN A1C
Hgb A1c MFr Bld: 6.9 % — ABNORMAL HIGH (ref 4.8–5.6)
Mean Plasma Glucose: 151.33 mg/dL

## 2019-11-13 DIAGNOSIS — I129 Hypertensive chronic kidney disease with stage 1 through stage 4 chronic kidney disease, or unspecified chronic kidney disease: Secondary | ICD-10-CM | POA: Diagnosis not present

## 2019-11-13 DIAGNOSIS — N182 Chronic kidney disease, stage 2 (mild): Secondary | ICD-10-CM | POA: Diagnosis not present

## 2019-11-13 DIAGNOSIS — E782 Mixed hyperlipidemia: Secondary | ICD-10-CM | POA: Diagnosis not present

## 2019-11-13 DIAGNOSIS — E1169 Type 2 diabetes mellitus with other specified complication: Secondary | ICD-10-CM | POA: Diagnosis not present

## 2019-11-14 ENCOUNTER — Other Ambulatory Visit: Payer: Self-pay

## 2019-11-14 DIAGNOSIS — I350 Nonrheumatic aortic (valve) stenosis: Secondary | ICD-10-CM

## 2019-11-14 MED ORDER — SODIUM CHLORIDE 0.9 % IV SOLN
1.5000 g | INTRAVENOUS | Status: AC
  Administered 2019-11-15: 1.5 g via INTRAVENOUS
  Filled 2019-11-14: qty 1.5

## 2019-11-14 MED ORDER — VANCOMYCIN HCL 1250 MG/250ML IV SOLN
1250.0000 mg | INTRAVENOUS | Status: AC
  Administered 2019-11-15: 1250 mg via INTRAVENOUS
  Filled 2019-11-14: qty 250

## 2019-11-14 MED ORDER — NOREPINEPHRINE 4 MG/250ML-% IV SOLN
0.0000 ug/min | INTRAVENOUS | Status: AC
  Administered 2019-11-15: 2 ug/min via INTRAVENOUS
  Filled 2019-11-14 (×2): qty 250

## 2019-11-14 MED ORDER — POTASSIUM CHLORIDE 2 MEQ/ML IV SOLN
80.0000 meq | INTRAVENOUS | Status: DC
  Filled 2019-11-14: qty 40

## 2019-11-14 MED ORDER — SODIUM CHLORIDE 0.9 % IV SOLN
INTRAVENOUS | Status: DC
  Filled 2019-11-14: qty 30

## 2019-11-14 MED ORDER — MAGNESIUM SULFATE 50 % IJ SOLN
40.0000 meq | INTRAMUSCULAR | Status: DC
  Filled 2019-11-14: qty 9.85

## 2019-11-14 MED ORDER — DEXMEDETOMIDINE HCL IN NACL 400 MCG/100ML IV SOLN
0.1000 ug/kg/h | INTRAVENOUS | Status: DC
  Filled 2019-11-14: qty 100

## 2019-11-14 NOTE — H&P (Signed)
[address]       [place] [address]             [phone]      Cardiothoracic Surgery Admission History and Physical   Referring Provider is Bettina Gavia, Hilton Cork, MD  Primary Cardiologist is Shirlee More, MD  PCP is Marco Collie, MD      Chief Complaint  Patient presents with  . Aortic Stenosis       HPI:  The patient is an 86 year old gentleman with a history of hypertension, paroxysmal atrial fibrillation on Eliquis, and chronic combined systolic and diastolic congestive heart failure as well as aortic stenosis. He was hospitalized in 2018 with atrial fibrillation with rapid ventricular response and acute heart failure. He was noted to have an ejection fraction of 25 to 30% at that time with diffuse hypokinesis. There was mild aortic stenosis with a mean gradient of 16 mmHg as well as moderate aortic insufficiency. There was mild to moderate mitral regurgitation. He underwent cardiac catheterization at that time which showed 40% left main stenosis, 60% LAD stenosis, insignificant disease in the left circumflex, and an 85% distal RCA stenosis. He was noted to have elevated filling pressures with prominent V waves in his wedge tracing suggestive of mitral regurgitation. His atrial fibrillation was converted with amiodarone and he has remained on amiodarone since. Follow-up echocardiogram in 2019 showed his ejection fraction increased to 55%. 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 His daughter is with him today and reports that over the past few months he has been tired and feels weak. He has exertional shortness of breath with low-level activity such as going up 1 flight of stairs. He has had intermittent dizziness and had a syncopal spell a few weeks ago while getting up out of bed at night. His most recent echocardiogram and October 2020 showed an increase in the aortic valve mean gradient to 30  mmHg with a peak gradient of 51 mmHg. Aortic valve area was measured at 0.67 by VTI. There was mild aortic insufficiency. Left ventricular ejection fraction was 60 to 65% with moderate LVH and diastolic dysfunction. There was mild mitral regurgitation. LV stroke-volume was measured at 56 cc with an index of 29. He underwent repeat catheterization on 10/19/2019 which showed heavily calcified coronary arteries which did not look much different compared to his prior catheterization in 2018. The mean gradient across aortic valve was measured at 29.4 mmHg with a valve area of 0.98 cm. Cardiac index was 2.27. Right heart pressures were low.  The patient is married and lives with his wife. He has a daughter who came to the office with him and a son who lives in [place].      Past Medical History:  Diagnosis Date  . Cancer ([facility])    bladdre cancer  . Chronic combined systolic (congestive) and diastolic (congestive) heart failure ([facility])   . Coronary artery disease   . History of bladder cancer   . History of TIA (transient ischemic attack)   . Hypertension   . PAF (paroxysmal atrial fibrillation) ([facility])    on Eliquis  . Severe aortic stenosis         Past Surgical History:  Procedure Laterality Date  . CARDIAC CATHETERIZATION    . HERNIA REPAIR    . JOINT REPLACEMENT    . PROSTATE SURGERY    . RIGHT/LEFT  HEART CATH AND CORONARY ANGIOGRAPHY N/A 09/03/2017   Procedure: RIGHT/LEFT HEART CATH AND CORONARY ANGIOGRAPHY; Surgeon: Jolaine Artist, MD; Location: [facility]; Service: Cardiovascular; Laterality: N/A;  . RIGHT/LEFT HEART CATH AND CORONARY ANGIOGRAPHY N/A 10/19/2019   Procedure: RIGHT/LEFT HEART CATH AND CORONARY ANGIOGRAPHY; Surgeon: Wellington Hampshire, MD; Location: [facility]; Service: Cardiovascular; Laterality: N/A;        Family History  Problem Relation Age of Onset  . Heart attack Father   . Heart Problems Brother    Social History        Socioeconomic History    . Marital status: Married    Spouse name: Not on file  . Number of children: Not on file  . Years of education: Not on file  . Highest education level: Not on file  Occupational History  . Not on file  Tobacco Use  . Smoking status: Never Smoker  . Smokeless tobacco: Never Used  Substance and Sexual Activity  . Alcohol use: No  . Drug use: No  . Sexual activity: Not on file  Other Topics Concern  . Not on file  Social History Narrative  . Not on file   Social Determinants of Health      Financial Resource Strain: Low Risk   . Difficulty of Paying Living Expenses: Not hard at all  Food Insecurity: No Food Insecurity  . Worried About Charity fundraiser in the Last Year: Never true  . Ran Out of Food in the Last Year: Never true  Transportation Needs: No Transportation Needs  . Lack of Transportation (Medical): No  . Lack of Transportation (Non-Medical): No  Physical Activity: Inactive  . Days of Exercise per Week: 0 days  . Minutes of Exercise per Session: 0 min  Stress: No Stress Concern Present  . Feeling of Stress : Not at all  Social Connections: Slightly Isolated  . Frequency of Communication with Friends and Family: More than three times a week  . Frequency of Social Gatherings with Friends and Family: Never  . Attends Religious Services: More than 4 times per year  . Active Member of Clubs or Organizations: No  . Attends Archivist Meetings: Never  . Marital Status: Married  Human resources officer Violence: Not At Risk  . Fear of Current or Ex-Partner: No  . Emotionally Abused: No  . Physically Abused: No  . Sexually Abused: No         Current Outpatient Medications  Medication Sig Dispense Refill  . acetaminophen (TYLENOL 8 HOUR ARTHRITIS PAIN) 650 MG CR tablet Take 650 mg by mouth at bedtime.     Marland Kitchen acetaminophen (TYLENOL) 500 MG tablet Take 500 mg by mouth every 6 (six) hours as needed for mild pain (out of arthritis).    Marland Kitchen amiodarone (PACERONE) 200  MG tablet Take 1 tablet (200 mg total) by mouth daily. 90 tablet 1  . apixaban (ELIQUIS) 2.5 MG TABS tablet Take 1 tablet (2.5 mg total) by mouth 2 (two) times daily.    . empagliflozin (JARDIANCE) 25 MG TABS tablet Take 25 mg by mouth daily.    Marland Kitchen ezetimibe (ZETIA) 10 MG tablet Take 1 tablet (10 mg total) by mouth daily. 30 tablet 3  . furosemide (LASIX) 80 MG tablet TAKE 1 TABLET ONCE DAILY. (Patient taking differently: Take 80 mg by mouth daily. ) 90 tablet 1  . Insulin Glargine (BASAGLAR KWIKPEN [place]) Inject 20 Units into the skin at bedtime.     Marland Kitchen  losartan (COZAAR) 25 MG tablet TAKE 0.5 TABLET BY MOUTH DAILY (Patient taking differently: Take 12.5 mg by mouth daily. ) 30 tablet 3  . lovastatin (MEVACOR) 40 MG tablet Take 40 mg by mouth at bedtime.     . nitroGLYCERIN (NITROSTAT) 0.4 MG SL tablet DISSOLVE 1 TABLET UNDER THE TONGUE EVERY 5 MINUTES AS NEEDED FOR CHEST PAIN. (Patient taking differently: Place 0.4 mg under the tongue every 5 (five) minutes as needed for chest pain. ) 25 tablet 3  . triamcinolone cream (KENALOG) 0.1 % Apply 1 application topically 2 (two) times daily as needed (itching).      No current facility-administered medications for this visit.       Allergies  Allergen Reactions  . Spironolactone Rash   Review of Systems:   General: normal appetite, + decreased energy, no weight gain, no weight loss, no fever  Cardiac: no chest pain with exertion, no chest pain at rest, +SOB with low level exertion, no resting SOB, no PND, no orthopnea, no palpitations, + arrhythmia, + paroxysmal atrial fibrillation, no LE edema, + dizzy spells, + syncope  Respiratory: + exertional shortness of breath, no home oxygen, no productive cough, no dry cough, no bronchitis, no wheezing, no hemoptysis, no asthma, no pain with inspiration or cough, no sleep apnea, no CPAP at night  GI: no difficulty swallowing, no reflux, no frequent heartburn, no hiatal hernia, no abdominal pain, no constipation,  no diarrhea, no hematochezia, no hematemesis, no melena  GU: no dysuria, no frequency, no urinary tract infection, no hematuria, no enlarged prostate, no kidney stones, no kidney disease  Vascular: no pain suggestive of claudication, no pain in feet, no leg cramps, no varicose veins, no DVT, no non-healing foot ulcer  Neuro: no stroke, no TIA's, no seizures, no headaches, no temporary blindness one eye, no slurred speech, no peripheral neuropathy, no chronic pain, + instability of gait, no memory/cognitive dysfunction  Musculoskeletal: + arthritis, no joint swelling, no myalgias, + difficulty walking with some balance problems, + reduced mobility  Skin: no rash, + itching, no skin infections, no pressure sores or ulcerations  Psych: no anxiety, no depression, no nervousness, no unusual recent stress  Eyes: no blurry vision, no floaters, no recent vision changes, + wears glasses or contacts  ENT: + hearing loss, no loose or painful teeth, + dentures  Hematologic: + easy bruising, no abnormal bleeding, no clotting disorder, no frequent epistaxis  Endocrine: + diabetes, does check CBG's at home    Physical Exam:   BP 127/77 (BP Location: Right Arm, Patient Position: Sitting, Cuff Size: Normal)  Pulse 67  Temp 97.7 F (36.5 C)  Resp 16  Ht 5\' 8"  (1.727 m)  Wt 170 lb (77.1 kg)  SpO2 95% Comment: RA  BMI 25.85 kg/m  General: Elderly but well-appearing  HEENT: Unremarkable, NCAT, PERLA, EOMI  Neck: no JVD, no bruits, no adenopathy  Chest: clear to auscultation, symmetrical breath sounds, no wheezes, no rhonchi  CV: RRR, grade lll/VI crescendo/decrescendo murmur heard best at RSB, no diastolic murmur  Abdomen: soft, non-tender, no masses or organomegaly  Extremities: warm, well-perfused, pulses palpable at ankles, no LE edema  Rectal/GU Deferred  Neuro: Grossly non-focal and symmetrical throughout  Skin: Clean and dry, no rashes, no breakdown    Diagnostic Tests:   ECHOCARDIOGRAM  REPORT     Patient Name: THADEUS LUNDHOLM Date of Exam: 06/22/2019  Medical Rec #: [id number] Height: 68.0 in  Accession #: [id number] Weight: 173.0 lb  Date  of Birth: 04-29-1931 BSA: 1.92 m  Patient Age: 83 years BP: 152/70 mmHg  Patient Gender: M HR: 69 bpm.  Exam Location: [facility]   Procedure: 2D Echo   Indications: R07.9* Chest pain, unspecified   History: Patient has prior history of Echocardiogram examinations,  most  recent 10/18/2018. Aortic Valve Disease  Signs/Symptoms:Paroxysmal  A-fib Risk Factors:Hypertension.   Sonographer: Luane School  Referring Phys: [phone] Joseph    1. Left ventricular ejection fraction, by visual estimation, is 60 to  65%. The left ventricle has normal function. Normal left ventricular size.  There is moderately increased left ventricular hypertrophy.  2. Left ventricular diastolic Doppler parameters are consistent with  impaired relaxation pattern of LV diastolic filling.  3. Global right ventricle has normal systolic function.The right  ventricular size is normal. No increase in right ventricular wall  thickness.  4. Left atrial size was mild-moderately dilated.  5. Right atrial size was normal.  6. The mitral valve is normal in structure. Mild mitral valve  regurgitation. No evidence of mitral stenosis.  7. The tricuspid valve is normal in structure. Tricuspid valve  regurgitation was not visualized by color flow Doppler.  8. The aortic valve is normal in structure. Aortic valve regurgitation is  mild by color flow Doppler. Moderate aortic valve stenosis.  9. The pulmonic valve was normal in structure. Pulmonic valve  regurgitation is not visualized by color flow Doppler.  10. Normal pulmonary artery systolic pressure.  11. The inferior vena cava is normal in size with greater than 50%  respiratory variability, suggesting right atrial pressure of 3 mmHg.   FINDINGS  Left Ventricle: Left ventricular ejection  fraction, by visual estimation,  is 60 to 65%. The left ventricle has normal function. There is moderately  increased left ventricular hypertrophy. Normal left ventricular size.  Spectral Doppler shows Left  ventricular diastolic Doppler parameters are consistent with impaired  relaxation pattern of LV diastolic filling.   Right Ventricle: The right ventricular size is normal. No increase in  right ventricular wall thickness. Global RV systolic function is has  normal systolic function. The tricuspid regurgitant velocity is 2.51 m/s,  and with an assumed right atrial pressure  of 3 mmHg, the estimated right ventricular systolic pressure is normal at  28.1 mmHg.   Left Atrium: Left atrial size was mild-moderately dilated.   Right Atrium: Right atrial size was normal in size   Pericardium: There is no evidence of pericardial effusion.   Mitral Valve: The mitral valve is normal in structure. There is mild  thickening of the mitral valve leaflet(s). No evidence of mitral valve  stenosis by observation. Mild mitral valve regurgitation.   Tricuspid Valve: The tricuspid valve is normal in structure. Tricuspid  valve regurgitation was not visualized by color flow Doppler.   Aortic Valve: The aortic valve is normal in structure. Aortic valve  regurgitation is mild by color flow Doppler. Aortic regurgitation PHT  measures 572 msec. Moderate aortic stenosis is present. Aortic valve mean  gradient measures 30.0 mmHg. Aortic valve  peak gradient measures 51.0 mmHg. Aortic valve area, by VTI measures 0.67  cm.   Pulmonic Valve: The pulmonic valve was normal in structure. Pulmonic valve  regurgitation is not visualized by color flow Doppler.   Aorta: The aortic root, ascending aorta and aortic arch are all  structurally normal, with no evidence of dilitation or obstruction.   Venous: The inferior vena cava is normal in size with greater than  50%  respiratory variability, suggesting right  atrial pressure of 3 mmHg.   IAS/Shunts: No atrial level shunt detected by color flow Doppler. No  ventricular septal defect is seen or detected. There is no evidence of an  atrial septal defect.     LEFT VENTRICLE  PLAX 2D  LVIDd: 5.00 cm Diastology  LVIDs: 3.80 cm LV e' lateral: 2.72 cm/s  LV PW: 1.70 cm LV E/e' lateral: 18.4  LV IVS: 1.80 cm LV e' medial: 3.15 cm/s  LVOT diam: 2.00 cm LV E/e' medial: 15.9  LV SV: 56 ml  LV SV Index: 28.81  LVOT Area: 3.14 cm    RIGHT VENTRICLE IVC  TAPSE (M-mode): 2.2 cm IVC diam: 1.30 cm   LEFT ATRIUM Index RIGHT ATRIUM Index  LA diam: 4.50 cm 2.34 cm/m RA Area: 16.30 cm  LA Vol (A2C): 73.5 ml 38.25 ml/m RA Volume: 33.30 ml 17.33 ml/m  LA Vol (A4C): 68.8 ml 35.80 ml/m  LA Biplane Vol: 71.6 ml 37.26 ml/m  AORTIC VALVE  AV Area (Vmax): 0.73 cm  AV Area (Vmean): 0.73 cm  AV Area (VTI): 0.67 cm  AV Vmax: 357.00 cm/s  AV Vmean: 259.000 cm/s  AV VTI: 0.898 m  AV Peak Grad: 51.0 mmHg  AV Mean Grad: 30.0 mmHg  LVOT Vmax: 82.80 cm/s  LVOT Vmean: 59.900 cm/s  LVOT VTI: 0.191 m  LVOT/AV VTI ratio: 0.21  AI PHT: 572 msec   AORTA  Ao Root diam: 3.70 cm  Ao Asc diam: 3.80 cm   MV E velocity: 50.00 cm/s 103 cm/s TRICUSPID VALVE  TR Peak grad: 25.1 mmHg  TR Vmax: 255.00 cm/s   SHUNTS  Systemic VTI: 0.19 m  Systemic Diam: 2.00 cm    Jyl Heinz MD  Electronically signed by Jyl Heinz MD  Signature Date/Time: 06/22/2019/12:08:00 PM     Panel Physicians Referring Physician Case Authorizing Physician  Wellington Hampshire, MD (Primary)       Procedures  RIGHT/LEFT HEART CATH AND CORONARY ANGIOGRAPHY     Conclusion  Mid LM to Dist LM lesion is 40% stenosed.  Prox LAD lesion is 60% stenosed.  Ost 1st Diag to 1st Diag lesion is 30% stenosed.  Prox Cx to Mid Cx lesion is 30% stenosed.  Prox RCA lesion is 40% stenosed.  Mid RCA lesion is 30% stenosed.  Dist RCA lesion is 60% stenosed.  RPAV lesion is 85% stenosed.   The left ventricular systolic function is normal.  LV end diastolic pressure is normal.  The left ventricular ejection fraction is 55-65% by visual estimate.  There is severe aortic valve stenosis. 1. Heavily calcified coronary arteries with moderate left main stenosis, moderate proximal LAD stenosis and significant stenosis in the right posterior AV groove. The coronary arteries do not look different from most recent catheterization in 2018.  2. Normal LV systolic function.  3. Right heart catheterization showed low filling pressures with RA pressure of 1 mmHg and pulmonary capillary wedge pressure of 5, normal pulmonary pressure at 26/3 and normal cardiac output at 4.29 with a cardiac index of 2.27.  4. Severe aortic stenosis with a mean gradient of 29.4 mmHg and valve area of 0.98.  Recommendations:  Continue evaluation for TAVR. I do not think coronary revascularization is needed.  Eliquis can be resumed tomorrow.  I decreased the dose of furosemide to 40 mg once daily.            Indications  Nonrheumatic aortic valve stenosis [I35.0 (ICD-10-CM)]  Procedural Details  Technical Details Procedural Details: The pre-existing IV in the right antecubital vein was exchanged under sterile fashion to a slender sheath. The right wrist was prepped, draped, and anesthetized with 1% lidocaine. Using the modified Seldinger technique, a 5 French sheath was introduced into the right radial artery. 3 mg of verapamil was administered through the sheath, weight-based unfractionated heparin was administered intravenously. Right heart catheterization was performed using a 5 French Swan-Ganz catheter. Cardiac output was calculated by the Fick method. A Jackie catheter was used for selective coronary angiography and left ventriculography. This catheter was used to cross the aortic valve with a straight wire with some difficulty. A JL 3.5 catheter was used to engage the left main coronary artery . catheter  exchanges were performed over an exchange length guidewire. There were no immediate procedural complications. A TR band was used for radial hemostasis at the completion of the procedure. The patient was transferred to the post catheterization recovery area for further monitoring. Estimated blood loss <50 mL.   During this procedure medications were administered to achieve and maintain moderate conscious sedation while the patient's heart rate, blood pressure, and oxygen saturation were continuously monitored and I was present face-to-face 100% of this time.     Medications  (Filter: Administrations occurring from 10/19/19 1043 to 10/19/19 1142)  Continuous medications are totaled by the amount administered until 10/19/19 1142.  Heparin (Porcine) in NaCl 1000-0.9 UT/500ML-% SOLN (mL)  Total volume: 500 mL  Date/Time   Rate/Dose/Volume Action  10/19/19 1050  500 mL Given  Heparin (Porcine) in NaCl 1000-0.9 UT/500ML-% SOLN (mL)  Total volume: 500 mL  Date/Time   Rate/Dose/Volume Action  10/19/19 1050  500 mL Given  fentaNYL (SUBLIMAZE) injection (mcg)  Total dose: 25 mcg  Date/Time   Rate/Dose/Volume Action  10/19/19 1051  25 mcg Given  midazolam (VERSED) injection (mg)  Total dose: 1 mg  Date/Time   Rate/Dose/Volume Action  10/19/19 1052  1 mg Given  lidocaine (PF) (XYLOCAINE) 1 % injection (mL)  Total volume: 3 mL  Date/Time   Rate/Dose/Volume Action  10/19/19 1103  1 mL Given  1109  2 mL Given  Radial Cocktail/Verapamil only (mL)  Total volume: 10 mL  Date/Time   Rate/Dose/Volume Action  10/19/19 1111  10 mL Given  heparin injection (Units)  Total dose: 4,000 Units  Date/Time   Rate/Dose/Volume Action  10/19/19 1116  4,000 Units Given  iohexol (OMNIPAQUE) 350 MG/ML injection (mL)  Total volume: 75 mL  Date/Time   Rate/Dose/Volume Action  10/19/19 1133  75 mL Given     Sedation Time  Sedation Time Physician-1: 41 minutes 21 seconds        Contrast  Medication  Name Total Dose  iohexol (OMNIPAQUE) 350 MG/ML injection 75 mL     Radiation/Fluoro  Fluoro time: 11.1 (min)  DAP: [id number] (mGycm2)  Cumulative Air Kerma: [id number] (mGy)        Coronary Findings  Diagnostic  Dominance: Right  Left Main  Mid LM to Dist LM lesion 40% stenosed  Mid LM to Dist LM lesion is 40% stenosed.   Left Anterior Descending  Prox LAD lesion 60% stenosed  Prox LAD lesion is 60% stenosed.   First Diagonal Branch  Ost 1st Diag to 1st Diag lesion 30% stenosed  Ost 1st Diag to 1st Diag lesion is 30% stenosed.   Left Circumflex  Prox Cx to Mid Cx lesion 30% stenosed  Prox Cx to Mid Cx lesion is 30% stenosed.  Right Coronary Artery  Prox RCA lesion 40% stenosed  Prox RCA lesion is 40% stenosed.  Mid RCA lesion 30% stenosed  Mid RCA lesion is 30% stenosed. The lesion was previously treated.  Dist RCA lesion 60% stenosed  Dist RCA lesion is 60% stenosed.   Right Posterior Atrioventricular Artery  RPAV lesion 85% stenosed  RPAV lesion is 85% stenosed.  Intervention  No interventions have been documented.                      Wall Motion         Resting               All segments of the heart are normal.                      Left Heart  Left Ventricle The left ventricular size is normal. The left ventricular systolic function is normal. LV end diastolic pressure is normal. The left ventricular ejection fraction is 55-65% by visual estimate. No regional wall motion abnormalities.   Aortic Valve There is severe aortic valve stenosis. The aortic valve is calcified. There is restricted aortic valve motion.     Coronary Diagrams  Diagnostic  Dominance: Right  &&&&&  Intervention       Implants  No implant documentation for this case.      Syngo Images Link to Procedure Log  Show images for CARDIAC CATHETERIZATION Procedure Log     Images on Long Term Storage   Show images for Prinston, Pianka            Hemo Data    Most  Recent Value  Fick Cardiac Output 4.29 L/min  Fick Cardiac Output Index 2.27 (L/min)/BSA  Aortic Mean Gradient 29.4 mmHg  Aortic Peak Gradient 27 mmHg  Aortic Valve Area 0.98  Aortic Value Area Index 0.52 cm2/BSA  RA A Wave 3 mmHg  RA V Wave 0 mmHg  RA Mean 0 mmHg  RV Systolic Pressure 23 mmHg  RV Diastolic Pressure -2 mmHg  RV EDP 2 mmHg  PA Systolic Pressure 26 mmHg  PA Diastolic Pressure 3 mmHg  PA Mean 12 mmHg  PW A Wave 8 mmHg  PW V Wave 6 mmHg  PW Mean 5 mmHg  AO Systolic Pressure [id number] mmHg  AO Diastolic Pressure 50 mmHg  AO Mean 79 mmHg  LV Systolic Pressure [id number] mmHg  LV Diastolic Pressure 1 mmHg  LV EDP 3 mmHg  AOp Systolic Pressure [id number] mmHg  AOp Diastolic Pressure 54 mmHg  AOp Mean Pressure 86 mmHg  LVp Systolic Pressure 123XX123 mmHg  LVp Diastolic Pressure 2 mmHg  LVp EDP Pressure 5 mmHg  QP/QS 1  TPVR Index 5.27 HRUI  TSVR Index 33.84 HRUI  TPVR/TSVR Ratio 0.16     ADDENDUM REPORT: 11/03/2019 21:44  CLINICAL DATA: 86 year old male with severe aortic stenosis being  evaluated for a TAVR procedure.  EXAM:  Cardiac TAVR CT  TECHNIQUE:  The patient was scanned on a [employer]. A 120 kV  retrospective scan was triggered in the descending thoracic aorta at  111 HU's. Gantry rotation speed was 250 msecs and collimation was .6  mm. No beta blockade or nitro were given. The 3D data set was  reconstructed in 5% intervals of the R-R cycle. Systolic and  diastolic phases were analyzed on a dedicated work station using  MPR, MIP and VRT modes. The patient received 80 cc of  contrast.  FINDINGS:  Aortic Valve: Trileaflet aortic valve with severely calcified  leaflets and asymmetric calcifications extending into the LVOT under  the non-coronary leaflet.  Aorta: Upper normal size of the ascending aorta with maximum  diameter 40 mm, mild to moderate diffuse atherosclerotic plaque and  calcifications. No dissection.  Sinotubular Junction: 36 x 34 mm    Ascending Thoracic Aorta: 40 x 40 mm  Aortic Arch: 27 x 27 mm  Descending Thoracic Aorta: 27 x 25 mm  Sinus of Valsalva Measurements:  Non-coronary: 35 mm  Right -coronary: 33 mm  Left -coronary: 34 mm  Coronary Artery Height above Annulus:  Left Main: 19 mm  Right Coronary: 22 mm  Virtual Basal Annulus Measurements:  Maximum/Minimum Diameter: 28.1 x 22.8 mm  Mean Diameter: 25.6 mm  Perimeter: 82.2 mm  Area: 513 mm2  Optimum Fluoroscopic Angle for Delivery: LAO 17 CAU 13  IMPRESSION:  1. Trileaflet aortic valve with severely calcified leaflets and  asymmetric calcifications extending into the LVOT under the  non-coronary leaflet. Aortic valve calcium score is 3336 consistent  with severe aortic stenosis. Annular measurements suitable for  delivery of a 26 mm Edwards-SAPIEN 3 valve.  2. Sufficient coronary to annulus distance.  3. Optimum Fluoroscopic Angle for Delivery: LAO 17 CAU 13.  4. No thrombus in the left atrial appendage.  Electronically Signed  By: Ena Dawley  On: 11/03/2019 21:44   Addended by Dorothy Spark, MD on 11/03/2019 9:47 PM  Study Result   EXAM:  OVER-READ INTERPRETATION CT CHEST  The following report is an over-read performed by radiologist Dr.  Vinnie Langton of [facility], [place] on 11/02/2019. This  over-read does not include interpretation of cardiac or coronary  anatomy or pathology. The coronary calcium score/coronary CTA  interpretation by the cardiologist is attached.  COMPARISON: Chest CTA 08/30/2017.  FINDINGS:  Extracardiac findings will be described separately under dictation  for contemporaneously obtained CTA chest, abdomen and pelvis.  IMPRESSION:  Please see separate dictation for contemporaneously obtained CTA  chest, abdomen and pelvis dated 11/02/2019 for full description of  relevant extracardiac findings.  Electronically Signed:  By: Vinnie Langton M.D.  On: 11/02/2019 10:32   CLINICAL DATA: 86 year old male  with history of severe aortic  stenosis. Preprocedural study prior to potential transcatheter  aortic valve replacement (TAVR) procedure.  EXAM:  CT ANGIOGRAPHY CHEST, ABDOMEN AND PELVIS  TECHNIQUE:  Multidetector CT imaging through the chest, abdomen and pelvis was  performed using the standard protocol during bolus administration of  intravenous contrast. Multiplanar reconstructed images and MIPs were  obtained and reviewed to evaluate the vascular anatomy.  CONTRAST: 16mL OMNIPAQUE IOHEXOL 350 MG/ML SOLN  COMPARISON: Chest CTA 08/30/2017.  FINDINGS:  Comment: Today's study is suboptimal related to poor contrast bolus  (which may be related to reduced cardiac output).  CTA CHEST FINDINGS  Cardiovascular: Heart size is mildly enlarged. There is no  significant pericardial fluid, thickening or pericardial  calcification. There is aortic atherosclerosis, as well as  atherosclerosis of the great vessels of the mediastinum and the  coronary arteries, including calcified atherosclerotic plaque in the  left main, left anterior descending, left circumflex and right  coronary arteries. Severe thickening calcification of the aortic  valve, compatible with reported clinical history of severe aortic  stenosis. Ectasia of the ascending thoracic aorta (4.3 cm in  diameter).  Mediastinum/Lymph Nodes: No pathologically enlarged mediastinal or  hilar lymph nodes. Esophagus is unremarkable in appearance. No  axillary lymphadenopathy.  Lungs/Pleura:  No acute consolidative airspace disease. No pleural  effusions. No definite suspicious appearing pulmonary nodules or  masses are noted.  Musculoskeletal/Soft Tissues: There are no aggressive appearing  lytic or blastic lesions noted in the visualized portions of the  skeleton.  CTA ABDOMEN AND PELVIS FINDINGS  Hepatobiliary: 2 liver lesions poorly characterized on today's early  arterial phase examination. The largest of these is in segment 4A of    the liver (axial image 73 of series 11) measuring 5.0 x 4.3 cm which  is centrally very low attenuation, but demonstrates a thick  peripheral rim of intermediate to higher attenuation. No intra or  extrahepatic biliary ductal dilatation. Gallbladder is normal in  appearance.  Pancreas: No pancreatic mass. No pancreatic ductal dilatation. No  pancreatic or peripancreatic fluid collections or inflammatory  changes.  Spleen: Unremarkable.  Adrenals/Urinary Tract: 1.8 cm low-attenuation lesion in the upper  pole the left kidney, compatible with a simple cyst. Subcentimeter  low-attenuation lesions noted elsewhere in both kidneys, too small  to definitively characterize, but statistically likely to represent  tiny cysts. No suspicious renal lesions. Bilateral adrenal glands  are normal in appearance. No hydroureteronephrosis. Urinary bladder  is unremarkable in appearance.  Stomach/Bowel: Normal appearance of the stomach. No pathologic  dilatation of small bowel or colon. Numerous colonic diverticulae  are noted, particularly in the sigmoid colon, without surrounding  inflammatory changes to suggest an acute diverticulitis at this  time. Normal appendix.  Vascular/Lymphatic: Severe aortic atherosclerosis, with vascular  findings and measurements pertinent to potential TAVR procedure, as  detailed above. Complex atheromatous plaque in the distal infrarenal  abdominal aorta with internal vascular webs and multiple channels  best appreciated at axial image 131 of series 11, which could  provide challenges for intravascular procedures. No lymphadenopathy  noted in the abdomen or pelvis.  Reproductive: Status post radical prostatectomy.  Other: No significant volume of ascites. No pneumoperitoneum.  Musculoskeletal: There are no aggressive appearing lytic or blastic  lesions noted in the visualized portions of the skeleton.  VASCULAR MEASUREMENTS PERTINENT TO TAVR:  AORTA:  Minimal Aortic  Diameter-1.2 x 1.5 mm  Severity of Aortic Calcification-moderate  RIGHT PELVIS:  Right Common Iliac Artery -  Minimal Diameter-5.4 x 4.4 mm  Tortuosity-mild  Calcification-moderate  Right External Iliac Artery -  Minimal Diameter-8.8 x 6.7 mm  Tortuosity-moderate to severe  Calcification-mild  Right Common Femoral Artery -  Minimal Diameter-7.3 x 7.2 mm  Tortuosity-mild  Calcification-mild  LEFT PELVIS:  Left Common Iliac Artery -  Minimal Diameter-8.5 x 5.8 mm  Tortuosity-mild  Calcification-moderate  Left External Iliac Artery -  Minimal Diameter-9.1 x 7.2 mm  Tortuosity-severe  Calcification-mild  Left Common Femoral Artery -  Minimal Diameter-6.2 x 6.0 mm  Tortuosity-mild  Calcification-mild  Review of the MIP images confirms the above findings.  IMPRESSION:  1. Vascular findings and measurements pertinent to potential TAVR  procedure, as detailed above. In addition to those findings, please  take note of the complex atheromatous plaque in the distal  infrarenal abdominal aorta where there are multiple vascular webs  which divide the aortic lumen into multiple channels. This may have  implications for intravascular procedures.  2. Severe thickening and calcification of the aortic valve,  compatible with the reported clinical history of severe aortic  stenosis.  3. Indeterminate liver lesions. Follow-up evaluation with  nonemergent abdominal MRI with and without IV gadolinium is  recommended in the near future to better characterize these findings  and exclude malignancy.  4. Aortic atherosclerosis, in addition to left main and 3 vessel  coronary artery disease.  5. 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  Recommend annual imaging followup by CTA or MRA. This recommendation  follows 2010 ACCF/AHA/AATS/ACR/ASA/SCA/SCAI/SIR/STS/SVM Guidelines  for the Diagnosis and Management of Patients with Thoracic Aortic  Disease. Circulation. 2010; [id number]. Aortic aneurysm NOS  (ICD10-I71.9).  6. Colonic diverticulosis without evidence of acute diverticulitis  at this time.  7. Additional incidental findings, as above.  Electronically Signed  By: Vinnie Langton M.D.  On: 11/02/2019 12:11    STS RISK CALCULATOR:  CABG/AVR:   Risk of Mortality:  5.128%  Renal Failure:  4.218%  Permanent Stroke:  4.279%  Prolonged Ventilation:  12.261%  DSW Infection:  0.159%  Reoperation:  4.122%  Morbidity or Mortality:  20.016%  Short Length of Stay:  14.324%  Long Length of Stay:  15.131%    Impression:   This 86 year old gentleman has stage D, severe, symptomatic aortic stenosis with [place] Heart Association class III symptoms of exertional fatigue and shortness of breath consistent with chronic diastolic heart failure. He has also had frequent episodes of dizziness and one episode of syncope. I have personally reviewed his 2D echocardiogram, cardiac catheterization, and CTA studies. His echocardiogram shows a calcified and restricted aortic valve with a mean transvalvular gradient of 30 mmHg and a peak gradient of 51 mmHg. Aortic valve area was measured at 0.67 cm. Dimensionless index was 0.21. Cardiac catheterization shows moderate diffuse calcific coronary disease which can be treated medically. There is been no significant change from his previous catheterization in 2018. He has had no anginal symptoms. I agree that aortic valve replacement is indicated in this patient for relief of his symptoms and to prevent progressive left ventricular deterioration. Given his age I think transcatheter aortic valve replacement would be the best treatment for him. His gated cardiac CTA shows anatomy suitable for transcatheter aortic valve replacement using a SAPIEN 3 valve. His abdominal and pelvic CTA shows complex atheromatous plaque in the distal infrarenal abdominal aorta with multiple vascular webs that divide the aortic lumen into multiple  channels. There is some calcium within the center of the aorta suggesting that this is a localized aortic dissection. I think this would significantly increase the risk of transfemoral insertion and therefore alternative access is indicated. He appears to have adequate left subclavian access for insertion of a 14 or 16 French sheath. He does have some enlargement of the ascending aorta with a maximum diameter of 4.3 cm.  The patient and his daughter were counseled at length regarding treatment alternatives for management of severe symptomatic aortic stenosis. The risks and benefits of surgical intervention has been discussed in detail. Long-term prognosis with medical therapy was discussed. Alternative approaches such as conventional surgical aortic valve replacement, transcatheter aortic valve replacement, and palliative medical therapy were compared and contrasted at length. This discussion was placed in the context of the patient's own specific clinical presentation and past medical history. All of their questions have been addressed.  Following the decision to proceed with transcatheter aortic valve replacement, a discussion was held regarding what types of management strategies would be attempted intraoperatively in the event of life-threatening complications, including whether or not the patient would be considered a candidate for the use of cardiopulmonary bypass and/or conversion to open sternotomy for attempted surgical intervention. He is in reasonably good condition for his age and has been relatively active until recently so I would  consider him a candidate for emergent sternotomy if needed to manage any intraoperative complications.  The patient has been advised of a variety of complications that might develop including but not limited to risks of death, stroke, paravalvular leak, aortic dissection or other major vascular complications, aortic annulus rupture, device embolization, cardiac rupture or  perforation, mitral regurgitation, acute myocardial infarction, arrhythmia, heart block or bradycardia requiring permanent pacemaker placement, congestive heart failure, respiratory failure, renal failure, pneumonia, infection, other late complications related to structural valve deterioration or migration, or other complications that might ultimately cause a temporary or permanent loss of functional independence or other long term morbidity. The patient provides full informed consent for the procedure as described and all questions were answered.   Plan:   Left subclavian transcatheter aortic valve replacement.  Gaye Pollack, MD

## 2019-11-15 ENCOUNTER — Inpatient Hospital Stay (HOSPITAL_COMMUNITY): Payer: Medicare Other

## 2019-11-15 ENCOUNTER — Ambulatory Visit (HOSPITAL_COMMUNITY): Payer: Medicare Other

## 2019-11-15 ENCOUNTER — Encounter (HOSPITAL_COMMUNITY)
Admission: RE | Disposition: A | Discharge status: Home / Self Care | Payer: Self-pay | Source: Home / Self Care | Attending: Surgery

## 2019-11-15 ENCOUNTER — Other Ambulatory Visit: Payer: Self-pay | Admitting: Physician Assistant

## 2019-11-15 ENCOUNTER — Inpatient Hospital Stay (HOSPITAL_COMMUNITY)
Admission: RE | Admit: 2019-11-15 | Discharge: 2019-11-16 | DRG: 267 | Disposition: A | Discharge status: Home / Self Care | Payer: Medicare Other | Attending: Surgery | Admitting: Surgery

## 2019-11-15 ENCOUNTER — Other Ambulatory Visit: Payer: Self-pay

## 2019-11-15 ENCOUNTER — Inpatient Hospital Stay (HOSPITAL_COMMUNITY): Payer: Medicare Other | Admitting: Physician Assistant

## 2019-11-15 ENCOUNTER — Encounter (HOSPITAL_COMMUNITY): Payer: Self-pay | Admitting: Cardiovascular Disease

## 2019-11-15 DIAGNOSIS — Z7901 Long term (current) use of anticoagulants: Secondary | ICD-10-CM | POA: Diagnosis not present

## 2019-11-15 DIAGNOSIS — Z006 Encounter for examination for normal comparison and control in clinical research program: Secondary | ICD-10-CM

## 2019-11-15 DIAGNOSIS — I083 Combined rheumatic disorders of mitral, aortic and tricuspid valves: Principal | ICD-10-CM | POA: Diagnosis present

## 2019-11-15 DIAGNOSIS — Z888 Allergy status to other drugs, medicaments and biological substances status: Secondary | ICD-10-CM

## 2019-11-15 DIAGNOSIS — I480 Paroxysmal atrial fibrillation: Secondary | ICD-10-CM | POA: Diagnosis not present

## 2019-11-15 DIAGNOSIS — Z952 Presence of prosthetic heart valve: Secondary | ICD-10-CM | POA: Diagnosis not present

## 2019-11-15 DIAGNOSIS — E1165 Type 2 diabetes mellitus with hyperglycemia: Secondary | ICD-10-CM | POA: Diagnosis not present

## 2019-11-15 DIAGNOSIS — Z8673 Personal history of transient ischemic attack (TIA), and cerebral infarction without residual deficits: Secondary | ICD-10-CM | POA: Diagnosis not present

## 2019-11-15 DIAGNOSIS — E119 Type 2 diabetes mellitus without complications: Secondary | ICD-10-CM

## 2019-11-15 DIAGNOSIS — Z79899 Other long term (current) drug therapy: Secondary | ICD-10-CM

## 2019-11-15 DIAGNOSIS — Z8551 Personal history of malignant neoplasm of bladder: Secondary | ICD-10-CM

## 2019-11-15 DIAGNOSIS — I352 Nonrheumatic aortic (valve) stenosis with insufficiency: Secondary | ICD-10-CM | POA: Diagnosis not present

## 2019-11-15 DIAGNOSIS — I447 Left bundle-branch block, unspecified: Secondary | ICD-10-CM | POA: Diagnosis not present

## 2019-11-15 DIAGNOSIS — I129 Hypertensive chronic kidney disease with stage 1 through stage 4 chronic kidney disease, or unspecified chronic kidney disease: Secondary | ICD-10-CM | POA: Diagnosis not present

## 2019-11-15 DIAGNOSIS — E785 Hyperlipidemia, unspecified: Secondary | ICD-10-CM | POA: Diagnosis present

## 2019-11-15 DIAGNOSIS — Z794 Long term (current) use of insulin: Secondary | ICD-10-CM | POA: Diagnosis not present

## 2019-11-15 DIAGNOSIS — N183 Chronic kidney disease, stage 3 unspecified: Secondary | ICD-10-CM | POA: Diagnosis present

## 2019-11-15 DIAGNOSIS — I130 Hypertensive heart and chronic kidney disease with heart failure and stage 1 through stage 4 chronic kidney disease, or unspecified chronic kidney disease: Secondary | ICD-10-CM | POA: Diagnosis not present

## 2019-11-15 DIAGNOSIS — N179 Acute kidney failure, unspecified: Secondary | ICD-10-CM | POA: Diagnosis present

## 2019-11-15 DIAGNOSIS — I10 Essential (primary) hypertension: Secondary | ICD-10-CM | POA: Diagnosis present

## 2019-11-15 DIAGNOSIS — I350 Nonrheumatic aortic (valve) stenosis: Secondary | ICD-10-CM

## 2019-11-15 DIAGNOSIS — I2510 Atherosclerotic heart disease of native coronary artery without angina pectoris: Secondary | ICD-10-CM | POA: Diagnosis not present

## 2019-11-15 DIAGNOSIS — E1122 Type 2 diabetes mellitus with diabetic chronic kidney disease: Secondary | ICD-10-CM | POA: Diagnosis present

## 2019-11-15 DIAGNOSIS — Z954 Presence of other heart-valve replacement: Secondary | ICD-10-CM | POA: Diagnosis not present

## 2019-11-15 DIAGNOSIS — I77810 Thoracic aortic ectasia: Secondary | ICD-10-CM | POA: Diagnosis not present

## 2019-11-15 DIAGNOSIS — Z452 Encounter for adjustment and management of vascular access device: Secondary | ICD-10-CM | POA: Diagnosis not present

## 2019-11-15 DIAGNOSIS — Z8249 Family history of ischemic heart disease and other diseases of the circulatory system: Secondary | ICD-10-CM | POA: Diagnosis not present

## 2019-11-15 DIAGNOSIS — C679 Malignant neoplasm of bladder, unspecified: Secondary | ICD-10-CM | POA: Diagnosis present

## 2019-11-15 DIAGNOSIS — Z8546 Personal history of malignant neoplasm of prostate: Secondary | ICD-10-CM | POA: Diagnosis not present

## 2019-11-15 DIAGNOSIS — I5042 Chronic combined systolic (congestive) and diastolic (congestive) heart failure: Secondary | ICD-10-CM | POA: Diagnosis not present

## 2019-11-15 HISTORY — DX: Presence of prosthetic heart valve: Z95.2

## 2019-11-15 HISTORY — PX: TEE WITHOUT CARDIOVERSION: SHX5443

## 2019-11-15 LAB — POCT I-STAT, CHEM 8
BUN: 17 mg/dL (ref 8–23)
Calcium, Ion: 1.13 mmol/L — ABNORMAL LOW (ref 1.15–1.40)
Chloride: 107 mmol/L (ref 98–111)
Creatinine, Ser: 1 mg/dL (ref 0.61–1.24)
Glucose, Bld: 146 mg/dL — ABNORMAL HIGH (ref 70–99)
HCT: 41 % (ref 39.0–52.0)
Hemoglobin: 13.9 g/dL (ref 13.0–17.0)
Potassium: 4 mmol/L (ref 3.5–5.1)
Sodium: 140 mmol/L (ref 135–145)
TCO2: 21 mmol/L — ABNORMAL LOW (ref 22–32)

## 2019-11-15 LAB — GLUCOSE, CAPILLARY
Glucose-Capillary: 120 mg/dL — ABNORMAL HIGH (ref 70–99)
Glucose-Capillary: 92 mg/dL (ref 70–99)
Glucose-Capillary: 73 mg/dL (ref 70–99)
Glucose-Capillary: 130 mg/dL — ABNORMAL HIGH (ref 70–99)

## 2019-11-15 LAB — PROTIME-INR
INR: 1 (ref 0.8–1.2)
Prothrombin Time: 12.9 seconds (ref 11.4–15.2)

## 2019-11-15 LAB — APTT: aPTT: 26 seconds (ref 24–36)

## 2019-11-15 SURGERY — IMPLANTATION, AORTIC VALVE, TRANSCATHETER, SUBCLAVIAN ARTERY APPROACH
Anesthesia: General | Site: Chest

## 2019-11-15 MED ORDER — VANCOMYCIN HCL IN DEXTROSE 1-5 GM/200ML-% IV SOLN
1000.0000 mg | Freq: Once | INTRAVENOUS | Status: AC
  Administered 2019-11-15: 1000 mg via INTRAVENOUS
  Filled 2019-11-15: qty 200

## 2019-11-15 MED ORDER — SODIUM CHLORIDE 0.9 % IR SOLN
Status: DC | PRN
  Administered 2019-11-15: 2000 mL

## 2019-11-15 MED ORDER — CHLORHEXIDINE GLUCONATE 4 % EX LIQD: 60.0000 mL | Freq: Once | CUTANEOUS | Status: DC

## 2019-11-15 MED ORDER — MORPHINE SULFATE (PF) 2 MG/ML IV SOLN: 1.0000 mg | INTRAVENOUS | Status: DC | PRN

## 2019-11-15 MED ORDER — DEXAMETHASONE SODIUM PHOSPHATE 10 MG/ML IJ SOLN
INTRAMUSCULAR | Status: AC
  Filled 2019-11-15: qty 1

## 2019-11-15 MED ORDER — PHENYLEPHRINE 40 MCG/ML (10ML) SYRINGE FOR IV PUSH (FOR BLOOD PRESSURE SUPPORT)
PREFILLED_SYRINGE | INTRAVENOUS | Status: AC
  Filled 2019-11-15: qty 20

## 2019-11-15 MED ORDER — PHENYLEPHRINE HCL (PRESSORS) 10 MG/ML IV SOLN
INTRAVENOUS | Status: DC | PRN
  Administered 2019-11-15 (×5): 80 ug via INTRAVENOUS

## 2019-11-15 MED ORDER — SUGAMMADEX SODIUM 200 MG/2ML IV SOLN
INTRAVENOUS | Status: DC | PRN
  Administered 2019-11-15: 180 mg via INTRAVENOUS

## 2019-11-15 MED ORDER — NITROGLYCERIN IN D5W 200-5 MCG/ML-% IV SOLN: 0.0000 ug/min | INTRAVENOUS | Status: DC

## 2019-11-15 MED ORDER — PRAVASTATIN SODIUM 10 MG PO TABS: 10.0000 mg | ORAL_TABLET | Freq: Every day | ORAL | Status: DC

## 2019-11-15 MED ORDER — SODIUM CHLORIDE 0.9 % IV SOLN: INTRAVENOUS | Status: DC | PRN

## 2019-11-15 MED ORDER — SODIUM CHLORIDE 0.9 % IV SOLN: INTRAVENOUS | Status: DC

## 2019-11-15 MED ORDER — EZETIMIBE 10 MG PO TABS
10.0000 mg | ORAL_TABLET | Freq: Every day | ORAL | Status: DC
  Administered 2019-11-16: 10 mg via ORAL
  Filled 2019-11-15: qty 1

## 2019-11-15 MED ORDER — MIDAZOLAM HCL 2 MG/2ML IJ SOLN
INTRAMUSCULAR | Status: AC
  Administered 2019-11-15: 1 mg via INTRAVENOUS
  Filled 2019-11-15: qty 2

## 2019-11-15 MED ORDER — FENTANYL CITRATE (PF) 100 MCG/2ML IJ SOLN
INTRAMUSCULAR | Status: AC
  Administered 2019-11-15: 50 ug via INTRAVENOUS
  Filled 2019-11-15: qty 2

## 2019-11-15 MED ORDER — SODIUM CHLORIDE 0.9% FLUSH
3.0000 mL | Freq: Two times a day (BID) | INTRAVENOUS | Status: DC
  Administered 2019-11-16: 3 mL via INTRAVENOUS

## 2019-11-15 MED ORDER — SODIUM CHLORIDE 0.9% FLUSH: 3.0000 mL | INTRAVENOUS | Status: DC | PRN

## 2019-11-15 MED ORDER — PROPOFOL 10 MG/ML IV BOLUS
INTRAVENOUS | Status: DC | PRN
  Administered 2019-11-15: 70 mg via INTRAVENOUS

## 2019-11-15 MED ORDER — OXYCODONE HCL 5 MG PO TABS: 5.0000 mg | ORAL_TABLET | ORAL | Status: DC | PRN

## 2019-11-15 MED ORDER — SODIUM CHLORIDE 0.9 % IV SOLN
1.5000 g | Freq: Two times a day (BID) | INTRAVENOUS | Status: DC
  Administered 2019-11-15 – 2019-11-16 (×2): 1.5 g via INTRAVENOUS
  Filled 2019-11-15 (×2): qty 1.5

## 2019-11-15 MED ORDER — EPHEDRINE 5 MG/ML INJ
INTRAVENOUS | Status: AC
  Filled 2019-11-15: qty 20

## 2019-11-15 MED ORDER — PROPOFOL 10 MG/ML IV BOLUS
INTRAVENOUS | Status: AC
  Filled 2019-11-15: qty 20

## 2019-11-15 MED ORDER — PHENYLEPHRINE 40 MCG/ML (10ML) SYRINGE FOR IV PUSH (FOR BLOOD PRESSURE SUPPORT): PREFILLED_SYRINGE | INTRAVENOUS | Status: DC | PRN

## 2019-11-15 MED ORDER — HEPARIN SODIUM (PORCINE) 1000 UNIT/ML IJ SOLN
INTRAMUSCULAR | Status: DC | PRN
  Administered 2019-11-15: 11000 [IU] via INTRAVENOUS

## 2019-11-15 MED ORDER — NITROGLYCERIN 0.2 MG/ML ON CALL CATH LAB
INTRAVENOUS | Status: DC | PRN
  Administered 2019-11-15: 20 ug via INTRAVENOUS
  Administered 2019-11-15: 40 ug via INTRAVENOUS
  Administered 2019-11-15: 60 ug via INTRAVENOUS
  Administered 2019-11-15: 80 ug via INTRAVENOUS

## 2019-11-15 MED ORDER — INSULIN ASPART 100 UNIT/ML ~~LOC~~ SOLN
0.0000 [IU] | Freq: Three times a day (TID) | SUBCUTANEOUS | Status: DC
  Administered 2019-11-15: 12 [IU] via SUBCUTANEOUS
  Administered 2019-11-16: 2 [IU] via SUBCUTANEOUS
  Administered 2019-11-16: 4 [IU] via SUBCUTANEOUS

## 2019-11-15 MED ORDER — ACETAMINOPHEN 325 MG PO TABS
650.0000 mg | ORAL_TABLET | Freq: Four times a day (QID) | ORAL | Status: DC | PRN
  Administered 2019-11-15: 650 mg via ORAL
  Filled 2019-11-15: qty 2

## 2019-11-15 MED ORDER — LACTATED RINGERS IV SOLN: INTRAVENOUS | Status: DC | PRN

## 2019-11-15 MED ORDER — FENTANYL CITRATE (PF) 250 MCG/5ML IJ SOLN
INTRAMUSCULAR | Status: AC
  Filled 2019-11-15: qty 5

## 2019-11-15 MED ORDER — EPHEDRINE SULFATE 50 MG/ML IJ SOLN
INTRAMUSCULAR | Status: DC | PRN
  Administered 2019-11-15 (×3): 5 mg via INTRAVENOUS
  Administered 2019-11-15: 10 mg via INTRAVENOUS
  Administered 2019-11-15: 5 mg via INTRAVENOUS

## 2019-11-15 MED ORDER — ONDANSETRON HCL 4 MG/2ML IJ SOLN: 4.0000 mg | Freq: Four times a day (QID) | INTRAMUSCULAR | Status: DC | PRN

## 2019-11-15 MED ORDER — ROCURONIUM BROMIDE 10 MG/ML (PF) SYRINGE
PREFILLED_SYRINGE | INTRAVENOUS | Status: AC
  Filled 2019-11-15: qty 10

## 2019-11-15 MED ORDER — ONDANSETRON HCL 4 MG/2ML IJ SOLN
INTRAMUSCULAR | Status: AC
  Filled 2019-11-15: qty 2

## 2019-11-15 MED ORDER — DEXAMETHASONE SODIUM PHOSPHATE 10 MG/ML IJ SOLN
INTRAMUSCULAR | Status: DC | PRN
  Administered 2019-11-15: 5 mg via INTRAVENOUS

## 2019-11-15 MED ORDER — IODIXANOL 320 MG/ML IV SOLN
INTRAVENOUS | Status: DC | PRN
  Administered 2019-11-15: 150 mL

## 2019-11-15 MED ORDER — TRAMADOL HCL 50 MG PO TABS: 50.0000 mg | ORAL_TABLET | ORAL | Status: DC | PRN

## 2019-11-15 MED ORDER — DEXTROSE 50 % IV SOLN
25.0000 mL | Freq: Once | INTRAVENOUS | Status: AC
  Filled 2019-11-15: qty 50

## 2019-11-15 MED ORDER — CLEVIDIPINE BUTYRATE 0.5 MG/ML IV EMUL
0.0000 mg/h | INTRAVENOUS | Status: DC
  Administered 2019-11-15: 2 mg/h via INTRAVENOUS
  Filled 2019-11-15 (×2): qty 100

## 2019-11-15 MED ORDER — SIMETHICONE 80 MG PO CHEW
80.0000 mg | CHEWABLE_TABLET | Freq: Four times a day (QID) | ORAL | Status: DC | PRN
  Administered 2019-11-15: 80 mg via ORAL
  Filled 2019-11-15: qty 1

## 2019-11-15 MED ORDER — ACETAMINOPHEN 650 MG RE SUPP: 650.0000 mg | Freq: Four times a day (QID) | RECTAL | Status: DC | PRN

## 2019-11-15 MED ORDER — SODIUM CHLORIDE 0.9 % IV SOLN: 250.0000 mL | INTRAVENOUS | Status: DC | PRN

## 2019-11-15 MED ORDER — LACTATED RINGERS IV SOLN: INTRAVENOUS | Status: DC

## 2019-11-15 MED ORDER — ONDANSETRON HCL 4 MG/2ML IJ SOLN
INTRAMUSCULAR | Status: DC | PRN
  Administered 2019-11-15: 4 mg via INTRAVENOUS

## 2019-11-15 MED ORDER — DEXTROSE 50 % IV SOLN
INTRAVENOUS | Status: AC
  Administered 2019-11-15: 25 mL via INTRAVENOUS
  Filled 2019-11-15: qty 50

## 2019-11-15 MED ORDER — CHLORHEXIDINE GLUCONATE 4 % EX LIQD: 30.0000 mL | CUTANEOUS | Status: DC

## 2019-11-15 MED ORDER — SODIUM CHLORIDE 0.9 % IV SOLN
INTRAVENOUS | Status: DC | PRN
  Administered 2019-11-15: 3000 mL

## 2019-11-15 MED ORDER — AMLODIPINE BESYLATE 10 MG PO TABS
10.0000 mg | ORAL_TABLET | Freq: Every day | ORAL | Status: DC
  Administered 2019-11-15 – 2019-11-16 (×2): 10 mg via ORAL
  Filled 2019-11-15 (×2): qty 1

## 2019-11-15 MED ORDER — NITROPRUSSIDE SODIUM-NACL 20-0.9 MG/100ML-% IV SOLN: INTRAVENOUS | Status: DC | PRN

## 2019-11-15 MED ORDER — MIDAZOLAM HCL 2 MG/2ML IJ SOLN: 1.0000 mg | Freq: Once | INTRAMUSCULAR | Status: AC

## 2019-11-15 MED ORDER — NITROGLYCERIN IN D5W 200-5 MCG/ML-% IV SOLN
INTRAVENOUS | Status: DC | PRN
  Administered 2019-11-15: 16.6 ug/min via INTRAVENOUS

## 2019-11-15 MED ORDER — AMIODARONE HCL 200 MG PO TABS
200.0000 mg | ORAL_TABLET | Freq: Every day | ORAL | Status: DC
  Administered 2019-11-16: 200 mg via ORAL
  Filled 2019-11-15: qty 1

## 2019-11-15 MED ORDER — ROCURONIUM BROMIDE 10 MG/ML (PF) SYRINGE
PREFILLED_SYRINGE | INTRAVENOUS | Status: DC | PRN
  Administered 2019-11-15: 40 mg via INTRAVENOUS
  Administered 2019-11-15: 60 mg via INTRAVENOUS

## 2019-11-15 MED ORDER — CHLORHEXIDINE GLUCONATE 0.12 % MT SOLN
15.0000 mL | Freq: Once | OROMUCOSAL | Status: AC
  Administered 2019-11-15: 15 mL via OROMUCOSAL
  Filled 2019-11-15: qty 15

## 2019-11-15 MED ORDER — FENTANYL CITRATE (PF) 250 MCG/5ML IJ SOLN
INTRAMUSCULAR | Status: DC | PRN
  Administered 2019-11-15: 50 ug via INTRAVENOUS
  Administered 2019-11-15: 100 ug via INTRAVENOUS
  Administered 2019-11-15: 50 ug via INTRAVENOUS

## 2019-11-15 MED ORDER — PHENYLEPHRINE HCL-NACL 20-0.9 MG/250ML-% IV SOLN
0.0000 ug/min | INTRAVENOUS | Status: DC
  Filled 2019-11-15: qty 250

## 2019-11-15 MED ORDER — SODIUM CHLORIDE 0.9 % IV SOLN
INTRAVENOUS | Status: AC
  Filled 2019-11-15 (×3): qty 1.2

## 2019-11-15 MED ORDER — ASPIRIN 81 MG PO CHEW
81.0000 mg | CHEWABLE_TABLET | Freq: Every day | ORAL | Status: DC
  Administered 2019-11-16: 81 mg via ORAL
  Filled 2019-11-15: qty 1

## 2019-11-15 MED ORDER — FENTANYL CITRATE (PF) 100 MCG/2ML IJ SOLN: 50.0000 ug | Freq: Once | INTRAMUSCULAR | Status: AC

## 2019-11-15 MED ORDER — PROTAMINE SULFATE 10 MG/ML IV SOLN
INTRAVENOUS | Status: DC | PRN
  Administered 2019-11-15: 90 mg via INTRAVENOUS
  Administered 2019-11-15: 20 mg via INTRAVENOUS

## 2019-11-15 SURGICAL SUPPLY — 104 items
ADH SKN CLS APL DERMABOND .7 (GAUZE/BANDAGES/DRESSINGS) ×2
BAG DECANTER FOR FLEXI CONT (MISCELLANEOUS) ×1 IMPLANT
BAG SNAP BAND KOVER 36X36 (MISCELLANEOUS) ×3 IMPLANT
BLADE CLIPPER SURG (BLADE) IMPLANT
BLADE OSCILLATING /SAGITTAL (BLADE) IMPLANT
BLADE STERNUM SYSTEM 6 (BLADE) IMPLANT
CABLE ADAPT CONN TEMP 6FT (ADAPTER) ×4 IMPLANT
CANNULA FEM VENOUS REMOTE 22FR (CANNULA) IMPLANT
CANNULA OPTISITE PERFUSION 16F (CANNULA) IMPLANT
CANNULA OPTISITE PERFUSION 18F (CANNULA) IMPLANT
CATH BEACON 5 .035 40 KMP TP (CATHETERS) IMPLANT
CATH BEACON 5 .038 40 KMP TP (CATHETERS) ×3
CATH DIAG EXPO 6F AL1 (CATHETERS) IMPLANT
CATH DIAG EXPO 6F VENT PIG 145 (CATHETERS) ×6 IMPLANT
CATH EXTERNAL FEMALE PUREWICK (CATHETERS) IMPLANT
CATH INFINITI 6F AL2 (CATHETERS) IMPLANT
CATH S G BIP PACING (CATHETERS) ×3 IMPLANT
CLIP VESOCCLUDE MED 24/CT (CLIP) ×1 IMPLANT
CLIP VESOCCLUDE SM WIDE 24/CT (CLIP) ×1 IMPLANT
CNTNR URN SCR LID CUP LEK RST (MISCELLANEOUS) ×4 IMPLANT
CONT SPEC 4OZ STRL OR WHT (MISCELLANEOUS) ×6
COVER BACK TABLE 24X17X13 BIG (DRAPES) IMPLANT
COVER BACK TABLE 80X110 HD (DRAPES) ×6 IMPLANT
COVER DOME SNAP 22 D (MISCELLANEOUS) ×1 IMPLANT
COVER WAND RF STERILE (DRAPES) ×3 IMPLANT
DERMABOND ADVANCED (GAUZE/BANDAGES/DRESSINGS) ×1
DERMABOND ADVANCED .7 DNX12 (GAUZE/BANDAGES/DRESSINGS) ×2 IMPLANT
DEVICE CLOSURE PERCLS PRGLD 6F (VASCULAR PRODUCTS) ×4 IMPLANT
DRAPE INCISE IOBAN 66X45 STRL (DRAPES) IMPLANT
DRSG TEGADERM 4X4.75 (GAUZE/BANDAGES/DRESSINGS) ×5 IMPLANT
ELECT CAUTERY BLADE 6.4 (BLADE) ×1 IMPLANT
ELECT REM PT RETURN 9FT ADLT (ELECTROSURGICAL) ×6
ELECTRODE REM PT RTRN 9FT ADLT (ELECTROSURGICAL) ×4 IMPLANT
FELT TEFLON 6X6 (MISCELLANEOUS) IMPLANT
FEMORAL VENOUS CANN RAP (CANNULA) IMPLANT
GAUZE SPONGE 2X2 8PLY STRL LF (GAUZE/BANDAGES/DRESSINGS) IMPLANT
GAUZE SPONGE 4X4 12PLY STRL (GAUZE/BANDAGES/DRESSINGS) ×3 IMPLANT
GLOVE BIO SURGEON STRL SZ7.5 (GLOVE) IMPLANT
GLOVE BIO SURGEON STRL SZ8 (GLOVE) ×1 IMPLANT
GLOVE EUDERMIC 7 POWDERFREE (GLOVE) ×1 IMPLANT
GLOVE INDICATOR 7.0 STRL GRN (GLOVE) ×1 IMPLANT
GLOVE ORTHO TXT STRL SZ7.5 (GLOVE) IMPLANT
GOWN STRL REUS W/ TWL LRG LVL3 (GOWN DISPOSABLE) IMPLANT
GOWN STRL REUS W/ TWL XL LVL3 (GOWN DISPOSABLE) ×2 IMPLANT
GOWN STRL REUS W/TWL LRG LVL3 (GOWN DISPOSABLE) ×12
GOWN STRL REUS W/TWL XL LVL3 (GOWN DISPOSABLE) ×6
GUIDEWIRE ANGLED .035X150CM (WIRE) ×1 IMPLANT
GUIDEWIRE SAFE TJ AMPLATZ EXST (WIRE) ×3 IMPLANT
INSERT FOGARTY SM (MISCELLANEOUS) IMPLANT
KIT BASIN OR (CUSTOM PROCEDURE TRAY) ×3 IMPLANT
KIT DILATOR VASC 18G NDL (KITS) IMPLANT
KIT HEART LEFT (KITS) ×3 IMPLANT
KIT SUCTION CATH 14FR (SUCTIONS) ×1 IMPLANT
KIT TURNOVER KIT B (KITS) ×3 IMPLANT
LOOP VESSEL MAXI BLUE (MISCELLANEOUS) IMPLANT
LOOP VESSEL MINI RED (MISCELLANEOUS) IMPLANT
NDL PERC 18GX7CM (NEEDLE) ×2 IMPLANT
NEEDLE 22X1 1/2 (OR ONLY) (NEEDLE) IMPLANT
NEEDLE PERC 18GX7CM (NEEDLE) ×3 IMPLANT
NS IRRIG 1000ML POUR BTL (IV SOLUTION) ×9 IMPLANT
PACK ENDOVASCULAR (PACKS) ×3 IMPLANT
PAD ARMBOARD 7.5X6 YLW CONV (MISCELLANEOUS) ×6 IMPLANT
PAD ELECT DEFIB RADIOL ZOLL (MISCELLANEOUS) ×3 IMPLANT
PENCIL BUTTON HOLSTER BLD 10FT (ELECTRODE) ×3 IMPLANT
PERCLOSE PROGLIDE 6F (VASCULAR PRODUCTS)
POSITIONER HEAD DONUT 9IN (MISCELLANEOUS) ×3 IMPLANT
SET MICROPUNCTURE 5F STIFF (MISCELLANEOUS) ×3 IMPLANT
SHEATH BRITE TIP 6FR 35CM (SHEATH) ×2 IMPLANT
SHEATH PINNACLE 6F 10CM (SHEATH) ×4 IMPLANT
SHEATH PINNACLE 8F 10CM (SHEATH) ×3 IMPLANT
SLEEVE REPOSITIONING LENGTH 30 (MISCELLANEOUS) ×3 IMPLANT
SPONGE GAUZE 2X2 STER 10/PKG (GAUZE/BANDAGES/DRESSINGS) ×1
SPONGE LAP 4X18 RFD (DISPOSABLE) IMPLANT
STOPCOCK MORSE 400PSI 3WAY (MISCELLANEOUS) ×6 IMPLANT
SUT ETHIBOND X763 2 0 SH 1 (SUTURE) IMPLANT
SUT GORETEX CV 4 TH 22 36 (SUTURE) IMPLANT
SUT GORETEX CV4 TH-18 (SUTURE) IMPLANT
SUT MNCRL AB 3-0 PS2 18 (SUTURE) IMPLANT
SUT PROLENE 5 0 C 1 36 (SUTURE) IMPLANT
SUT PROLENE 6 0 C 1 30 (SUTURE) IMPLANT
SUT SILK  1 MH (SUTURE) ×3
SUT SILK 1 MH (SUTURE) ×2 IMPLANT
SUT SILK 2 0 SH CR/8 (SUTURE) ×1 IMPLANT
SUT SILK 3 0 (SUTURE) ×3
SUT SILK 3-0 18XBRD TIE 12 (SUTURE) IMPLANT
SUT VIC AB 2-0 CT1 27 (SUTURE)
SUT VIC AB 2-0 CT1 TAPERPNT 27 (SUTURE) IMPLANT
SUT VIC AB 2-0 CTX 36 (SUTURE) ×2 IMPLANT
SUT VIC AB 3-0 SH 27 (SUTURE) ×3
SUT VIC AB 3-0 SH 27X BRD (SUTURE) IMPLANT
SUT VIC AB 3-0 SH 8-18 (SUTURE) IMPLANT
SUT VIC AB 3-0 X1 27 (SUTURE) ×1 IMPLANT
SYR 50ML LL SCALE MARK (SYRINGE) ×3 IMPLANT
SYR BULB IRRIGATION 50ML (SYRINGE) ×1 IMPLANT
SYR CONTROL 10ML LL (SYRINGE) IMPLANT
TOWEL GREEN STERILE (TOWEL DISPOSABLE) ×3 IMPLANT
TOWEL GREEN STERILE FF (TOWEL DISPOSABLE) ×3 IMPLANT
TRANSDUCER W/STOPCOCK (MISCELLANEOUS) ×6 IMPLANT
TRAY FOLEY SLVR 16FR TEMP STAT (SET/KITS/TRAYS/PACK) IMPLANT
VALVE HEART TRANSCATH SZ3 29MM (Valve) ×1 IMPLANT
WIRE EMERALD 3MM-J .035X150CM (WIRE) ×3 IMPLANT
WIRE EMERALD 3MM-J .035X260CM (WIRE) ×3 IMPLANT
WIRE HI TORQ VERSACORE J 260CM (WIRE) ×1 IMPLANT
WIRE ROSEN-J .035X260CM (WIRE) ×1 IMPLANT

## 2019-11-15 NOTE — Transfer of Care (Signed)
Immediate Anesthesia Transfer of Care Note  Patient: Bernard Kim  Procedure(s) Performed: TRANSCATHETER AORTIC VALVE REPLACEMENT, LEFT SUBCLAVIAN APPROACH (Left Chest) TRANSESOPHAGEAL ECHOCARDIOGRAM (TEE) (N/A )  Patient Location: Cath Lab  Anesthesia Type:General  Level of Consciousness: awake and alert   Airway & Oxygen Therapy: Patient Spontanous Breathing and Patient connected to face mask oxygen  Post-op Assessment: Report given to RN and Post -op Vital signs reviewed and stable  Post vital signs: Reviewed and stable  Last Vitals:  Vitals Value Taken Time  BP 126/49 11/15/19 1522  Temp    Pulse 67 11/15/19 1524  Resp 16 11/15/19 1524  SpO2 97 % 11/15/19 1524  Vitals shown include unvalidated device data.  Last Pain:  Vitals:   11/15/19 0925  TempSrc:   PainSc: 0-No pain         Complications: No apparent anesthesia complications

## 2019-11-15 NOTE — OR Nursing (Signed)
Arterial sheath pulled by Levada Schilling @ 1430 Venous sheath pulled by N. Dalton @ 1435  Manual pressure applied by Levada Schilling for 10 minutes until hemostasis achieved.

## 2019-11-15 NOTE — Progress Notes (Signed)
R radial art line was pulled, and manual pressure was held for 10 min. There is a small bruise at the insertion site. No swelling or hematoma. 2+ R radial pulse.

## 2019-11-15 NOTE — Anesthesia Procedure Notes (Addendum)
Procedure Name: Intubation Date/Time: 11/15/2019 12:32 PM Performed by: Bryson Corona, CRNA Pre-anesthesia Checklist: Patient identified, Emergency Drugs available, Suction available and Patient being monitored Patient Re-evaluated:Patient Re-evaluated prior to induction Oxygen Delivery Method: Circle System Utilized Preoxygenation: Pre-oxygenation with 100% oxygen Induction Type: IV induction Ventilation: Mask ventilation without difficulty Laryngoscope Size: Mac and 4 Grade View: Grade I Tube type: Oral Tube size: 8.0 mm Number of attempts: 1 Airway Equipment and Method: Stylet and Oral airway Placement Confirmation: ETT inserted through vocal cords under direct vision,  positive ETCO2 and breath sounds checked- equal and bilateral Secured at: 22 cm Tube secured with: Tape Dental Injury: Teeth and Oropharynx as per pre-operative assessment  Comments: Orion performed

## 2019-11-15 NOTE — Anesthesia Postprocedure Evaluation (Signed)
Anesthesia Post Note  Patient: Bernard Kim  Procedure(s) Performed: TRANSCATHETER AORTIC VALVE REPLACEMENT, LEFT SUBCLAVIAN APPROACH (Left Chest) TRANSESOPHAGEAL ECHOCARDIOGRAM (TEE) (N/A )     Patient location during evaluation: Cath Lab Anesthesia Type: General Level of consciousness: awake and alert, oriented and patient cooperative Pain management: pain level controlled Vital Signs Assessment: post-procedure vital signs reviewed and stable Respiratory status: spontaneous breathing, nonlabored ventilation, respiratory function stable and patient connected to nasal cannula oxygen Cardiovascular status: blood pressure returned to baseline and stable Postop Assessment: no apparent nausea or vomiting Anesthetic complications: no    Last Vitals:  Vitals:   11/15/19 1519 11/15/19 1611  BP: (!) 123/44 (!) 152/66  Pulse: 70 66  Resp: 16 14  Temp: 36.4 C 36.4 C  SpO2:      Last Pain:  Vitals:   11/15/19 1611  TempSrc: Temporal  PainSc: 0-No pain                 Harrison Paulson,E. Johnay Mano

## 2019-11-15 NOTE — Progress Notes (Signed)
Pt received from cath lab. VSS. Pt oriented to room and unit. Call light in reach.  Clyde Canterbury, RN

## 2019-11-15 NOTE — Progress Notes (Signed)
[facility]   MULTIDISCIPLINARY HEART VALVE TEAM   TAVR OPERATIVE NOTE   Date of Procedure:  11/15/2019  Preoperative Diagnosis: Severe Aortic Stenosis   Postoperative Diagnosis: Same   Procedure:    Transcatheter Aortic Valve Replacement - Open Transaxillary Approach  Edwards Sapien 3 THV (size 29 mm, model # 9600TFX, serial # [id number])   Co-Surgeons:  Gaye Pollack, MD and Sherren Mocha, MD  Anesthesiologist:  Midge Minium, MD  Echocardiographer:  Sanda Klein, MD  Pre-operative Echo Findings:  Severe aortic stenosis  Normal left ventricular systolic function  Post-operative Echo Findings:  Mild paravalvular leak  Unchanged/normal left ventricular systolic function  BRIEF CLINICAL NOTE AND INDICATIONS FOR SURGERY  86 year old male with hypertension, paroxysmal atrial fibrillation on chronic oral anticoagulation with apixaban, and chronic combined systolic and diastolic heart failure.  He has developed progressive and now severe aortic stenosis, presenting today for transcatheter aortic valve replacement via a left transaxillary approach.  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  The patient's LV function has normalized with an LVEF of 60 to 65%.  Echo and cardiac catheterization data have both demonstrated findings consistent with severe aortic stenosis.  During the course of the patient's preoperative work up they have been evaluated comprehensively by a multidisciplinary team of specialists coordinated through the [facility] in the [facility] [place].  They have been demonstrated to suffer from symptomatic severe aortic stenosis as noted above. The patient has been counseled extensively as to the relative risks and benefits of all options for the treatment of severe aortic stenosis including long  term medical therapy, conventional surgery for aortic valve replacement, and transcatheter aortic valve replacement.  The patient has been independently evaluated in formal cardiac surgical consultation by Dr Dr Cyndia Bent, who deemed the patient appropriate for TAVR. Based upon review of all of the patient's preoperative diagnostic tests they are felt to be candidate for transcatheter aortic valve replacement using the transfemoral approach as an alternative to conventional surgery.    Following the decision to proceed with transcatheter aortic valve replacement, a discussion has been held regarding what types of management strategies would be attempted intraoperatively in the event of life-threatening complications, including whether or not the patient would be considered a candidate for the use of cardiopulmonary bypass and/or conversion to open sternotomy for attempted surgical intervention.  The patient has been advised of a variety of complications that might develop peculiar to this approach including but not limited to risks of death, stroke, paravalvular leak, aortic dissection or other major vascular complications, aortic annulus rupture, device embolization, cardiac rupture or perforation, acute myocardial infarction, arrhythmia, heart block or bradycardia requiring permanent pacemaker placement, congestive heart failure, respiratory failure, renal failure, pneumonia, infection, other late complications related to structural valve deterioration or migration, or other complications that might ultimately cause a temporary or permanent loss of functional independence or other long term morbidity.  The patient provides full informed consent for the procedure as described and all questions were answered preoperatively.  DETAILS OF THE OPERATIVE PROCEDURE  PREPARATION:   The patient is brought to the operating room on the above mentioned date and central monitoring was established by the anesthesia team  including placement of a central venous catheter and radial arterial line. The patient is placed in the supine position on the operating table.  Intravenous antibiotics are administered. General endotracheal anesthesia is  induced uneventfully.  A Foley catheter is placed.  Baseline transesophageal echocardiogram is performed. The patient's chest, abdomen, both groins, and both lower extremities are prepared and draped in a sterile manner. A time out procedure is performed.   PERIPHERAL ACCESS:   Using ultrasound guidance, femoral arterial and venous access is obtained with placement of 6 Fr sheaths on the left side.  A pigtail diagnostic catheter was passed through the femoral arterial sheath under fluoroscopic guidance into the aortic root.  A temporary transvenous pacemaker catheter was passed through the femoral venous sheath under fluoroscopic guidance into the right ventricle.  The pacemaker was tested to ensure stable lead placement and pacemaker capture. Aortic root angiography was performed in order to determine the optimal angiographic angle for valve deployment.  TRANSAXILLARY ACCESS:  Please see Dr Vivi Martens complete note.     BALLOON AORTIC VALVULOPLASTY:  Not performed  TRANSCATHETER HEART VALVE DEPLOYMENT:  The left subclavian artery is exposed by Dr. Cyndia Bent.  An 18-gauge needle is used to access the vessel and J-wire is advanced into the aortic root.  A 6 French sheath is inserted.  An AL-1 catheter was used to direct a straight-tip wire across the aortic valve.  This is changed out for a pigtail catheter and an Amplatz extra-stiff wire is inserted with a curve in the LV apex.  A 16 French E sheath is then inserted over the extra-stiff wire into the aortic arch beyond the subclavian artery origin.  An Edwards Sapien 3 transcatheter heart valve (size 29 mm) was prepared and crimped per manufacturer's guidelines, and the proper orientation of the valve is confirmed on the [employer]  delivery system. The valve was advanced through the introducer sheath using normal technique until in an appropriate position in the thoracic aorta beyond the sheath tip. The balloon was then retracted and using the fine-tuning wheel was centered on the valve. The valve was carefully positioned across the aortic valve annulus. The Commander catheter was retracted using normal technique. Once final position of the valve has been confirmed by angiographic assessment, the valve is deployed while temporarily holding ventilation and during rapid ventricular pacing to maintain systolic blood pressure < 50 mmHg and pulse pressure < 10 mmHg. The balloon inflation is held for >3 seconds after reaching full deployment volume. Once the balloon has fully deflated the balloon is retracted into the ascending aorta and valve function is assessed using echocardiography. The patient's hemodynamic recovery following valve deployment is good.  The deployment balloon and guidewire are both removed. Echo demostrated acceptable post-procedural gradients, stable mitral valve function, and mild to moderate aortic insufficiency.  Because of residual paravalvular insufficiency, 2 cc of contrast are added to the atria on device and the valve is postdilated.  This improves the degree of aortic insufficiency and decreased it into the mild range.   PROCEDURE COMPLETION:  Please see the complete note of Dr. Cyndia Bent.  The temporary pacemaker and pigtail catheters are removed.  Manual pressure was used for femoral arterial and venous hemostasis.  The patient tolerated the procedure well and is transported to the surgical intensive care in stable condition. There were no immediate intraoperative complications. All sponge instrument and needle counts are verified correct at completion of the operation.   The patient received a total of 45 mL of intravenous contrast during the procedure.   Sherren Mocha, MD 11/15/2019 4:39 PM

## 2019-11-15 NOTE — Progress Notes (Signed)
  [place] VALVE TEAM  Patient doing well s/p TAVR. He is hemodynamically stable, but hypertensive requiring a nitro gtt. Home lasix and losartan were discontinued pre procedure given AKI. Will start amlodipine 10mg  daily until we see his labs tomorrow. Groin site stable. ECG with new LBBB but no high grade block. Plan to DC arterial line and transfer to 4E. Plan for early ambulation after bedrest completed and hopeful discharge over the next 24-48 hours.   Angelena Form PA-C  [facility]  Pager [phone]

## 2019-11-15 NOTE — Discharge Instructions (Signed)
ACTIVITY AND EXERCISE °• Daily activity and exercise are an important part of your recovery. People recover at different rates depending on their general health and type of valve procedure. °• Most people recovering from TAVR feel better relatively quickly  °• No lifting, pushing, pulling more than 10 pounds (examples to avoid: groceries, vacuuming, gardening, golfing): °            - For one week with a procedure through the groin. °            - For six weeks for procedures through the chest wall or neck. °NOTE: You will typically see one of our providers 7-14 days after your procedure to discuss WHEN TO RESUME the above activities.  °  °  °DRIVING °• Do not drive until you are seen for follow up and cleared by a provider. Generally, we ask patient to not drive for 1 week after their procedure. °• If you have been told by your doctor in the past that you may not drive, you must talk with him/her before you begin driving again. °  °DRESSING °• Groin site: you may leave the clear dressing over the site for up to one week or until it falls off. °  °HYGIENE °• If you had a femoral (leg) procedure, you may take a shower when you return home. After the shower, pat the site dry. Do NOT use powder, oils or lotions in your groin area until the site has completely healed. °• If you had a chest procedure, you may shower when you return home unless specifically instructed not to by your discharging practitioner. °            - DO NOT scrub incision; pat dry with a towel. °            - DO NOT apply any lotions, oils, powders to the incision. °            - No tub baths / swimming for at least 2 weeks. °• If you notice any fevers, chills, increased pain, swelling, bleeding or pus, please contact your doctor. °  °ADDITIONAL INFORMATION °• If you are going to have an upcoming dental procedure, please contact our office as you will require antibiotics ahead of time to prevent infection on your heart valve.  ° ° °If you have any  questions or concerns you can call the structural heart phone during normal business hours 8am-4pm. If you have an urgent need after hours or weekends please call [phone] to talk to the on call provider for general cardiology. If you have an emergency that requires immediate attention, please call 911.  ° ° °After TAVR Checklist ° °Check  Test Description  ° Follow up appointment in 1-2 weeks  You will see our structural heart physician assistant, Bernard Kim. Your incision sites will be checked and you will be cleared to drive and resume all normal activities if you are doing well.    ° 1 month echo and follow up  You will have an echo to check on your new heart valve and be seen back in the office by Bernard Kim. Many times the echo is not read by your appointment time, but Bernard will call you later that day or the following day to report your results.  ° Follow up with your primary cardiologist You will need to be seen by your primary cardiologist in the following 3-6 months after your 1 month appointment in the valve   clinic. Often times your Plavix or Aspirin will be discontinued during this time, but this is decided on a case by case basis.   ° 1 year echo and follow up You will have another echo to check on your heart valve after 1 year and be seen back in the office by Bernard Kim. This your last structural heart visit.  ° Bacterial endocarditis prophylaxis  You will have to take antibiotics for the rest of your life before all dental procedures (even teeth cleanings) to protect your heart valve. Antibiotics are also required before some surgeries. Please check with your cardiologist before scheduling any surgeries. Also, please make sure to tell us if you have a penicillin allergy as you will require an alternative antibiotic.   ° ° °

## 2019-11-15 NOTE — Interval H&P Note (Signed)
History and Physical Interval Note:  11/15/2019 12:35 PM  Bernard Kim  has presented today for surgery, with the diagnosis of Severe Aortic Stenosis.  The various methods of treatment have been discussed with the patient and family. After consideration of risks, benefits and other options for treatment, the patient has consented to  Procedure(s): TRANSCATHETER AORTIC VALVE REPLACEMENT, LEFT SUBCLAVIAN APPROACH (Left) TRANSESOPHAGEAL ECHOCARDIOGRAM (TEE) (N/A) as a surgical intervention.  The patient's history has been reviewed, patient examined, no change in status, stable for surgery.  I have reviewed the patient's chart and labs.  Questions were answered to the patient's satisfaction.     Gaye Pollack

## 2019-11-15 NOTE — Anesthesia Procedure Notes (Signed)
Central Venous Catheter Insertion Performed by: Annye Asa, MD, anesthesiologist Start/End3/10/2019 12:41 PM, 11/15/2019 12:56 PM Patient location: Pre-op. Preanesthetic checklist: patient identified, IV checked, risks and benefits discussed, surgical consent, monitors and equipment checked, pre-op evaluation, timeout performed and anesthesia consent Position: supine Lidocaine 1% used for infiltration and patient sedated Hand hygiene performed , maximum sterile barriers used  and Seldinger technique used Catheter size: 8 Fr Total catheter length 16. Central line was placed.Double lumen Procedure performed using ultrasound guided technique. Ultrasound Notes:anatomy identified, needle tip was noted to be adjacent to the nerve/plexus identified, no ultrasound evidence of intravascular and/or intraneural injection and image(s) printed for medical record Attempts: 1 Following insertion, line sutured, dressing applied and Biopatch. Post procedure assessment: blood return through all ports, free fluid flow and no air  Patient tolerated the procedure well with no immediate complications. Additional procedure comments: CVP: Timeout, sterile prep, drape, FBP R neck.  Supine position.  1% lido local, finder and trocar RIJ 1st pass with US guidance.  2 lumen placed over J wire. Biopatch and sterile dressing on.  Patient tolerated well.  VSS.  Jenita Seashore, MD.

## 2019-11-15 NOTE — Anesthesia Preprocedure Evaluation (Addendum)
Anesthesia Evaluation  Patient identified by MRN, date of birth, ID band Patient awake    Reviewed: Allergy & Precautions, NPO status , Patient's Chart, lab work & pertinent test results  History of Anesthesia Complications Negative for: history of anesthetic complications  Airway Mallampati: I  TM Distance: >3 FB Neck ROM: Full    Dental  (+) Edentulous Upper, Edentulous Lower   Pulmonary neg pulmonary ROS,  11/11/2019 SARS coronavirus NEG   breath sounds clear to auscultation       Cardiovascular hypertension, Pt. on medications (-) angina+ CAD  + dysrhythmias Atrial Fibrillation + Valvular Problems/Murmurs AS  Rhythm:Irregular Rate:Normal + Systolic murmurs [id number] Cath: normal LV function, mod 40% LM, 60% LAD, severe AS with mean grad 29 mmHg, AVA 0.98 cm2  '20 ECHO: EF 60-65%, mild MR, mild AI, Mod-severe AS with mean gradient 30.0 mmHg, peak gradient 51.0 mmHg. AVA 0.67 cm.    Neuro/Psych negative neurological ROS     GI/Hepatic negative GI ROS, Neg liver ROS,   Endo/Other  diabetes (glu 92), Insulin Dependent  Renal/GU Renal InsufficiencyRenal disease (creat 1.61)   Bladder cancer    Musculoskeletal   Abdominal   Peds  Hematology eliquis   Anesthesia Other Findings   Reproductive/Obstetrics                            Anesthesia Physical Anesthesia Plan  ASA: III  Anesthesia Plan: General   Post-op Pain Management:    Induction: Intravenous  PONV Risk Score and Plan: 2 and Ondansetron and Dexamethasone  Airway Management Planned: Oral ETT  Additional Equipment: Arterial line, CVP and Ultrasound Guidance Line Placement  Intra-op Plan:   Post-operative Plan: Extubation in OR  Informed Consent: I have reviewed the patients History and Physical, chart, labs and discussed the procedure including the risks, benefits and alternatives for the proposed anesthesia with the  patient or authorized representative who has indicated his/her understanding and acceptance.     Dental advisory given  Plan Discussed with: CRNA and Surgeon  Anesthesia Plan Comments:        Anesthesia Quick Evaluation

## 2019-11-15 NOTE — Anesthesia Procedure Notes (Signed)
Arterial Line Insertion Start/End3/10/2019 10:25 AM, 11/15/2019 10:30 AM Performed by: Wilburn Cornelia, CRNA, CRNA  Preanesthetic checklist: patient identified, IV checked, site marked, risks and benefits discussed, surgical consent, monitors and equipment checked, pre-op evaluation, timeout performed and anesthesia consent Lidocaine 1% used for infiltration Right, radial was placed Catheter size: 20 G Hand hygiene performed  and maximum sterile barriers used  Allen's test indicative of satisfactory collateral circulation Attempts: 1 Procedure performed without using ultrasound guided technique. Following insertion, Biopatch. Post procedure assessment: normal  Patient tolerated the procedure well with no immediate complications.

## 2019-11-15 NOTE — Op Note (Signed)
[facility]   MULTIDISCIPLINARY HEART VALVE TEAM   TAVR OPERATIVE NOTE   Date of Procedure:  11/15/2019  Preoperative Diagnosis: Severe Aortic Stenosis   Postoperative Diagnosis: Same   Procedure:    Transcatheter Aortic Valve Replacement - Left Subclavian Artery Approach  Edwards Sapien 3 THV (size 29 mm, model #9600TFX, serial # [id number])   Co-Surgeons:  Gaye Pollack, MD and Sherren Mocha, MD   Anesthesiologist:  Annye Asa, MD  Echocardiographer:  Jerilynn Mages. Croitoru, MD  Pre-operative Echo Findings:  Severe aortic stenosis  Normal left ventricular systolic function  Post-operative Echo Findings:  mild paravalvular leak  Normal left ventricular systolic function   BRIEF CLINICAL NOTE AND INDICATIONS FOR SURGERY  This 86 year old gentleman has stage D, severe, symptomatic aortic stenosis with [place] Heart Association class III symptoms of exertional fatigue and shortness of breath consistent with chronic diastolic heart failure. He has also had frequent episodes of dizziness and one episode of syncope. I have personally reviewed his 2D echocardiogram, cardiac catheterization, and CTA studies. His echocardiogram shows a calcified and restricted aortic valve with a mean transvalvular gradient of 30 mmHg and a peak gradient of 51 mmHg. Aortic valve area was measured at 0.67 cm. Dimensionless index was 0.21. Cardiac catheterization shows moderate diffuse calcific coronary disease which can be treated medically. There is been no significant change from his previous catheterization in 2018. He has had no anginal symptoms. I agree that aortic valve replacement is indicated in this patient for relief of his symptoms and to prevent progressive left ventricular deterioration. Given his age I think transcatheter aortic valve replacement would be the best treatment for him. His gated cardiac CTA shows anatomy suitable for transcatheter aortic valve replacement using a  SAPIEN 3 valve. His abdominal and pelvic CTA shows complex atheromatous plaque in the distal infrarenal abdominal aorta with multiple vascular webs that divide the aortic lumen into multiple channels. There is some calcium within the center of the aorta suggesting that this is a localized aortic dissection. I think this would significantly increase the risk of transfemoral insertion and therefore alternative access is indicated. He appears to have adequate left subclavian access for insertion of a 14 or 16 French sheath. He does have some enlargement of the ascending aorta with a maximum diameter of 4.3 cm.  The patient and his daughter were counseled at length regarding treatment alternatives for management of severe symptomatic aortic stenosis. The risks and benefits of surgical intervention has been discussed in detail. Long-term prognosis with medical therapy was discussed. Alternative approaches such as conventional surgical aortic valve replacement, transcatheter aortic valve replacement, and palliative medical therapy were compared and contrasted at length. This discussion was placed in the context of the patient's own specific clinical presentation and past medical history. All of their questions have been addressed.  Following the decision to proceed with transcatheter aortic valve replacement, a discussion was held regarding what types of management strategies would be attempted intraoperatively in the event of life-threatening complications, including whether or not the patient would be considered a candidate for the use of cardiopulmonary bypass and/or conversion to open sternotomy for attempted surgical intervention. He is in reasonably good condition for his age and has been relatively active until recently so I would consider him a candidate for emergent sternotomy if needed to manage any intraoperative complications.  The patient has been advised of a variety of complications that might develop  including but not limited to risks of death, stroke,  paravalvular leak, aortic dissection or other major vascular complications, aortic annulus rupture, device embolization, cardiac rupture or perforation, mitral regurgitation, acute myocardial infarction, arrhythmia, heart block or bradycardia requiring permanent pacemaker placement, congestive heart failure, respiratory failure, renal failure, pneumonia, infection, other late complications related to structural valve deterioration or migration, or other complications that might ultimately cause a temporary or permanent loss of functional independence or other long term morbidity. The patient provides full informed consent for the procedure as described and all questions were answered.     DETAILS OF THE OPERATIVE PROCEDURE  PREPARATION:    The patient is brought to the operating room on the above mentioned date and appropriate monitoring was established by the anesthesia team. The patient is placed in the supine position on the operating table.  Intravenous antibiotics are administered.  General endotracheal anesthesia is induced uneventfully.    Baseline transesophageal echocardiogram was performed. The patient's chest, abdomen, both groins, and both lower extremities are prepared and draped in a sterile manner. A time out procedure is performed.   PERIPHERAL ACCESS:    Using the modified Seldinger technique, femoral arterial and venous access was obtained with placement of 6 Fr sheaths on the left side.  A pigtail diagnostic catheter was passed through the left arterial sheath under fluoroscopic guidance into the aortic root.  A temporary transvenous pacemaker catheter was passed through the left femoral venous sheath under fluoroscopic guidance into the right ventricle.  The pacemaker was tested to ensure stable lead placement and pacemaker capture. Aortic root angiography was performed in order to determine the optimal angiographic angle for  valve deploymen   TRANS SUBCLAVIAN ACCESS:  A transverse incision was made below the left clavicle. The pectoralis major muscle was split along its fibers and the pectoralis minor muscle was retracted laterally. The brachial plexus was identified and gently retracted laterally to expose the axillary artery. The artery was controlled with vessel loops. The patient was heparinized systemically and ACT verified > 250 seconds.    A double concentric purse string suture of CV-4 Gortex suture with small Gortex pledgetts was placed in the left axillary artery. The artery was cannulated with a needle within the purse string sutures and a J-wire was passed into the left axillary artery and advanced into the ascending aorta. The needle was removed and an 8-F sheath inserted over the wire. A straight wire and AL-1 catheter were passed through the sheath and across the valve. The AL-1 was exchanged for a pigtail catheter. Simultaneous LV and Ao pressures were recorded.  The pigtail catheter was exchanged for an Amplatz Extra-stiff wire in the LV apex. Then the pigtail was removed and the Pierce was inserted after dilation of the axillary/subclavian artery. The tip of the sheath was advanced into the aortic arch.    TRANSCATHETER HEART VALVE DEPLOYMENT:   An Edwards Sapien 3 transcatheter heart valve (size 29 mm) was prepared and crimped per manufacturer's guidelines, and the proper orientation of the valve is confirmed on the [employer] delivery system. The valve was advanced through the introducer sheath using normal technique until in an appropriate position in the ascending aorta beyond the sheath tip. The balloon was then retracted and using the fine-tuning wheel was centered on the valve. The valve was then advanced across the native valve using appropriate flexion of the catheter. The valve was carefully positioned across the aortic valve annulus. The Commander catheter was retracted  using normal technique. Once final position of the  valve has been confirmed by angiographic assessment, the valve is deployed while temporarily holding ventilation and during rapid ventricular pacing to maintain systolic blood pressure < 50 mmHg and pulse pressure < 10 mmHg. The balloon inflation is held for >3 seconds after reaching full deployment volume. Once the balloon has fully deflated the balloon is retracted into the ascending aorta and valve function is assessed using echocardiography. There is felt to be mild to moderate paravalvular leak and no central aortic insufficiency. 2 cc of contrast are added to the atrion device and the valve is postdilated.  This improved the paravalvular leak to mild. Post-procedural gradient was acceptable.  The patient's hemodynamic recovery following valve deployment is good.  The deployment balloon and guidewire are both removed.    PROCEDURE COMPLETION:   The sheath was removed and subclavian artery closure performed.  Protamine was administered once subclavian arterial repair was complete. The temporary pacemaker, pigtail catheters and femoral sheaths were removed with manual pressure used for hemostasis.  A Mynx femoral closure device was utilized following removal of the diagnostic sheath in the left femoral artery.  The patient tolerated the procedure well and is transported to the cath lab recovery area in stable condition. There were no immediate intraoperative complications. All sponge instrument and needle counts are verified correct at completion of the operation.   No blood products were administered during the operation.  The patient received a total of 45 mL of intravenous contrast during the procedure.   Gaye Pollack, MD 11/15/2019

## 2019-11-16 ENCOUNTER — Ambulatory Visit (INDEPENDENT_AMBULATORY_CARE_PROVIDER_SITE_OTHER): Payer: Medicare Other

## 2019-11-16 ENCOUNTER — Inpatient Hospital Stay (HOSPITAL_COMMUNITY): Payer: Medicare Other

## 2019-11-16 ENCOUNTER — Encounter: Payer: Self-pay | Admitting: *Deleted

## 2019-11-16 DIAGNOSIS — Z952 Presence of prosthetic heart valve: Secondary | ICD-10-CM

## 2019-11-16 DIAGNOSIS — I350 Nonrheumatic aortic (valve) stenosis: Secondary | ICD-10-CM

## 2019-11-16 DIAGNOSIS — I447 Left bundle-branch block, unspecified: Secondary | ICD-10-CM | POA: Diagnosis not present

## 2019-11-16 DIAGNOSIS — Z954 Presence of other heart-valve replacement: Secondary | ICD-10-CM

## 2019-11-16 LAB — POCT I-STAT, CHEM 8
BUN: 16 mg/dL (ref 8–23)
BUN: 16 mg/dL (ref 8–23)
BUN: 17 mg/dL (ref 8–23)
Calcium, Ion: 1.19 mmol/L (ref 1.15–1.40)
Calcium, Ion: 1.15 mmol/L (ref 1.15–1.40)
Calcium, Ion: 1.11 mmol/L — ABNORMAL LOW (ref 1.15–1.40)
Chloride: 107 mmol/L (ref 98–111)
Chloride: 106 mmol/L (ref 98–111)
Chloride: 108 mmol/L (ref 98–111)
Creatinine, Ser: 0.9 mg/dL (ref 0.61–1.24)
Creatinine, Ser: 1.1 mg/dL (ref 0.61–1.24)
Creatinine, Ser: 1 mg/dL (ref 0.61–1.24)
Glucose, Bld: 130 mg/dL — ABNORMAL HIGH (ref 70–99)
Glucose, Bld: 123 mg/dL — ABNORMAL HIGH (ref 70–99)
Glucose, Bld: 144 mg/dL — ABNORMAL HIGH (ref 70–99)
HCT: 42 % (ref 39.0–52.0)
HCT: 39 % (ref 39.0–52.0)
HCT: 38 % — ABNORMAL LOW (ref 39.0–52.0)
Hemoglobin: 14.3 g/dL (ref 13.0–17.0)
Hemoglobin: 13.3 g/dL (ref 13.0–17.0)
Hemoglobin: 12.9 g/dL — ABNORMAL LOW (ref 13.0–17.0)
Potassium: 3.7 mmol/L (ref 3.5–5.1)
Potassium: 4 mmol/L (ref 3.5–5.1)
Potassium: 4 mmol/L (ref 3.5–5.1)
Sodium: 140 mmol/L (ref 135–145)
Sodium: 139 mmol/L (ref 135–145)
Sodium: 139 mmol/L (ref 135–145)
TCO2: 26 mmol/L (ref 22–32)
TCO2: 24 mmol/L (ref 22–32)
TCO2: 23 mmol/L (ref 22–32)

## 2019-11-16 LAB — BASIC METABOLIC PANEL
Anion gap: 8 (ref 5–15)
BUN: 15 mg/dL (ref 8–23)
CO2: 23 mmol/L (ref 22–32)
Calcium: 8.3 mg/dL — ABNORMAL LOW (ref 8.9–10.3)
Chloride: 104 mmol/L (ref 98–111)
Creatinine, Ser: 1.32 mg/dL — ABNORMAL HIGH (ref 0.61–1.24)
GFR calc Af Amer: 56 mL/min — ABNORMAL LOW (ref >60)
GFR calc non Af Amer: 49 mL/min — ABNORMAL LOW (ref >60)
Glucose, Bld: 153 mg/dL — ABNORMAL HIGH (ref 70–99)
Potassium: 4.3 mmol/L (ref 3.5–5.1)
Sodium: 135 mmol/L (ref 135–145)

## 2019-11-16 LAB — GLUCOSE, CAPILLARY
Glucose-Capillary: 290 mg/dL — ABNORMAL HIGH (ref 70–99)
Glucose-Capillary: 148 mg/dL — ABNORMAL HIGH (ref 70–99)
Glucose-Capillary: 165 mg/dL — ABNORMAL HIGH (ref 70–99)

## 2019-11-16 LAB — CBC
HCT: 40.9 % (ref 39.0–52.0)
Hemoglobin: 13.3 g/dL (ref 13.0–17.0)
MCH: 30.8 pg (ref 26.0–34.0)
MCHC: 32.5 g/dL (ref 30.0–36.0)
MCV: 94.7 fL (ref 80.0–100.0)
Platelets: 116 10*3/uL — ABNORMAL LOW (ref 150–400)
RBC: 4.32 MIL/uL (ref 4.22–5.81)
RDW: 13.1 % (ref 11.5–15.5)
WBC: 7.7 10*3/uL (ref 4.0–10.5)
nRBC: 0 % (ref 0.0–0.2)

## 2019-11-16 LAB — ECHOCARDIOGRAM COMPLETE
Height: 68 in
Weight: 2742.4 oz

## 2019-11-16 LAB — MAGNESIUM: Magnesium: 1.9 mg/dL (ref 1.7–2.4)

## 2019-11-16 MED ORDER — CHLORHEXIDINE GLUCONATE CLOTH 2 % EX PADS: 6.0000 | MEDICATED_PAD | Freq: Every day | CUTANEOUS | Status: DC

## 2019-11-16 MED ORDER — ASPIRIN 81 MG PO CHEW: 81.0000 mg | CHEWABLE_TABLET | Freq: Every day | ORAL | Status: DC

## 2019-11-16 NOTE — Discharge Summary (Addendum)
[place] VALVE TEAM  Discharge Summary    Patient ID: Bernard Kim MRN: [id number]; DOB: 02-May-1931  Admit date: 11/15/2019 Discharge date: 11/16/2019  Primary Care Provider: Marco Collie, MD  Primary Cardiologist: Shirlee More, MD / Dr. Burt Knack & Dr. Cyndia Bent (TAVR)  Discharge Diagnoses    Principal Problem:   S/P TAVR (transcatheter aortic valve replacement) Active Problems:   Bladder cancer [facility])   Coronary artery disease   Diabetes mellitus, type 2 ([place])   Essential hypertension   History of prostate cancer   Hyperlipidemia   Hypertension   Paroxysmal A-fib (HCC)   CAD in native artery   CKD (chronic kidney disease) stage 3, GFR 30-59 ml/min   Severe aortic stenosis   Allergies Allergies  Allergen Reactions  . Spironolactone Rash    Diagnostic Studies/Procedures    TAVR OPERATIVE NOTE   Date of Procedure:                11/15/2019  Preoperative Diagnosis:      Severe Aortic Stenosis   Postoperative Diagnosis:    Same   Procedure:        Transcatheter Aortic Valve Replacement - Left Subclavian Artery Approach             Edwards Sapien 3 THV (size 29 mm, model #9600TFX, serial # [id number])              Co-Surgeons:                        Gaye Pollack, MD and Sherren Mocha, MD   Anesthesiologist:                  Annye Asa, MD  Echocardiographer:              Bertrum Sol, MD  Pre-operative Echo Findings: ? Severe aortic stenosis ? Normal left ventricular systolic function  Post-operative Echo Findings: ? mild paravalvular leak ? Normal left ventricular systolic function  _____________   Echo 11/16/19: completed but pending formal read at the time of discharge    History of Present Illness     Bernard Kim is a 86 y.o. male with a history of PAF on Eliquis, chronic diastolic CHF, CKD stage II, bladder cancer, HTN, CAD, and severe AS who presented to [facility] on 11/15/19 for planned TAVR.   He  was hospitalized in 2018 with atrial fibrillation with rapid ventricular response and acute heart failure.  He was noted to have an ejection fraction of 25 to 30% at that time with diffuse hypokinesis.  There was mild aortic stenosis with a mean gradient of 16 mmHg as well as moderate aortic insufficiency.  There was mild to moderate mitral regurgitation.  He underwent cardiac catheterization at that time which showed 40% left main stenosis, 60% LAD stenosis, insignificant disease in the left circumflex, and an 85% distal RCA stenosis.  He was noted to have elevated filling pressures with prominent V waves in his wedge tracing suggestive of mitral regurgitation.  His atrial fibrillation was converted with amiodarone and he has remained on amiodarone since.  Follow-up echocardiogram in 2019 showed his ejection fraction increased to 55%.  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 He reported worsening fatigue and weakness as well as DOE.His most recent echocardiogram and October 2020 showed an increase in the aortic  valve mean gradient to 30 mmHg with a peak gradient of 51 mmHg.  Aortic valve area was measured at 0.67 by VTI.  There was mild aortic insufficiency.  Left ventricular ejection fraction was 60 to 65% with moderate LVH and diastolic dysfunction.  There was mild mitral regurgitation.  LV stroke-volume was measured at 56 cc with an index of 29.  He underwent repeat catheterization on 10/19/2019 which showed heavily calcified coronary arteries which did not look much different compared to his prior catheterization in 2018.    The patient has been evaluated by the multidisciplinary valve team and felt to have severe, symptomatic aortic stenosis and to be a suitable candidate for TAVR, which was set up for 11/15/19.      Hospital Course     Consultants: none  Severe AS: s/p successful TAVR with a 29 mm Edwards Sapien 3 THV via the  left subclavavian approach on 11/15/19. Post operative echo completed but pending formal read. Groin sites are stable. ECG with new LBBB but no high grade heart block. Continue home Eliquis and started on a baby Asprin x 6 months.  New LBBB: QRS 166 ms. Will place a Zio patch prior to DC to watch for delayed HAVB.   HTN: BP has been elevated. His pre op labs showed creat up to 1.6. His lasix and losartan were held. Creat back to 1.32. Will restart home meds and follow kidney function as an outpatient. BMET next week.    DMT2: treated with SSI while admitted. Resume home regimen at discharge.   Incidental findings:pre TAVR CT showed indeterminate liver lesions. Follow-up evaluation with nonemergent abdominal MRI with and without IV gadolinium is recommended in the near future to better characterize these findings and exclude malignancy. Ectasia of ascending thoracic aorta (4.3 cm in diameter on CTA, 4.0 cm on Cardiac CT). Recommend annual imaging followup by CTA or MRA. This will be discussed in the outpatient setting.  _____________  Discharge Vitals Blood pressure 138/63, pulse 63, temperature 97.8 F (36.6 C), temperature source Oral, resp. rate 18, height 5\' 8"  (1.727 m), weight 77.7 kg, SpO2 96 %.  Filed Weights   11/15/19 0844 11/16/19 0300  Weight: 74.8 kg 77.7 kg    Labs & Radiologic Studies    CBC Recent Labs    11/15/19 1543 11/16/19 0510  WBC  --  7.7  HGB 13.9 13.3  HCT 41.0 40.9  MCV  --  94.7  PLT  --  [id number]*   Basic Metabolic Panel Recent Labs    11/15/19 1543 11/16/19 0510  NA 140 135  K 4.0 4.3  CL 107 104  CO2  --  23  GLUCOSE 146* 153*  BUN 17 15  CREATININE 1.00 1.32*  CALCIUM  --  8.3*  MG  --  1.9   Liver Function Tests No results for input(s): AST, ALT, ALKPHOS, BILITOT, PROT, ALBUMIN in the last 72 hours. No results for input(s): LIPASE, AMYLASE in the last 72 hours. Cardiac Enzymes No results for input(s): CKTOTAL, CKMB, CKMBINDEX, TROPONINI in  the last 72 hours. BNP Invalid input(s): POCBNP D-Dimer No results for input(s): DDIMER in the last 72 hours. Hemoglobin A1C No results for input(s): HGBA1C in the last 72 hours. Fasting Lipid Panel No results for input(s): CHOL, HDL, LDLCALC, TRIG, CHOLHDL, LDLDIRECT in the last 72 hours. Thyroid Function Tests No results for input(s): TSH, T4TOTAL, T3FREE, THYROIDAB in the last 72 hours.  Invalid input(s): FREET3 _____________  DG Chest 2 View  Result Date: 11/11/2019 CLINICAL DATA:  Preop aortic valve replacement EXAM: CHEST - 2 VIEW COMPARISON:  CT chest dated 11/02/2019 FINDINGS: Mild left basilar atelectasis. Right lung is clear. No pleural effusion or pneumothorax. The heart is normal in size. Mild thoracic aortic atherosclerosis. Degenerative changes of the visualized thoracolumbar spine. IMPRESSION: Normal chest radiographs. Electronically Signed   By: Julian Hy M.D.   On: 11/11/2019 10:47   CARDIAC CATHETERIZATION  Addendum Date: 10/19/2019    Mid LM to Dist LM lesion is 40% stenosed.  Prox LAD lesion is 60% stenosed.  Ost 1st Diag to 1st Diag lesion is 30% stenosed.  Prox Cx to Mid Cx lesion is 30% stenosed.  Prox RCA lesion is 40% stenosed.  Mid RCA lesion is 30% stenosed.  Dist RCA lesion is 60% stenosed.  RPAV lesion is 85% stenosed.  The left ventricular systolic function is normal.  LV end diastolic pressure is normal.  The left ventricular ejection fraction is 55-65% by visual estimate.  There is severe aortic valve stenosis.  1.  Heavily calcified coronary arteries with moderate left main stenosis, moderate proximal LAD stenosis and significant stenosis in the right posterior AV groove.  The coronary arteries do not look different from most recent catheterization in 2018. 2.  Normal LV systolic function. 3.  Right heart catheterization showed low filling pressures with RA pressure of 1 mmHg and pulmonary capillary wedge pressure of 5, normal pulmonary pressure  at 26/3 and normal cardiac output at 4.29 with a cardiac index of 2.27. 4.  Severe aortic stenosis with a mean gradient of 29.4 mmHg and valve area of 0.98. Recommendations: Continue evaluation for TAVR.  I do not think coronary revascularization is needed. Eliquis can be resumed tomorrow. I decreased the dose of furosemide to 40 mg once daily.   Result Date: 10/19/2019  Mid LM to Dist LM lesion is 40% stenosed.  Prox LAD lesion is 60% stenosed.  Ost 1st Diag to 1st Diag lesion is 30% stenosed.  Prox Cx to Mid Cx lesion is 30% stenosed.  Prox RCA lesion is 40% stenosed.  Mid RCA lesion is 30% stenosed.  Dist RCA lesion is 60% stenosed.  RPAV lesion is 85% stenosed.  The left ventricular systolic function is normal.  LV end diastolic pressure is normal.  The left ventricular ejection fraction is 55-65% by visual estimate.  There is severe aortic valve stenosis.  1.  Heavily calcified coronary arteries with moderate left main stenosis, moderate proximal LAD stenosis and significant stenosis in the right posterior AV groove.  The coronary arteries do not look different from most recent catheterization in 2018. 2.  Normal LV systolic function. 3.  Right heart catheterization showed low filling pressures with RA pressure of 1 mmHg and pulmonary capillary wedge pressure of 5, normal pulmonary pressure at 26/3 and normal cardiac output at 4.29 with a cardiac index of 2.27. 4.  Severe aortic stenosis with a mean gradient of 29.4 mmHg and valve area of 0.98. Recommendations: Continue evaluation for TAVR.  I do not think coronary revascularization is needed. Eliquis can be resumed tomorrow. I resumed the dose of furosemide to 40 mg once daily.   CT CORONARY MORPH W/CTA COR W/SCORE W/CA W/CM &/OR WO/CM  Addendum Date: 11/03/2019   ADDENDUM REPORT: 11/03/2019 21:44 CLINICAL DATA:  86 year old male with severe aortic stenosis being evaluated for a TAVR procedure. EXAM: Cardiac TAVR CT TECHNIQUE: The patient was  scanned on a [employer]. A 120 kV retrospective  scan was triggered in the descending thoracic aorta at 111 HU's. Gantry rotation speed was 250 msecs and collimation was .6 mm. No beta blockade or nitro were given. The 3D data set was reconstructed in 5% intervals of the R-R cycle. Systolic and diastolic phases were analyzed on a dedicated work station using MPR, MIP and VRT modes. The patient received 80 cc of contrast. FINDINGS: Aortic Valve: Trileaflet aortic valve with severely calcified leaflets and asymmetric calcifications extending into the LVOT under the non-coronary leaflet. Aorta: Upper normal size of the ascending aorta with maximum diameter 40 mm, mild to moderate diffuse atherosclerotic plaque and calcifications. No dissection. Sinotubular Junction: 36 x 34 mm Ascending Thoracic Aorta: 40 x 40 mm Aortic Arch: 27 x 27 mm Descending Thoracic Aorta: 27 x 25 mm Sinus of Valsalva Measurements: Non-coronary: 35 mm Right -coronary: 33 mm Left -coronary: 34 mm Coronary Artery Height above Annulus: Left Main: 19 mm Right Coronary: 22 mm Virtual Basal Annulus Measurements: Maximum/Minimum Diameter: 28.1 x 22.8 mm Mean Diameter: 25.6 mm Perimeter: 82.2 mm Area: 513 mm2 Optimum Fluoroscopic Angle for Delivery: LAO 17 CAU 13 IMPRESSION: 1. Trileaflet aortic valve with severely calcified leaflets and asymmetric calcifications extending into the LVOT under the non-coronary leaflet. Aortic valve calcium score is 3336 consistent with severe aortic stenosis. Annular measurements suitable for delivery of a 26 mm Edwards-SAPIEN 3 valve. 2. Sufficient coronary to annulus distance. 3. Optimum Fluoroscopic Angle for Delivery:   LAO 17 CAU 13. 4. No thrombus in the left atrial appendage. Electronically Signed   By: Ena Dawley   On: 11/03/2019 21:44   Result Date: 11/03/2019 EXAM: OVER-READ INTERPRETATION  CT CHEST The following report is an over-read performed by radiologist Dr. Vinnie Langton of  [facility] [place] on 11/02/2019. This over-read does not include interpretation of cardiac or coronary anatomy or pathology. The coronary calcium score/coronary CTA interpretation by the cardiologist is attached. COMPARISON:  Chest CTA 08/30/2017. FINDINGS: Extracardiac findings will be described separately under dictation for contemporaneously obtained CTA chest, abdomen and pelvis. IMPRESSION: Please see separate dictation for contemporaneously obtained CTA chest, abdomen and pelvis dated 11/02/2019 for full description of relevant extracardiac findings. Electronically Signed: By: Vinnie Langton M.D. On: 11/02/2019 10:32   DG Chest Port 1 View  Result Date: 11/15/2019 CLINICAL DATA:  And history of heart failure, status post central line placement and prior TAVR EXAM: PORTABLE CHEST 1 VIEW COMPARISON:  11/11/2019 FINDINGS: Cardiac shadow is stable. Changes are recent TAVR are seen. Right jugular central line is noted in the mid superior vena cava. No pneumothorax is noted. No focal infiltrate is seen. No bony abnormality is noted. IMPRESSION: No evidence of pneumothorax following central line placement. Changes consistent with recent TAVR. Electronically Signed   By: Inez Catalina M.D.   On: 11/15/2019 19:06   CT ANGIO CHEST AORTA W/CM & OR WO/CM  Result Date: 11/02/2019 CLINICAL DATA:  86 year old male with history of severe aortic stenosis. Preprocedural study prior to potential transcatheter aortic valve replacement (TAVR) procedure. EXAM: CT ANGIOGRAPHY CHEST, ABDOMEN AND PELVIS TECHNIQUE: Multidetector CT imaging through the chest, abdomen and pelvis was performed using the standard protocol during bolus administration of intravenous contrast. Multiplanar reconstructed images and MIPs were obtained and reviewed to evaluate the vascular anatomy. CONTRAST:  171mL OMNIPAQUE IOHEXOL 350 MG/ML SOLN COMPARISON:  Chest CTA 08/30/2017. FINDINGS: Comment: Today's study is suboptimal related to poor  contrast bolus (which may be related to reduced cardiac output). CTA CHEST FINDINGS Cardiovascular: Heart  size is mildly enlarged. There is no significant pericardial fluid, thickening or pericardial calcification. There is aortic atherosclerosis, as well as atherosclerosis of the great vessels of the mediastinum and the coronary arteries, including calcified atherosclerotic plaque in the left main, left anterior descending, left circumflex and right coronary arteries. Severe thickening calcification of the aortic valve, compatible with reported clinical history of severe aortic stenosis. Ectasia of the ascending thoracic aorta (4.3 cm in diameter). Mediastinum/Lymph Nodes: No pathologically enlarged mediastinal or hilar lymph nodes. Esophagus is unremarkable in appearance. No axillary lymphadenopathy. Lungs/Pleura: No acute consolidative airspace disease. No pleural effusions. No definite suspicious appearing pulmonary nodules or masses are noted. Musculoskeletal/Soft Tissues: There are no aggressive appearing lytic or blastic lesions noted in the visualized portions of the skeleton. CTA ABDOMEN AND PELVIS FINDINGS Hepatobiliary: 2 liver lesions poorly characterized on today's early arterial phase examination. The largest of these is in segment 4A of the liver (axial image 73 of series 11) measuring 5.0 x 4.3 cm which is centrally very low attenuation, but demonstrates a thick peripheral rim of intermediate to higher attenuation. No intra or extrahepatic biliary ductal dilatation. Gallbladder is normal in appearance. Pancreas: No pancreatic mass. No pancreatic ductal dilatation. No pancreatic or peripancreatic fluid collections or inflammatory changes. Spleen: Unremarkable. Adrenals/Urinary Tract: 1.8 cm low-attenuation lesion in the upper pole the left kidney, compatible with a simple cyst. Subcentimeter low-attenuation lesions noted elsewhere in both kidneys, too small to definitively characterize, but  statistically likely to represent tiny cysts. No suspicious renal lesions. Bilateral adrenal glands are normal in appearance. No hydroureteronephrosis. Urinary bladder is unremarkable in appearance. Stomach/Bowel: Normal appearance of the stomach. No pathologic dilatation of small bowel or colon. Numerous colonic diverticulae are noted, particularly in the sigmoid colon, without surrounding inflammatory changes to suggest an acute diverticulitis at this time. Normal appendix. Vascular/Lymphatic: Severe aortic atherosclerosis, with vascular findings and measurements pertinent to potential TAVR procedure, as detailed above. Complex atheromatous plaque in the distal infrarenal abdominal aorta with internal vascular webs and multiple channels best appreciated at axial image 131 of series 11, which could provide challenges for intravascular procedures. No lymphadenopathy noted in the abdomen or pelvis. Reproductive: Status post radical prostatectomy. Other: No significant volume of ascites.  No pneumoperitoneum. Musculoskeletal: There are no aggressive appearing lytic or blastic lesions noted in the visualized portions of the skeleton. VASCULAR MEASUREMENTS PERTINENT TO TAVR: AORTA: Minimal Aortic Diameter-1.2 x 1.5 mm Severity of Aortic Calcification-moderate RIGHT PELVIS: Right Common Iliac Artery - Minimal Diameter-5.4 x 4.4 mm Tortuosity-mild Calcification-moderate Right External Iliac Artery - Minimal Diameter-8.8 x 6.7 mm Tortuosity-moderate to severe Calcification-mild Right Common Femoral Artery - Minimal Diameter-7.3 x 7.2 mm Tortuosity-mild Calcification-mild LEFT PELVIS: Left Common Iliac Artery - Minimal Diameter-8.5 x 5.8 mm Tortuosity-mild Calcification-moderate Left External Iliac Artery - Minimal Diameter-9.1 x 7.2 mm Tortuosity-severe Calcification-mild Left Common Femoral Artery - Minimal Diameter-6.2 x 6.0 mm Tortuosity-mild Calcification-mild Review of the MIP images confirms the above findings.  IMPRESSION: 1. Vascular findings and measurements pertinent to potential TAVR procedure, as detailed above. In addition to those findings, please take note of the complex atheromatous plaque in the distal infrarenal abdominal aorta where there are multiple vascular webs which divide the aortic lumen into multiple channels. This may have implications for intravascular procedures. 2. Severe thickening and calcification of the aortic valve, compatible with the reported clinical history of severe aortic stenosis. 3. Indeterminate liver lesions. Follow-up evaluation with nonemergent abdominal MRI with and without IV gadolinium is recommended in the  near future to better characterize these findings and exclude malignancy. 4. Aortic atherosclerosis, in addition to left main and 3 vessel coronary artery disease. 5. 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 Recommend annual imaging followup by CTA or MRA. This recommendation follows 2010 ACCF/AHA/AATS/ACR/ASA/SCA/SCAI/SIR/STS/SVM Guidelines for the Diagnosis and Management of Patients with Thoracic Aortic Disease. Circulation. 2010; [id number]. Aortic aneurysm NOS (ICD10-I71.9). 6. Colonic diverticulosis without evidence of acute diverticulitis at this time. 7. Additional incidental findings, as above. Electronically Signed   By: Vinnie Langton M.D.   On: 11/02/2019 12:11   ECHO TEE  Result Date: 11/15/2019    TRANSESOPHOGEAL ECHO REPORT   Patient Name:   Bernard Kim Date of Exam: 11/15/2019 Medical Rec #:  [id number]    Height:       68.0 in Accession #:    [id number]   Weight:       165.0 lb Date of Birth:  1931-05-07    BSA:          1.883 m Patient Age:    75 years     BP:           127/77 mmHg Patient Gender: M            HR:           67 bpm. Exam Location:  Inpatient Procedure: Transesophageal Echo, Cardiac Doppler and Color Doppler Indications:     Aortic stenosis 424.1 / I35.0  History:         Patient has prior history of Echocardiogram  examinations, most                  recent 06/22/2019. CHF, CAD, TIA, Arrythmias:Atrial                  Fibrillation; Risk Factors:Hypertension.                  Aortic Valve: 29 mm Edwards Sapien prosthetic, stented (TAVR)                  valve is present in the aortic position. Procedure Date:                  11/15/2019.  Sonographer:     Darlina Sicilian RDCS Referring Phys:  [phone] MICHAEL COOPER Diagnosing Phys: Sanda Klein MD                   Preprocedure findings:                   Normal left ventricular systolic function. EF 45%.                  Inferior/inferolateral hypokinesis.                  Trileaflet aortic valve with severe (low-gradient, low-output)                  calcific aortic stenosis. Peak gradient 34 mm Hg, mean gradient                  24 mm Hg, dimensionless obstructive index 0.22, calculated                  aortic valve area 0.93 cm sq.                  There is mild aortic regurgitation.  There is mild mitral regurgitation.                  There is no pericardial effusion.                   Postprocedure findings:                   S/P successful deployment of a 29 mm Sapien 3 TAVR stent valve.                  Normal left ventricular systolic function. EF 45%. No change in                  regional wall motion.                  Trileaflet aortic valve with severe calcific aortic stenosis.                  Peak gradient 3 mm Hg, mean gradient 2 mm Hg, dimensionless                  obstructive index 0.88, calculated aortic valve area 3.99 cm                  sq.                  There is no intra-annular aortic regurgitation.                  After initial deployment there is 2+ posterior perivalvular                  leak. After baloon reexpansion, there is mild (1+) residual                  insufficiency in the same area, where there is a heavy burden                  of calcium.                  There is mild mitral regurgitation.                  There is no  pericardial effusion. IMPRESSIONS  1. There is a 29 mm Edwards Sapien prosthetic (TAVR) valve present in the aortic position. Procedure Date: 11/15/2019. FINDINGS  Aortic Valve: Aortic regurgitation PHT measures 451 msec. Aortic valve mean gradient measures 2.0 mmHg. Aortic valve peak gradient measures 3.4 mmHg. Aortic valve area, by VTI measures 3.99 cm. There is a 29 mm Edwards Sapien prosthetic, stented (TAVR)  valve present in the aortic position. Procedure Date: 11/15/2019.  LEFT VENTRICLE PLAX 2D LVOT diam:     2.32 cm LV SV:         80 LV SV Index:   42 LVOT Area:     4.23 cm  AORTIC VALVE AV Area (Vmax):    3.71 cm AV Area (Vmean):   3.74 cm AV Area (VTI):     3.99 cm AV Vmax:           92.50 cm/s AV Vmean:          58.000 cm/s AV VTI:            0.200 m AV Peak Grad:      3.4 mmHg AV Mean Grad:      2.0 mmHg LVOT Vmax:         81.10 cm/s LVOT  Vmean:        51.300 cm/s LVOT VTI:          0.189 m LVOT/AV VTI ratio: 0.94 AI PHT:            451 msec  SHUNTS Systemic VTI:  0.19 m Systemic Diam: 2.32 cm Sanda Klein MD Electronically signed by Sanda Klein MD Signature Date/Time: 11/15/2019/5:53:57 PM    Final    VAS US CAROTID  Result Date: 11/02/2019 Carotid Arterial Duplex Study Indications:       Pre tavr. Risk Factors:      Hypertension, coronary artery disease. Comparison Study:  no prior Performing Technologist: Abram Sander RVS  Examination Guidelines: A complete evaluation includes B-mode imaging, spectral Doppler, color Doppler, and power Doppler as needed of all accessible portions of each vessel. Bilateral testing is considered an integral part of a complete examination. Limited examinations for reoccurring indications may be performed as noted.  Right Carotid Findings: +----------+--------+--------+--------+-------------------------+--------+           PSV cm/sEDV cm/sStenosisPlaque Description       Comments +----------+--------+--------+--------+-------------------------+--------+  CCA Prox  80                      heterogenous                      +----------+--------+--------+--------+-------------------------+--------+ CCA Distal53      11              heterogenous                      +----------+--------+--------+--------+-------------------------+--------+ ICA Prox  100     22      1-39%   calcific and heterogenous         +----------+--------+--------+--------+-------------------------+--------+ ICA Distal81      22                                                +----------+--------+--------+--------+-------------------------+--------+ ECA       117     13                                                +----------+--------+--------+--------+-------------------------+--------+ +----------+--------+-------+--------+-------------------+           PSV cm/sEDV cmsDescribeArm Pressure (mmHG) +----------+--------+-------+--------+-------------------+ [id number]                                        +----------+--------+-------+--------+-------------------+ +---------+--------+--+--------+--+---------+ VertebralPSV cm/s38EDV cm/s11Antegrade +---------+--------+--+--------+--+---------+  Left Carotid Findings: +----------+--------+--------+--------+------------------+--------+           PSV cm/sEDV cm/sStenosisPlaque DescriptionComments +----------+--------+--------+--------+------------------+--------+ CCA Prox  74      11              heterogenous               +----------+--------+--------+--------+------------------+--------+ CCA Distal71      9               heterogenous               +----------+--------+--------+--------+------------------+--------+ ICA Prox  130     34  1-39%   heterogenous               +----------+--------+--------+--------+------------------+--------+ ICA Distal64      14                                          +----------+--------+--------+--------+------------------+--------+ ECA       97      11                                         +----------+--------+--------+--------+------------------+--------+ +----------+--------+--------+--------+-------------------+           PSV cm/sEDV cm/sDescribeArm Pressure (mmHG) +----------+--------+--------+--------+-------------------+ [id number]                                          +----------+--------+--------+--------+-------------------+ +---------+--------+--+--------+--+---------+ VertebralPSV cm/s60EDV cm/s13Antegrade +---------+--------+--+--------+--+---------+   Summary: Right Carotid: Velocities in the right ICA are consistent with a 1-39% stenosis. Left Carotid: Velocities in the left ICA are consistent with a 1-39% stenosis. Vertebrals: Bilateral vertebral arteries demonstrate antegrade flow. *See table(s) above for measurements and observations.  Electronically signed by Harold Barban MD on 11/02/2019 at 6:38:30 PM.    Final    Structural Heart Procedure  Result Date: 11/15/2019 See surgical note for result.  CT Angio Abd/Pel w/ and/or w/o  Result Date: 11/02/2019 CLINICAL DATA:  86 year old male with history of severe aortic stenosis. Preprocedural study prior to potential transcatheter aortic valve replacement (TAVR) procedure. EXAM: CT ANGIOGRAPHY CHEST, ABDOMEN AND PELVIS TECHNIQUE: Multidetector CT imaging through the chest, abdomen and pelvis was performed using the standard protocol during bolus administration of intravenous contrast. Multiplanar reconstructed images and MIPs were obtained and reviewed to evaluate the vascular anatomy. CONTRAST:  18mL OMNIPAQUE IOHEXOL 350 MG/ML SOLN COMPARISON:  Chest CTA 08/30/2017. FINDINGS: Comment: Today's study is suboptimal related to poor contrast bolus (which may be related to reduced cardiac output). CTA CHEST FINDINGS Cardiovascular: Heart size is mildly enlarged. There is no  significant pericardial fluid, thickening or pericardial calcification. There is aortic atherosclerosis, as well as atherosclerosis of the great vessels of the mediastinum and the coronary arteries, including calcified atherosclerotic plaque in the left main, left anterior descending, left circumflex and right coronary arteries. Severe thickening calcification of the aortic valve, compatible with reported clinical history of severe aortic stenosis. Ectasia of the ascending thoracic aorta (4.3 cm in diameter). Mediastinum/Lymph Nodes: No pathologically enlarged mediastinal or hilar lymph nodes. Esophagus is unremarkable in appearance. No axillary lymphadenopathy. Lungs/Pleura: No acute consolidative airspace disease. No pleural effusions. No definite suspicious appearing pulmonary nodules or masses are noted. Musculoskeletal/Soft Tissues: There are no aggressive appearing lytic or blastic lesions noted in the visualized portions of the skeleton. CTA ABDOMEN AND PELVIS FINDINGS Hepatobiliary: 2 liver lesions poorly characterized on today's early arterial phase examination. The largest of these is in segment 4A of the liver (axial image 73 of series 11) measuring 5.0 x 4.3 cm which is centrally very low attenuation, but demonstrates a thick peripheral rim of intermediate to higher attenuation. No intra or extrahepatic biliary ductal dilatation. Gallbladder is normal in appearance. Pancreas: No pancreatic mass. No pancreatic ductal dilatation. No pancreatic or peripancreatic fluid collections or inflammatory changes. Spleen: Unremarkable. Adrenals/Urinary Tract: 1.8 cm  low-attenuation lesion in the upper pole the left kidney, compatible with a simple cyst. Subcentimeter low-attenuation lesions noted elsewhere in both kidneys, too small to definitively characterize, but statistically likely to represent tiny cysts. No suspicious renal lesions. Bilateral adrenal glands are normal in appearance. No hydroureteronephrosis.  Urinary bladder is unremarkable in appearance. Stomach/Bowel: Normal appearance of the stomach. No pathologic dilatation of small bowel or colon. Numerous colonic diverticulae are noted, particularly in the sigmoid colon, without surrounding inflammatory changes to suggest an acute diverticulitis at this time. Normal appendix. Vascular/Lymphatic: Severe aortic atherosclerosis, with vascular findings and measurements pertinent to potential TAVR procedure, as detailed above. Complex atheromatous plaque in the distal infrarenal abdominal aorta with internal vascular webs and multiple channels best appreciated at axial image 131 of series 11, which could provide challenges for intravascular procedures. No lymphadenopathy noted in the abdomen or pelvis. Reproductive: Status post radical prostatectomy. Other: No significant volume of ascites.  No pneumoperitoneum. Musculoskeletal: There are no aggressive appearing lytic or blastic lesions noted in the visualized portions of the skeleton. VASCULAR MEASUREMENTS PERTINENT TO TAVR: AORTA: Minimal Aortic Diameter-1.2 x 1.5 mm Severity of Aortic Calcification-moderate RIGHT PELVIS: Right Common Iliac Artery - Minimal Diameter-5.4 x 4.4 mm Tortuosity-mild Calcification-moderate Right External Iliac Artery - Minimal Diameter-8.8 x 6.7 mm Tortuosity-moderate to severe Calcification-mild Right Common Femoral Artery - Minimal Diameter-7.3 x 7.2 mm Tortuosity-mild Calcification-mild LEFT PELVIS: Left Common Iliac Artery - Minimal Diameter-8.5 x 5.8 mm Tortuosity-mild Calcification-moderate Left External Iliac Artery - Minimal Diameter-9.1 x 7.2 mm Tortuosity-severe Calcification-mild Left Common Femoral Artery - Minimal Diameter-6.2 x 6.0 mm Tortuosity-mild Calcification-mild Review of the MIP images confirms the above findings. IMPRESSION: 1. Vascular findings and measurements pertinent to potential TAVR procedure, as detailed above. In addition to those findings, please take note  of the complex atheromatous plaque in the distal infrarenal abdominal aorta where there are multiple vascular webs which divide the aortic lumen into multiple channels. This may have implications for intravascular procedures. 2. Severe thickening and calcification of the aortic valve, compatible with the reported clinical history of severe aortic stenosis. 3. Indeterminate liver lesions. Follow-up evaluation with nonemergent abdominal MRI with and without IV gadolinium is recommended in the near future to better characterize these findings and exclude malignancy. 4. Aortic atherosclerosis, in addition to left main and 3 vessel coronary artery disease. 5. 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 Recommend annual imaging followup by CTA or MRA. This recommendation follows 2010 ACCF/AHA/AATS/ACR/ASA/SCA/SCAI/SIR/STS/SVM Guidelines for the Diagnosis and Management of Patients with Thoracic Aortic Disease. Circulation. 2010; 121ML:4928372. Aortic aneurysm NOS (ICD10-I71.9). 6. Colonic diverticulosis without evidence of acute diverticulitis at this time. 7. Additional incidental findings, as above. Electronically Signed   By: Vinnie Langton M.D.   On: 11/02/2019 12:11   Disposition   Pt is being discharged home today in good condition.  Follow-up Plans & Appointments    Follow-up Information    Eileen Stanford, PA-C. Go on 11/23/2019.   Specialties: Cardiology, Radiology Why: @1 :30pm, please arrive at least 10 minutes early Contact information: [address] [id number] [phone]            Discharge Medications   Allergies as of 11/16/2019      Reactions   Spironolactone Rash      Medication List    TAKE these medications   amiodarone 200 MG tablet Commonly known as: PACERONE Take 1 tablet (200 mg total) by mouth daily.   apixaban 2.5  MG Tabs tablet Commonly known as: Eliquis Take 1 tablet (2.5 mg total) by mouth 2 (two) times  daily.   aspirin 81 MG chewable tablet Chew 1 tablet (81 mg total) by mouth daily. Start taking on: November 17, 2019   [facility] Inject 20 Units into the skin at bedtime.   empagliflozin 25 MG Tabs tablet Commonly known as: JARDIANCE Take 25 mg by mouth daily.   ezetimibe 10 MG tablet Commonly known as: ZETIA Take 1 tablet (10 mg total) by mouth daily.   furosemide 80 MG tablet Commonly known as: LASIX TAKE 1 TABLET ONCE DAILY.   losartan 25 MG tablet Commonly known as: COZAAR TAKE 0.5 TABLET BY MOUTH DAILY What changed: See the new instructions.   lovastatin 40 MG tablet Commonly known as: MEVACOR Take 40 mg by mouth at bedtime.   nitroGLYCERIN 0.4 MG SL tablet Commonly known as: Nitrostat DISSOLVE 1 TABLET UNDER THE TONGUE EVERY 5 MINUTES AS NEEDED FOR CHEST PAIN. What changed:   how much to take  how to take this  when to take this  reasons to take this  additional instructions   triamcinolone cream 0.1 % Commonly known as: KENALOG Apply 1 application topically 2 (two) times daily as needed (itching).   Tylenol 8 Hour Arthritis Pain 650 MG CR tablet Generic drug: acetaminophen Take 650 mg by mouth at bedtime.   acetaminophen 500 MG tablet Commonly known as: TYLENOL Take 500 mg by mouth every 6 (six) hours as needed for mild pain (out of arthritis).           Outstanding Labs/Studies   BMET  Duration of Discharge Encounter   Greater than 30 minutes including physician time.  SignedAngelena Form, PA-C 11/16/2019, 10:18 AM [phone]

## 2019-11-16 NOTE — Progress Notes (Signed)
1 Day Post-Op Procedure(s) (LRB): TRANSCATHETER AORTIC VALVE REPLACEMENT, LEFT SUBCLAVIAN APPROACH (Left) TRANSESOPHAGEAL ECHOCARDIOGRAM (TEE) (N/A) Subjective:  No complaints. Daughter stayed overnight with him. He has ambulated well and eating breakfast well.  Objective: Vital signs in last 24 hours: Temp:  [97.5 F (36.4 C)-98.7 F (37.1 C)] 97.8 F (36.6 C) (03/03 0750) Pulse Rate:  [59-94] 63 (03/03 0750) Cardiac Rhythm: Normal sinus rhythm;Bundle branch block (03/03 0703) Resp:  [14-29] 18 (03/03 0750) BP: (120-195)/(44-84) 138/63 (03/03 0750) SpO2:  [91 %-98 %] 96 % (03/03 0750) Weight:  [74.8 kg-77.7 kg] 77.7 kg (03/03 0300)  Hemodynamic parameters for last 24 hours:    Intake/Output from previous day: 03/02 0701 - 03/03 0700 In: 2954.3 [P.O.:840; I.V.:1714.3; IV Piggyback:400] Out: 1550 [Urine:1500; Blood:50] Intake/Output this shift: No intake/output data recorded.  General appearance: alert and cooperative Neurologic: intact Heart: regular rate and rhythm, S1, S2 normal, no murmur Lungs: clear to auscultation bilaterally Extremities: extremities normal, groin site ok Wound: incision looks good.  Lab Results: Recent Labs    11/15/19 1543 11/16/19 0510  WBC  --  7.7  HGB 13.9 13.3  HCT 41.0 40.9  PLT  --  116*   BMET:  Recent Labs    11/15/19 1543 11/16/19 0510  NA 140 135  K 4.0 4.3  CL 107 104  CO2  --  23  GLUCOSE 146* 153*  BUN 17 15  CREATININE 1.00 1.32*  CALCIUM  --  8.3*    PT/INR:  Recent Labs    11/15/19 0840  LABPROT 12.9  INR 1.0   ABG    Component Value Date/Time   PHART 7.410 11/11/2019 1054   HCO3 25.1 11/11/2019 1054   TCO2 21 (L) 11/15/2019 1543   ACIDBASEDEF 7.0 (H) 08/31/2017 1900   O2SAT 96.7 11/11/2019 1054   CBG (last 3)  Recent Labs    11/15/19 1858 11/15/19 2113 11/16/19 0624  GLUCAP 130* 290* 148*   ECG: new LBBB postop. QRS 166 msec today. Was 172 msec postop.   Assessment/Plan: S/P  Procedure(s) (LRB): TRANSCATHETER AORTIC VALVE REPLACEMENT, LEFT SUBCLAVIAN APPROACH (Left) TRANSESOPHAGEAL ECHOCARDIOGRAM (TEE) (N/A)  POD 1 Hemodynamically stable in sinus rhythm.  New postop LBBB with wide QRS. He had syncope preop. Will plan to place ZIO patch for two weeks. He is on amio 200 mg daily for atrial fib. Will continue this for now and observe rhythm.  2D echo this am.  DC central line  Resume Eliquis for AF at discharge. ASA 81 mg.  DM: glucose high last pm after sugary meal but ok this am. Resume Lantus and Jardiance at home.  Plan discharge today with daughter.     LOS: 1 day    Gaye Pollack 11/16/2019

## 2019-11-16 NOTE — Progress Notes (Signed)
D/C instructions given to pt. Medications, wound care, and zio patch reviewed. All questions answered. IV removed, clean and intact. Daughter to escort pt home.   Clyde Canterbury, RN

## 2019-11-16 NOTE — Progress Notes (Signed)
Spoke with RN Orvil Feil, and she is aware of the DC central line order for the patient

## 2019-11-16 NOTE — Progress Notes (Signed)
  Echocardiogram 2D Echocardiogram has been performed.  Bernard Kim 11/16/2019, 10:31 AM

## 2019-11-16 NOTE — Progress Notes (Signed)
CARDIAC REHAB PHASE I   PRE:  Rate/Rhythm: 62 SR    BP: sitting 146/68    SaO2: 95 RA  MODE:  Ambulation: 200 ft   POST:  Rate/Rhythm: 78 Sr    BP: sitting 157/61     SaO2: 95 RA  Pt somewhat stiff, has ankle arthritis. [place] with distance, no AD, min assist. Sts he is better with his shoes. He has a cane at home if he needs it. To recliner, no c/o. Discussed restrictions, walking daily, and CRPII. Not interested in CRPII. Daughter present. [place], ACSM 11/16/2019 11:32 AM

## 2019-11-16 NOTE — Progress Notes (Signed)
Zio patch placed onto patient.  All instructions and information reviewed with patient, they verbalize understanding with no questions. 

## 2019-11-17 ENCOUNTER — Telehealth: Payer: Self-pay | Admitting: Physician Assistant

## 2019-11-17 DIAGNOSIS — I447 Left bundle-branch block, unspecified: Secondary | ICD-10-CM | POA: Diagnosis not present

## 2019-11-17 NOTE — Telephone Encounter (Signed)
  [place] VALVE TEAM   Patient contacted regarding discharge from [facility] on 11/16/2019  Patient understands to follow up with provider Nell Range on 3/10 at [facility].  Patient understands discharge instructions? yes Patient understands medications and regimen? yes Patient understands to bring all medications to this visit? Yes  Patient is not doing very well. Very lethargic. No pain. He has been up walking around. Daughter just a little concerned he was discharged from the hospital too early. Likely just recovering from anesthesia. Will check in with them tomorrow.   Angelena Form PA-C  [facility]

## 2019-11-18 ENCOUNTER — Telehealth: Payer: Self-pay | Admitting: Physician Assistant

## 2019-11-18 NOTE — Telephone Encounter (Signed)
  [facility]   MULTIDISCIPLINARY HEART VALVE TEAM  Pt still lethargic and unsteady. Been sleeping a lot. No AMS or profound weakness. Went over ER precautions. I will see him in the clinic early next week.  Angelena Form PA-C  [facility]

## 2019-11-21 ENCOUNTER — Ambulatory Visit: Payer: Medicare Other | Admitting: Physician Assistant

## 2019-11-21 ENCOUNTER — Other Ambulatory Visit: Payer: Self-pay

## 2019-11-21 VITALS — BP 130/72 | HR 76 | Wt 164.4 lb

## 2019-11-21 DIAGNOSIS — E1169 Type 2 diabetes mellitus with other specified complication: Secondary | ICD-10-CM | POA: Diagnosis not present

## 2019-11-21 DIAGNOSIS — I10 Essential (primary) hypertension: Secondary | ICD-10-CM

## 2019-11-21 DIAGNOSIS — I447 Left bundle-branch block, unspecified: Secondary | ICD-10-CM | POA: Diagnosis not present

## 2019-11-21 DIAGNOSIS — R0602 Shortness of breath: Secondary | ICD-10-CM | POA: Diagnosis not present

## 2019-11-21 DIAGNOSIS — Z9189 Other specified personal risk factors, not elsewhere classified: Secondary | ICD-10-CM

## 2019-11-21 DIAGNOSIS — Z952 Presence of prosthetic heart valve: Secondary | ICD-10-CM | POA: Diagnosis not present

## 2019-11-21 DIAGNOSIS — N1832 Chronic kidney disease, stage 3b: Secondary | ICD-10-CM

## 2019-11-21 DIAGNOSIS — Z794 Long term (current) use of insulin: Secondary | ICD-10-CM

## 2019-11-21 DIAGNOSIS — K769 Liver disease, unspecified: Secondary | ICD-10-CM

## 2019-11-21 NOTE — Patient Instructions (Signed)
Medication Instructions:  Your provider recommends that you continue on your current medications as directed. Please refer to the Current Medication list given to you today.    Labwork: TODAY: BMET, CBC, TSH, BNP  Follow-Up: Please keep your appointments as scheduled. We will call you with lab results.

## 2019-11-21 NOTE — Progress Notes (Addendum)
[facility]                                       Cardiology Office Note    Date:  11/21/2019   ID:  Bernard Kim, DOB 1931/04/18, MRN [id number]  PCP:  Marco Collie, MD  Cardiologist:  Shirlee More, MD / Dr. Burt Knack & Dr. Cyndia Bent (TAVR)  CC: weakness and fatigue.   History of Present Illness:  Bernard Kim is a 86 y.o. male with a history of PAF on Eliquis, chronic diastolic CHF, CKD stage II, bladder cancer, HTN, CAD, and severe AS s/p TAVR (11/15/19) who was added on to clinic today for evaluation of ongoing weakness and fatigue.   He was hospitalized in 2018 with atrial fibrillation with rapid ventricular response and acute heart failure. He was noted to have an ejection fraction of 25 to 30% at that time with diffuse hypokinesis. There was mild aortic stenosis with a mean gradient of 16 mmHg as well as moderate aortic insufficiency. There was mild to moderate mitral regurgitation. He underwent cardiac catheterization at that time which showed 40% left main stenosis, 60% LAD stenosis, insignificant disease in the left circumflex, and an 85% distal RCA stenosis. He was noted to have elevated filling pressures with prominent V waves in his wedge tracing suggestive of mitral regurgitation. His atrial fibrillation was converted with amiodarone and he has remained on amiodarone since. 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 He reported worsening fatigue and weakness as well as DOE.His most recent echocardiogram and October 2020 showed an increase in the aortic valve mean gradient to 30 mmHg with a peak gradient of 51 mmHg. Aortic valve area was measured at 0.67 by VTI. There was mild aortic insufficiency. Left ventricular ejection fraction was 60 to 65% with moderate LVH and diastolic  dysfunction.There was mild mitral regurgitation. LV stroke-volume was measured at 56 cc with an index of 29. He underwent repeat catheterization on 10/19/2019 which showed heavily calcified coronary arteries which did not look much different compared to his prior catheterization in 2018.   He was evaluated by the multidisciplinary valve team and underwent successful TAVR with a 29 mm Edwards Sapien 3 THV via the left subclavavian approach on 11/15/19. Post operative echo showed EF decreased to 40-45%, normally functioning TAVR with a mean gradient of 5 mm Hg and mild PVL. He was noted to have a new LBBB and Zio patch was placed. Of note, his pre op labs showed creat up to 1.6. His lasix and losartan were held. Creat returend back to 1.32 at discharged and his home lasix/losartan were resumed. He was discharged on home Eliquis with the addition of a baby aspirin.   The patient's family has called in several times with ongoing weakness and fatigue and requested to be seen in the office as soon as possible. He presents to clinic today with his daughter [name]. He has not doing well since TAVR. Has has worsening of fatigue and weakness. He has had a couple dizzy spells. No syncope. No chest pain.  No LE edema, orthopnea or PND. No blood in stool or urine. No palpitations. He does have some shortness of breath but mostly limited by extreme  weakness.    Past Medical History:  Diagnosis Date  . Cancer ([place])    bladdre cancer  . Chronic combined systolic (congestive) and diastolic (congestive) heart failure ([place])   . Coronary artery disease   . Diabetes mellitus without complication ([place])   . History of bladder cancer   . History of kidney stones   . History of TIA (transient ischemic attack)   . Hypertension   . PAF (paroxysmal atrial fibrillation) ([facility])    on Eliquis  . S/P TAVR (transcatheter aortic valve replacement) 11/15/2019   s/p TAVR wiht a 29 mm Edwards Sapien 3 via the Left subclavian approach  with Dr. Argentina Ponder  . Severe aortic stenosis     Past Surgical History:  Procedure Laterality Date  . CARDIAC CATHETERIZATION    . HERNIA REPAIR    . JOINT REPLACEMENT    . PROSTATE SURGERY    . RIGHT/LEFT HEART CATH AND CORONARY ANGIOGRAPHY N/A 09/03/2017   Procedure: RIGHT/LEFT HEART CATH AND CORONARY ANGIOGRAPHY;  Surgeon: Jolaine Artist, MD;  Location: [facility];  Service: Cardiovascular;  Laterality: N/A;  . RIGHT/LEFT HEART CATH AND CORONARY ANGIOGRAPHY N/A 10/19/2019   Procedure: RIGHT/LEFT HEART CATH AND CORONARY ANGIOGRAPHY;  Surgeon: Wellington Hampshire, MD;  Location: [facility];  Service: Cardiovascular;  Laterality: N/A;  . TEE WITHOUT CARDIOVERSION N/A 11/15/2019   Procedure: TRANSESOPHAGEAL ECHOCARDIOGRAM (TEE);  Surgeon: Sherren Mocha, MD;  Location: [facility];  Service: Open Heart Surgery;  Laterality: N/A;    Current Medications: Outpatient Medications Prior to Visit  Medication Sig Dispense Refill  . acetaminophen (TYLENOL 8 HOUR ARTHRITIS PAIN) 650 MG CR tablet Take 650 mg by mouth at bedtime.     Marland Kitchen acetaminophen (TYLENOL) 500 MG tablet Take 500 mg by mouth every 6 (six) hours as needed for mild pain (out of arthritis).    Marland Kitchen amiodarone (PACERONE) 200 MG tablet Take 1 tablet (200 mg total) by mouth daily. 90 tablet 1  . apixaban (ELIQUIS) 2.5 MG TABS tablet Take 1 tablet (2.5 mg total) by mouth 2 (two) times daily.    Marland Kitchen aspirin 81 MG chewable tablet Chew 1 tablet (81 mg total) by mouth daily.    . empagliflozin (JARDIANCE) 25 MG TABS tablet Take 25 mg by mouth daily.    Marland Kitchen ezetimibe (ZETIA) 10 MG tablet Take 1 tablet (10 mg total) by mouth daily. 30 tablet 3  . furosemide (LASIX) 80 MG tablet TAKE 1 TABLET ONCE DAILY. (Patient taking differently: Take 80 mg by mouth daily. ) 90 tablet 1  . Insulin Glargine (BASAGLAR KWIKPEN [place]) Inject 20 Units into the skin at bedtime.     Marland Kitchen losartan (COZAAR) 25 MG tablet TAKE 0.5 TABLET BY MOUTH DAILY (Patient taking  differently: Take 12.5 mg by mouth daily. ) 30 tablet 3  . lovastatin (MEVACOR) 40 MG tablet Take 40 mg by mouth at bedtime.     . nitroGLYCERIN (NITROSTAT) 0.4 MG SL tablet DISSOLVE 1 TABLET UNDER THE TONGUE EVERY 5 MINUTES AS NEEDED FOR CHEST PAIN. (Patient taking differently: Place 0.4 mg under the tongue every 5 (five) minutes as needed for chest pain. ) 25 tablet 3  . triamcinolone cream (KENALOG) 0.1 % Apply 1 application topically 2 (two) times daily as needed (itching).      No facility-administered medications prior to visit.     Allergies:   Spironolactone   Social History   Socioeconomic History  . Marital status: Married    Spouse  name: Not on file  . Number of children: Not on file  . Years of education: Not on file  . Highest education level: Not on file  Occupational History  . Not on file  Tobacco Use  . Smoking status: Never Smoker  . Smokeless tobacco: Never Used  Substance and Sexual Activity  . Alcohol use: No  . Drug use: No  . Sexual activity: Not on file  Other Topics Concern  . Not on file  Social History Narrative  . Not on file   Social Determinants of Health   Financial Resource Strain: Low Risk   . Difficulty of Paying Living Expenses: Not hard at all  Food Insecurity: No Food Insecurity  . Worried About Charity fundraiser in the Last Year: Never true  . Ran Out of Food in the Last Year: Never true  Transportation Needs: No Transportation Needs  . Lack of Transportation (Medical): No  . Lack of Transportation (Non-Medical): No  Physical Activity: Inactive  . Days of Exercise per Week: 0 days  . Minutes of Exercise per Session: 0 min  Stress: No Stress Concern Present  . Feeling of Stress : Not at all  Social Connections: Slightly Isolated  . Frequency of Communication with Friends and Family: More than three times a week  . Frequency of Social Gatherings with Friends and Family: Never  . Attends Religious Services: More than 4 times per  year  . Active Member of Clubs or Organizations: No  . Attends Archivist Meetings: Never  . Marital Status: Married     Family History:  The patient's family history includes Heart Problems in his brother; Heart attack in his father.     ROS:   Please see the history of present illness.    ROS All other systems reviewed and are negative.   PHYSICAL EXAM:   VS:  BP 130/72   Pulse 76   Wt 164 lb 6.4 oz (74.6 kg)   BMI 25.00 kg/m    GEN: Well nourished, well developed, in no acute distress HEENT: normal Neck: no JVD or masses Cardiac: RRR; no murmurs, rubs, or gallops,no edema  Respiratory:  clear to auscultation bilaterally, normal work of breathing GI: soft, nontender, nondistended, + BS MS: no deformity or atrophy Skin: warm and dry, no rash Neuro:  Alert and Oriented x 3, Strength and sensation are intact Psych: euthymic mood, full affect   Wt Readings from Last 3 Encounters:  11/21/19 164 lb 6.4 oz (74.6 kg)  11/16/19 171 lb 6.4 oz (77.7 kg)  11/11/19 165 lb (74.8 kg)      Studies/Labs Reviewed:   EKG:  EKG is ordered today.  The ekg ordered today demonstrates sinus w/ long 1st deg AV block (231ms) and wide LBBB (QRS 162 ms), HR 76 bpm  Recent Labs: 06/20/2019: TSH 1.570 11/11/2019: ALT 24; B Natriuretic Peptide 91.0 11/16/2019: BUN 15; Creatinine, Ser 1.32; Hemoglobin 13.3; Magnesium 1.9; Platelets 116; Potassium 4.3; Sodium 135   Lipid Panel    Component Value Date/Time   CHOL 193 06/20/2019 1247   TRIG 124 06/20/2019 1247   HDL 51 06/20/2019 1247   CHOLHDL 3.8 06/20/2019 1247   LDLCALC 120 (H) 06/20/2019 1247    Additional studies/ records that were reviewed today include:  TAVR OPERATIVE NOTE   Date of Procedure:11/15/2019  Preoperative Diagnosis:Severe Aortic Stenosis   Postoperative Diagnosis:Same   Procedure:   Transcatheter Aortic Valve Replacement -Left Subclavian  ArteryApproach Edwards Sapien 3  THV (size 12mm, model #9600TFX, serial [id number])  Co-Surgeons:Bryan Alveria Apley, MD and Sherren Mocha, MD   Anesthesiologist:Carswell Glennon Mac, MD  Echocardiographer:M. Croitoru, MD  Pre-operative Echo Findings: ? Severe aortic stenosis ? Normalleft ventricular systolic function  Post-operative Echo Findings: ? mildparavalvular leak ? Normalleft ventricular systolic function  _____________   Echo 11/16/19: IMPRESSIONS  1. Left ventricular ejection fraction, by estimation, is 40 to 45%. The  left ventricle has mildly decreased function. The left ventricle  demonstrates regional wall motion abnormalities. Left ventricular  diastolic parameters are consistent with Grade I  diastolic dysfunction (impaired relaxation). Elevated left atrial  pressure. There is mild hypokinesis of the left ventricular entire  inferolateral wall.  2. Right ventricular systolic function is normal. The right ventricular  size is normal. There is mildly elevated pulmonary artery systolic  pressure.  3. Left atrial size was moderately dilated.  4. Right atrial size was mildly dilated.  5. The mitral valve is normal in structure and function. Mild to moderate  mitral valve regurgitation.  6. The aortic valve has been repaired/replaced. There is a 29 mm Edwards  Sapien prosthetic (TAVR) valve present in the aortic position. Procedure  Date: 11/15/2019. Aortic valve area, by VTI measures 2.25 cm. Aortic  valve mean gradient measures 5.0 mmHg. Aortic valve Vmax measures 1.50 m/s. Dimensionless index 0.54.  Aortic valve regurgitation is mild (perivalvular).  7. The inferior vena cava is dilated in size with >50% respiratory  variability, suggesting right atrial pressure of 8 mmHg.     ASSESSMENT & PLAN:   Severe AS s/p TAVR: not doing well since TAVR. Has has worsening of  fatigue and weakness. He has had a couple dizzy spells. No syncope. Physical exam and vital signs are reassuring today. He does not have any evidence of volume overload on exam, but does have ongoing shortness of breath. Lung exam completely clear. ECG with no HAVB (see below). Will check CBC, BMET, TSH, BNP just to be complete. This could be the effects of anesthesia from surgery last week. Will continue to monitor closely.  Will order HHPT   New LBBB: ECG shows sinus w/ long 1st deg AV block (241ms) and wide LBBB (QRS 162 ms), HR 76 bpm. He is currently wearing a Zio patch. I reviewed Zio suite and have not seen any alerts.  Chronic systolic CHF: EF down to [id number] after TAVR.  ? New LBBB causing dysynchrony and worsening EF. Will follow on 30 day echo.   HTN: BP well controlled today    DMT2: he says blood glucose has been under good control   CKD stage IIIb: creat elevated prior to TAVR and diuretic/ARB held. Check BMET today   Incidental findings:pre TAVR CT showed    Indeterminate liver lesions. Follow-up evaluation with nonemergent abdominal MRI with and without IV gadolinium is recommended in the near future to better characterize these findings and exclude malignancy. Will discuss this at a later time.    Ectasia of ascending thoracic aorta (4.3 cm in diameter on CTA, 4.0 cm on Cardiac CT). Recommend annual imaging followup by CTA or MRA. This will be followed over time.     Medication Adjustments/Labs and Tests Ordered: Current medicines are reviewed at length with the patient today.  Concerns regarding medicines are outlined above.  Medication changes, Labs and Tests ordered today are listed in the Patient Instructions below. Patient Instructions  Medication Instructions:  Your provider recommends that you continue on your current medications as directed. Please refer to  the Current Medication list given to you today.    Labwork: TODAY: BMET, CBC, TSH,  BNP  Follow-Up: Please keep your appointments as scheduled. We will call you with lab results.     Mable Fill, PA-C  11/21/2019 12:09 PM    [facility] [address], [place], [place]  [address] Phone: [phone]; Fax: [phone]

## 2019-11-22 ENCOUNTER — Other Ambulatory Visit: Payer: Self-pay | Admitting: Physician Assistant

## 2019-11-22 DIAGNOSIS — N189 Chronic kidney disease, unspecified: Secondary | ICD-10-CM

## 2019-11-22 LAB — BASIC METABOLIC PANEL
BUN/Creatinine Ratio: 13 (ref 10–24)
BUN: 21 mg/dL (ref 8–27)
CO2: 26 mmol/L (ref 20–29)
Calcium: 9.1 mg/dL (ref 8.6–10.2)
Chloride: 97 mmol/L (ref 96–106)
Creatinine, Ser: 1.62 mg/dL — ABNORMAL HIGH (ref 0.76–1.27)
GFR calc Af Amer: 44 mL/min/{1.73_m2} — ABNORMAL LOW (ref >59)
GFR calc non Af Amer: 38 mL/min/{1.73_m2} — ABNORMAL LOW (ref >59)
Glucose: 179 mg/dL — ABNORMAL HIGH (ref 65–99)
Potassium: 4 mmol/L (ref 3.5–5.2)
Sodium: 141 mmol/L (ref 134–144)

## 2019-11-22 LAB — CBC WITH DIFFERENTIAL/PLATELET
Basophils Absolute: 0 10*3/uL (ref 0.0–0.2)
Basos: 0 %
EOS (ABSOLUTE): 0.2 10*3/uL (ref 0.0–0.4)
Eos: 2 %
Hematocrit: 48.8 % (ref 37.5–51.0)
Hemoglobin: 16.4 g/dL (ref 13.0–17.7)
Immature Grans (Abs): 0 10*3/uL (ref 0.0–0.1)
Immature Granulocytes: 1 %
Lymphocytes Absolute: 1.3 10*3/uL (ref 0.7–3.1)
Lymphs: 16 %
MCH: 31 pg (ref 26.6–33.0)
MCHC: 33.6 g/dL (ref 31.5–35.7)
MCV: 92 fL (ref 79–97)
Monocytes Absolute: 0.7 10*3/uL (ref 0.1–0.9)
Monocytes: 9 %
Neutrophils Absolute: 5.9 10*3/uL (ref 1.4–7.0)
Neutrophils: 72 %
Platelets: 170 10*3/uL (ref 150–450)
RBC: 5.29 x10E6/uL (ref 4.14–5.80)
RDW: 12.4 % (ref 11.6–15.4)
WBC: 8.1 10*3/uL (ref 3.4–10.8)

## 2019-11-22 LAB — TSH: TSH: 0.762 u[IU]/mL (ref 0.450–4.500)

## 2019-11-22 LAB — PRO B NATRIURETIC PEPTIDE: NT-Pro BNP: 700 pg/mL — ABNORMAL HIGH (ref 0–486)

## 2019-11-22 MED ORDER — FUROSEMIDE 80 MG PO TABS: 40.0000 mg | ORAL_TABLET | Freq: Every day | ORAL | 1 refills | Status: DC

## 2019-11-23 ENCOUNTER — Other Ambulatory Visit: Payer: Self-pay

## 2019-11-23 ENCOUNTER — Ambulatory Visit: Payer: Medicare Other | Admitting: Physician Assistant

## 2019-11-23 ENCOUNTER — Telehealth: Payer: Self-pay | Admitting: Cardiology

## 2019-11-23 ENCOUNTER — Other Ambulatory Visit: Payer: Self-pay | Admitting: *Deleted

## 2019-11-23 DIAGNOSIS — N189 Chronic kidney disease, unspecified: Secondary | ICD-10-CM

## 2019-11-23 DIAGNOSIS — R5383 Other fatigue: Secondary | ICD-10-CM

## 2019-11-23 DIAGNOSIS — R531 Weakness: Secondary | ICD-10-CM

## 2019-11-23 NOTE — Telephone Encounter (Signed)
Left message for Bernard Kim that she will be called in the AM.

## 2019-11-23 NOTE — Patient Outreach (Signed)
[facility]) Care Management  11/23/2019  Bernard Kim 1931/05/04 [id number]   RN Health Coach Post Procedure Outreach  Referral Date:11/01/2018 Referral Source:MD Office Reason for Referral:Disease Management Education Insurance:Blue Cross Blue Shield Medicare   Outreach Attempt:  Successful telephone outreach to patient's son for follow up post procedure.  HIPAA verified with son, Bernard Kim. (patient resting).  Patient underwent TAVR on 11/15/2019 with discharge home on 11/16/2019.  Son reports since discharge patient has been fatigued and not felt well.  States he is able to eat, take medication, and use bathroom, but does not have energy to do anything else.  Reports fasting blood sugar this morning was 106.  Family has been assisting patient since discharge and daughter has contacted provider, Dr. Bettina Gavia today for possible home health orders, still awaiting response.  Encouraged son to contact this [facility] if they have not gotten response concerning home health by tomorrow.  Appointments:  Attended post surgical appointment with Cardiology, PA [name] on 11/21/2019.  Plan: RN Health Coach will make another outreach within the month of March to follow up with patient and family.   [name] [phone] [name]

## 2019-11-23 NOTE — Telephone Encounter (Signed)
New Message:   Pt had heart surgery on 11-15-19. He is not doing that well. She was told to see if pt could get [facility] please.

## 2019-11-24 NOTE — Telephone Encounter (Signed)
Spoke with [name], the patient's daughter.  She requests Home Health for the patient since he is not doing well. Per Nell Range, OK to order. [name] understands she will be called to arrange sessions.

## 2019-11-28 DIAGNOSIS — Z952 Presence of prosthetic heart valve: Secondary | ICD-10-CM | POA: Diagnosis not present

## 2019-11-28 DIAGNOSIS — R0602 Shortness of breath: Secondary | ICD-10-CM | POA: Diagnosis not present

## 2019-11-29 LAB — CBC WITH DIFFERENTIAL/PLATELET
Basophils Absolute: 0.1 10*3/uL (ref 0.0–0.2)
Basos: 1 %
EOS (ABSOLUTE): 0.1 10*3/uL (ref 0.0–0.4)
Eos: 2 %
Hematocrit: 48.7 % (ref 37.5–51.0)
Hemoglobin: 15.8 g/dL (ref 13.0–17.7)
Immature Grans (Abs): 0.1 10*3/uL (ref 0.0–0.1)
Immature Granulocytes: 1 %
Lymphocytes Absolute: 1.2 10*3/uL (ref 0.7–3.1)
Lymphs: 13 %
MCH: 30.7 pg (ref 26.6–33.0)
MCHC: 32.4 g/dL (ref 31.5–35.7)
MCV: 95 fL (ref 79–97)
Monocytes Absolute: 0.5 10*3/uL (ref 0.1–0.9)
Monocytes: 6 %
Neutrophils Absolute: 7 10*3/uL (ref 1.4–7.0)
Neutrophils: 77 %
Platelets: 192 10*3/uL (ref 150–450)
RBC: 5.15 x10E6/uL (ref 4.14–5.80)
RDW: 12.1 % (ref 11.6–15.4)
WBC: 8.9 10*3/uL (ref 3.4–10.8)

## 2019-11-29 LAB — BASIC METABOLIC PANEL
BUN/Creatinine Ratio: 13 (ref 10–24)
BUN: 20 mg/dL (ref 8–27)
CO2: 28 mmol/L (ref 20–29)
Calcium: 9 mg/dL (ref 8.6–10.2)
Chloride: 96 mmol/L (ref 96–106)
Creatinine, Ser: 1.49 mg/dL — ABNORMAL HIGH (ref 0.76–1.27)
GFR calc Af Amer: 48 mL/min/{1.73_m2} — ABNORMAL LOW (ref >59)
GFR calc non Af Amer: 42 mL/min/{1.73_m2} — ABNORMAL LOW (ref >59)
Glucose: 361 mg/dL — ABNORMAL HIGH (ref 65–99)
Potassium: 4.6 mmol/L (ref 3.5–5.2)
Sodium: 139 mmol/L (ref 134–144)

## 2019-11-29 LAB — TSH: TSH: 1.19 u[IU]/mL (ref 0.450–4.500)

## 2019-11-29 LAB — PRO B NATRIURETIC PEPTIDE: NT-Pro BNP: 383 pg/mL (ref 0–486)

## 2019-11-29 NOTE — Telephone Encounter (Signed)
Confirmed with Tiffany at [facility] they are able to see the patient for [facility] sessions.

## 2019-12-01 ENCOUNTER — Other Ambulatory Visit: Payer: Self-pay | Admitting: *Deleted

## 2019-12-01 NOTE — Patient Outreach (Signed)
[facility]) Care Management  12/01/2019  NEBIYU GERHART 1931-05-09 [id number]   RN Health Coach Post Procedure Outreach  Referral Date:11/01/2018 Referral Source:MD Office Reason for Referral:Disease Management Education Insurance:Blue Cross [facility] Medicare   Outreach Attempt:  Outreach attempt #1 to patient for post procedural follow up. No answer. RN Health Coach left HIPAA compliant voicemail message along with contact information.  Plan:  RN Health Coach will make another outreach attempt within the month of April if no return call back from patient.  [place] [phone] [email or web address]

## 2019-12-07 DIAGNOSIS — Z7901 Long term (current) use of anticoagulants: Secondary | ICD-10-CM | POA: Diagnosis not present

## 2019-12-07 DIAGNOSIS — E1122 Type 2 diabetes mellitus with diabetic chronic kidney disease: Secondary | ICD-10-CM | POA: Diagnosis not present

## 2019-12-07 DIAGNOSIS — I130 Hypertensive heart and chronic kidney disease with heart failure and stage 1 through stage 4 chronic kidney disease, or unspecified chronic kidney disease: Secondary | ICD-10-CM | POA: Diagnosis not present

## 2019-12-07 DIAGNOSIS — I2510 Atherosclerotic heart disease of native coronary artery without angina pectoris: Secondary | ICD-10-CM | POA: Diagnosis not present

## 2019-12-07 DIAGNOSIS — Z954 Presence of other heart-valve replacement: Secondary | ICD-10-CM | POA: Diagnosis not present

## 2019-12-07 DIAGNOSIS — Z8673 Personal history of transient ischemic attack (TIA), and cerebral infarction without residual deficits: Secondary | ICD-10-CM | POA: Diagnosis not present

## 2019-12-07 DIAGNOSIS — Z9660 Presence of unspecified orthopedic joint implant: Secondary | ICD-10-CM | POA: Diagnosis not present

## 2019-12-07 DIAGNOSIS — I350 Nonrheumatic aortic (valve) stenosis: Secondary | ICD-10-CM | POA: Diagnosis not present

## 2019-12-07 DIAGNOSIS — I5042 Chronic combined systolic (congestive) and diastolic (congestive) heart failure: Secondary | ICD-10-CM | POA: Diagnosis not present

## 2019-12-07 DIAGNOSIS — Z7982 Long term (current) use of aspirin: Secondary | ICD-10-CM | POA: Diagnosis not present

## 2019-12-07 DIAGNOSIS — Z794 Long term (current) use of insulin: Secondary | ICD-10-CM | POA: Diagnosis not present

## 2019-12-07 DIAGNOSIS — I480 Paroxysmal atrial fibrillation: Secondary | ICD-10-CM | POA: Diagnosis not present

## 2019-12-07 DIAGNOSIS — Z48812 Encounter for surgical aftercare following surgery on the circulatory system: Secondary | ICD-10-CM | POA: Diagnosis not present

## 2019-12-07 DIAGNOSIS — N182 Chronic kidney disease, stage 2 (mild): Secondary | ICD-10-CM | POA: Diagnosis not present

## 2019-12-07 DIAGNOSIS — Z8551 Personal history of malignant neoplasm of bladder: Secondary | ICD-10-CM | POA: Diagnosis not present

## 2019-12-08 ENCOUNTER — Telehealth: Payer: Self-pay | Admitting: Cardiology

## 2019-12-08 NOTE — Telephone Encounter (Signed)
Place the orders, I think this is a carryover from his recent hospitalization Dr. Burt Knack with TAVR.

## 2019-12-08 NOTE — Telephone Encounter (Signed)
Mark calling from [facility] requesting orders for home health physical therapy orders 2 times a week for 6 weeks and 1 time a week for 2 weeks.

## 2019-12-09 DIAGNOSIS — Z7901 Long term (current) use of anticoagulants: Secondary | ICD-10-CM | POA: Diagnosis not present

## 2019-12-09 DIAGNOSIS — I350 Nonrheumatic aortic (valve) stenosis: Secondary | ICD-10-CM | POA: Diagnosis not present

## 2019-12-09 DIAGNOSIS — Z794 Long term (current) use of insulin: Secondary | ICD-10-CM | POA: Diagnosis not present

## 2019-12-09 DIAGNOSIS — Z8551 Personal history of malignant neoplasm of bladder: Secondary | ICD-10-CM | POA: Diagnosis not present

## 2019-12-09 DIAGNOSIS — Z7982 Long term (current) use of aspirin: Secondary | ICD-10-CM | POA: Diagnosis not present

## 2019-12-09 DIAGNOSIS — I5042 Chronic combined systolic (congestive) and diastolic (congestive) heart failure: Secondary | ICD-10-CM | POA: Diagnosis not present

## 2019-12-09 DIAGNOSIS — I2510 Atherosclerotic heart disease of native coronary artery without angina pectoris: Secondary | ICD-10-CM | POA: Diagnosis not present

## 2019-12-09 DIAGNOSIS — Z9660 Presence of unspecified orthopedic joint implant: Secondary | ICD-10-CM | POA: Diagnosis not present

## 2019-12-09 DIAGNOSIS — N182 Chronic kidney disease, stage 2 (mild): Secondary | ICD-10-CM | POA: Diagnosis not present

## 2019-12-09 DIAGNOSIS — Z954 Presence of other heart-valve replacement: Secondary | ICD-10-CM | POA: Diagnosis not present

## 2019-12-09 DIAGNOSIS — Z48812 Encounter for surgical aftercare following surgery on the circulatory system: Secondary | ICD-10-CM | POA: Diagnosis not present

## 2019-12-09 DIAGNOSIS — I130 Hypertensive heart and chronic kidney disease with heart failure and stage 1 through stage 4 chronic kidney disease, or unspecified chronic kidney disease: Secondary | ICD-10-CM | POA: Diagnosis not present

## 2019-12-09 DIAGNOSIS — E1122 Type 2 diabetes mellitus with diabetic chronic kidney disease: Secondary | ICD-10-CM | POA: Diagnosis not present

## 2019-12-09 DIAGNOSIS — Z8673 Personal history of transient ischemic attack (TIA), and cerebral infarction without residual deficits: Secondary | ICD-10-CM | POA: Diagnosis not present

## 2019-12-09 DIAGNOSIS — I480 Paroxysmal atrial fibrillation: Secondary | ICD-10-CM | POA: Diagnosis not present

## 2019-12-09 NOTE — Telephone Encounter (Signed)
I spoke with Elta Guadeloupe and per Nell Range PA-C/Dr Burt Knack order given for home health physical therapy 2 times a week for 6 weeks and 1 time a week for 2 weeks.

## 2019-12-13 DIAGNOSIS — Z7982 Long term (current) use of aspirin: Secondary | ICD-10-CM | POA: Diagnosis not present

## 2019-12-13 DIAGNOSIS — Z8673 Personal history of transient ischemic attack (TIA), and cerebral infarction without residual deficits: Secondary | ICD-10-CM | POA: Diagnosis not present

## 2019-12-13 DIAGNOSIS — N182 Chronic kidney disease, stage 2 (mild): Secondary | ICD-10-CM | POA: Diagnosis not present

## 2019-12-13 DIAGNOSIS — Z794 Long term (current) use of insulin: Secondary | ICD-10-CM | POA: Diagnosis not present

## 2019-12-13 DIAGNOSIS — Z9660 Presence of unspecified orthopedic joint implant: Secondary | ICD-10-CM | POA: Diagnosis not present

## 2019-12-13 DIAGNOSIS — I2510 Atherosclerotic heart disease of native coronary artery without angina pectoris: Secondary | ICD-10-CM | POA: Diagnosis not present

## 2019-12-13 DIAGNOSIS — Z7901 Long term (current) use of anticoagulants: Secondary | ICD-10-CM | POA: Diagnosis not present

## 2019-12-13 DIAGNOSIS — Z48812 Encounter for surgical aftercare following surgery on the circulatory system: Secondary | ICD-10-CM | POA: Diagnosis not present

## 2019-12-13 DIAGNOSIS — I5042 Chronic combined systolic (congestive) and diastolic (congestive) heart failure: Secondary | ICD-10-CM | POA: Diagnosis not present

## 2019-12-13 DIAGNOSIS — I130 Hypertensive heart and chronic kidney disease with heart failure and stage 1 through stage 4 chronic kidney disease, or unspecified chronic kidney disease: Secondary | ICD-10-CM | POA: Diagnosis not present

## 2019-12-13 DIAGNOSIS — E1122 Type 2 diabetes mellitus with diabetic chronic kidney disease: Secondary | ICD-10-CM | POA: Diagnosis not present

## 2019-12-13 DIAGNOSIS — Z8551 Personal history of malignant neoplasm of bladder: Secondary | ICD-10-CM | POA: Diagnosis not present

## 2019-12-13 DIAGNOSIS — I350 Nonrheumatic aortic (valve) stenosis: Secondary | ICD-10-CM | POA: Diagnosis not present

## 2019-12-13 DIAGNOSIS — Z954 Presence of other heart-valve replacement: Secondary | ICD-10-CM | POA: Diagnosis not present

## 2019-12-13 DIAGNOSIS — I480 Paroxysmal atrial fibrillation: Secondary | ICD-10-CM | POA: Diagnosis not present

## 2019-12-13 NOTE — Progress Notes (Signed)
[facility]                                       Cardiology Office Note    Date:  12/20/2019   ID:  Bernard Kim, DOB Sep 06, 1931, MRN [id number]  PCP:  Marco Collie, MD  Cardiologist:  Shirlee More, MD / Dr. Burt Knack & Dr. Cyndia Bent (TAVR)  CC: 1 month s/p TAVR  History of Present Illness:  Bernard Kim is a 86 y.o. male with a history of PAF on Eliquis, chronic diastolic CHF, CKD stage II, bladder cancer, HTN, CAD, and severe AS s/p TAVR (11/15/19) who presents to clinic for follow up.   He was hospitalized in 2018 with atrial fibrillation with rapid ventricular response and acute heart failure. He was noted to have an ejection fraction of 25 to 30% at that time with diffuse hypokinesis. There was mild aortic stenosis with a mean gradient of 16 mmHg as well as moderate aortic insufficiency. There was mild to moderate mitral regurgitation. He underwent cardiac catheterization at that time which showed 40% left main stenosis, 60% LAD stenosis, insignificant disease in the left circumflex, and an 85% distal RCA stenosis. He was noted to have elevated filling pressures with prominent V waves in his wedge tracing suggestive of mitral regurgitation. His atrial fibrillation was converted with amiodarone and he has remained on amiodarone since. 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 He reported worsening fatigue and weakness as well as DOE.His most recent echocardiogram and October 2020 showed an increase in the aortic valve mean gradient to 30 mmHg with a peak gradient of 51 mmHg. Aortic valve area was measured at 0.67 by VTI. There was mild aortic insufficiency. Left ventricular ejection fraction was 60 to 65% with moderate LVH and diastolic dysfunction.There was mild mitral regurgitation. LV  stroke-volume was measured at 56 cc with an index of 29. He underwent repeat catheterization on 10/19/2019 which showed heavily calcified coronary arteries which did not look much different compared to his prior catheterization in 2018.   He was evaluated by the multidisciplinary valve team and underwent successful TAVR with a 29 mm Edwards Sapien 3 THV via the left subclavavian approach on 11/15/19. Post operative echo showed EF decreased to 40-45%, normally functioning TAVR with a mean gradient of 5 mm Hg and mild PVL. He was noted to have a new LBBB and Zio patch was placed. Of note, his pre op labs showed creat up to 1.6. His lasix and losartan were held. Creat returend back to 1.32 at discharged and his home lasix/losartan were resumed. He was discharged on home Eliquis with the addition of a baby aspirin.   The patient's family called in several times after DC with ongoing weakness and fatigue and requested to be seen in the office as soon as possible. I saw him in the office on 11/21/19 and he reported worsening of fatigue and weakness with a couple a dizzy spells. His BNP and creat were a little elevated. I asked him to take an extra lasix and hold losartan x 2 days. Repeat labs showed improvement in BNP and renal function. Zio final report showed 1st deg and 2nd deg type I AV block. There was  no HAVB.  Today he presents to clinic for follow up. Here with daughter. Feeling less weak. Just doesn't feel as good as he thought he would after TAVR. Has been out on his tractor everyday. No CP or SOB. No LE edema, orthopnea or PND. Has some occasional dizziness or syncope. No blood in stool or urine. No palpitations.     Past Medical History:  Diagnosis Date  . Cancer ([place])    bladdre cancer  . Chronic combined systolic (congestive) and diastolic (congestive) heart failure ([place])   . Coronary artery disease   . Diabetes mellitus without complication ([place])   . History of bladder cancer   . History of  kidney stones   . History of TIA (transient ischemic attack)   . Hypertension   . PAF (paroxysmal atrial fibrillation) ([facility])    on Eliquis  . 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 Argentina Ponder  . Severe aortic stenosis     Past Surgical History:  Procedure Laterality Date  . CARDIAC CATHETERIZATION    . HERNIA REPAIR    . JOINT REPLACEMENT    . PROSTATE SURGERY    . RIGHT/LEFT HEART CATH AND CORONARY ANGIOGRAPHY N/A 09/03/2017   Procedure: RIGHT/LEFT HEART CATH AND CORONARY ANGIOGRAPHY;  Surgeon: Jolaine Artist, MD;  Location: [facility];  Service: Cardiovascular;  Laterality: N/A;  . RIGHT/LEFT HEART CATH AND CORONARY ANGIOGRAPHY N/A 10/19/2019   Procedure: RIGHT/LEFT HEART CATH AND CORONARY ANGIOGRAPHY;  Surgeon: Wellington Hampshire, MD;  Location: [facility];  Service: Cardiovascular;  Laterality: N/A;  . TEE WITHOUT CARDIOVERSION N/A 11/15/2019   Procedure: TRANSESOPHAGEAL ECHOCARDIOGRAM (TEE);  Surgeon: Sherren Mocha, MD;  Location: [facility];  Service: Open Heart Surgery;  Laterality: N/A;    Current Medications: Outpatient Medications Prior to Visit  Medication Sig Dispense Refill  . acetaminophen (TYLENOL 8 HOUR ARTHRITIS PAIN) 650 MG CR tablet Take 650 mg by mouth at bedtime.     Marland Kitchen acetaminophen (TYLENOL) 500 MG tablet Take 500 mg by mouth every 6 (six) hours as needed for mild pain (out of arthritis).    Marland Kitchen amiodarone (PACERONE) 200 MG tablet Take 1 tablet (200 mg total) by mouth daily. 90 tablet 1  . apixaban (ELIQUIS) 2.5 MG TABS tablet Take 1 tablet (2.5 mg total) by mouth 2 (two) times daily.    Marland Kitchen aspirin 81 MG chewable tablet Chew 1 tablet (81 mg total) by mouth daily.    . empagliflozin (JARDIANCE) 25 MG TABS tablet Take 25 mg by mouth daily.    Marland Kitchen ezetimibe (ZETIA) 10 MG tablet Take 1 tablet (10 mg total) by mouth daily. 30 tablet 3  . furosemide  (LASIX) 80 MG tablet Take 0.5 tablets (40 mg total) by mouth daily. 90 tablet 1  . Insulin Glargine (BASAGLAR KWIKPEN [place]) Inject 20 Units into the skin at bedtime.     Marland Kitchen losartan (COZAAR) 25 MG tablet TAKE 0.5 TABLET BY MOUTH DAILY (Patient taking differently: Take 12.5 mg by mouth daily. ) 30 tablet 3  . lovastatin (MEVACOR) 40 MG tablet Take 40 mg by mouth at bedtime.     . nitroGLYCERIN (NITROSTAT) 0.4 MG SL tablet DISSOLVE 1 TABLET UNDER THE TONGUE EVERY 5 MINUTES AS NEEDED FOR CHEST PAIN. (Patient taking differently: Place 0.4 mg under the tongue every 5 (five) minutes as needed for chest pain. ) 25 tablet 3  .  triamcinolone cream (KENALOG) 0.1 % Apply 1 application topically 2 (two) times daily as needed (itching).      No facility-administered medications prior to visit.     Allergies:   Spironolactone   Social History   Socioeconomic History  . Marital status: Married    Spouse name: Not on file  . Number of children: Not on file  . Years of education: Not on file  . Highest education level: Not on file  Occupational History  . Not on file  Tobacco Use  . Smoking status: Never Smoker  . Smokeless tobacco: Never Used  Substance and Sexual Activity  . Alcohol use: No  . Drug use: No  . Sexual activity: Not on file  Other Topics Concern  . Not on file  Social History Narrative  . Not on file   Social Determinants of Health   Financial Resource Strain: Low Risk   . Difficulty of Paying Living Expenses: Not hard at all  Food Insecurity: No Food Insecurity  . Worried About Charity fundraiser in the Last Year: Never true  . Ran Out of Food in the Last Year: Never true  Transportation Needs: No Transportation Needs  . Lack of Transportation (Medical): No  . Lack of Transportation (Non-Medical): No  Physical Activity:   . Days of Exercise per Week:   . Minutes of Exercise per Session:   Stress:   . Feeling of Stress :   Social Connections:   . Frequency of  Communication with Friends and Family:   . Frequency of Social Gatherings with Friends and Family:   . Attends Religious Services:   . Active Member of Clubs or Organizations:   . Attends Archivist Meetings:   Marland Kitchen Marital Status:      Family History:  The patient's family history includes Heart Problems in his brother; Heart attack in his father.     ROS:   Please see the history of present illness.    ROS All other systems reviewed and are negative.   PHYSICAL EXAM:   VS:  BP (!) 150/62   Pulse (!) 58   Ht 5\' 8"  (1.727 m)   Wt 161 lb 9.6 oz (73.3 kg)   SpO2 98%   BMI 24.57 kg/m    GEN: Well nourished, well developed, in no acute distress HEENT: normal Neck: no JVD or masses Cardiac: RRR; soft flow murmur. no rubs, or gallops,no edema  Respiratory:  clear to auscultation bilaterally, normal work of breathing GI: soft, nontender, nondistended, + BS MS: no deformity or atrophy Skin: warm and dry, no rash. Subclavian site healing well.  Neuro:  Alert and Oriented x 3, Strength and sensation are intact Psych: euthymic mood, full affect   Wt Readings from Last 3 Encounters:  12/15/19 161 lb 9.6 oz (73.3 kg)  11/21/19 164 lb 6.4 oz (74.6 kg)  11/16/19 171 lb 6.4 oz (77.7 kg)      Studies/Labs Reviewed:   EKG:  EKG is NOT ordered today.    Recent Labs: 11/11/2019: ALT 24; B Natriuretic Peptide 91.0 11/16/2019: Magnesium 1.9 11/28/2019: Hemoglobin 15.8; NT-Pro BNP 383; Platelets 192; TSH 1.190 12/15/2019: BUN 14; Creatinine, Ser 1.05; Potassium 4.3; Sodium 141   Lipid Panel    Component Value Date/Time   CHOL 193 06/20/2019 1247   TRIG 124 06/20/2019 1247   HDL 51 06/20/2019 1247   CHOLHDL 3.8 06/20/2019 1247   LDLCALC 120 (H) 06/20/2019 1247    Additional studies/  records that were reviewed today include:  TAVR OPERATIVE NOTE   Date of Procedure:11/15/2019  Preoperative Diagnosis:Severe Aortic Stenosis   Postoperative  Diagnosis:Same   Procedure:   Transcatheter Aortic Valve Replacement -Left Subclavian ArteryApproach Edwards Sapien 3 THV (size 2mm, model #9600TFX, serial [id number])  Co-Surgeons:Bryan Alveria Apley, MD and Sherren Mocha, MD   Anesthesiologist:Carswell Glennon Mac, MD  Echocardiographer:M. Croitoru, MD  Pre-operative Echo Findings: ? Severe aortic stenosis ? Normalleft ventricular systolic function  Post-operative Echo Findings: ? mildparavalvular leak ? Normalleft ventricular systolic function  _____________   Echo 11/16/19: IMPRESSIONS  1. Left ventricular ejection fraction, by estimation, is 40 to 45%. The  left ventricle has mildly decreased function. The left ventricle  demonstrates regional wall motion abnormalities. Left ventricular  diastolic parameters are consistent with Grade I  diastolic dysfunction (impaired relaxation). Elevated left atrial  pressure. There is mild hypokinesis of the left ventricular entire  inferolateral wall.  2. Right ventricular systolic function is normal. The right ventricular  size is normal. There is mildly elevated pulmonary artery systolic  pressure.  3. Left atrial size was moderately dilated.  4. Right atrial size was mildly dilated.  5. The mitral valve is normal in structure and function. Mild to moderate  mitral valve regurgitation.  6. The aortic valve has been repaired/replaced. There is a 29 mm Edwards  Sapien prosthetic (TAVR) valve present in the aortic position. Procedure  Date: 11/15/2019. Aortic valve area, by VTI measures 2.25 cm. Aortic  valve mean gradient measures 5.0 mmHg. Aortic valve Vmax measures 1.50 m/s. Dimensionless index 0.54.  Aortic valve regurgitation is mild (perivalvular).  7. The inferior vena cava is dilated in size with >50% respiratory  variability, suggesting right atrial pressure of 8 mmHg.      ___________________   Echo 12/15/19 IMPRESSIONS  1. Left ventricular ejection fraction, by estimation, is 40 to 45%. The left ventricle has mildly decreased function. The left ventricle demonstrates regional wall motion abnormalities (see scoring diagram/findings for description). There is moderate  left ventricular hypertrophy. Left ventricular diastolic parameters are consistent with Grade I diastolic dysfunction (impaired relaxation).  2. Right ventricular systolic function is normal. The right ventricular size is normal. There is normal pulmonary artery systolic pressure.  3. Left atrial size was moderately dilated.  4. Right atrial size was mildly dilated.  5. The mitral valve is normal in structure. Mild to moderate mitral valve regurgitation. No evidence of mitral stenosis.  6. The aortic valve is normal in structure. Aortic valve paravalvular regurgitation is mild to moderate. No prosthetic regurgitation. There is a 29 mm Edwards Sapien prosthetic (TAVR) valve present in the aortic position. Procedure Date: 11/15/19.  Aortic valve area, by VTI measures 1.88 cm. Aortic valve mean gradient measures 11.0 mmHg. Aortic valve Vmax measures 2.26 m/s.  7. Aortic dilatation noted. There is borderline dilatation of the ascending aorta measuring 38 mm.  8. The inferior vena cava is normal in size with greater than 50% respiratory variability, suggesting right atrial pressure of 3 mmHg.  Comparison(s): A prior study was performed on 11/16/19. Prior images reviewed side by side. Paravalvular regurgitation has increased.   ASSESSMENT & PLAN:   Severe AS s/p TAVR: echo today shows EF 40-45%, normally functioning TAVR with a mean gradient of 13 mm Hg and mild-moderate PVL (increased from mild) I reviewed this with Dr Burt Knack and feel it is stable. Will repeat echo in 6 month. SBE prophylaxis discussed; I have RX'd amoxicillin. Continue on aspirin and Eliquis. He can  stop aspirin 9/2. I will see him  back in 1 year with echo and CT chest.   New LBBB: he wore a 2 week Zio which showed 1st deg and 2nd deg type I AV block. There was no HAVB.  Chronic systolic CHF: EF down to [id number] after TAVR.  ? New LBBB causing dysynchrony and worsening EF. Echo today showed continued mild LV dysfunction. Continue on Losartan. No BB given baseline conduction dz.  HTN: BP elevated today but he did not take his Losartan or lasix today yet. No changes made.  CKD stage IIIb: creat improved 1.62--> 1.49. Will repeat BMET to follow trend.  Incidental findings: pre TAVR CT showed    Indeterminate liver lesions. Follow-up evaluation with nonemergent abdominal MRI with and without IV gadolinium is recommended in the near future to better characterize these findings and exclude malignancy. He thinks he has had this for 25 years. He would like to hold off on doing a MRI at this time and discuss with his PCP (Dr. 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 Recommend annual imaging followup by CTA or MRA. This will be followed over time. Will get this set up at 1 year appointment.   Medication Adjustments/Labs and Tests Ordered: Current medicines are reviewed at length with the patient today.  Concerns regarding medicines are outlined above.  Medication changes, Labs and Tests ordered today are listed in the Patient Instructions below. Patient Instructions  Medication Instructions:  Your physician has recommended you make the following change in your medication:  Stop aspirin after 05/17/20  *If you need a refill on your cardiac medications before your next appointment, please call your pharmacy*   Lab Work: BMET - today If you have labs (blood work) drawn today and your tests are completely normal, you will receive your results only by: Marland Kitchen MyChart Message (if you have MyChart) OR . A paper copy  in the mail If you have any lab test that is abnormal or we need to change your treatment, we will call you to review the results.   Testing/Procedures: Echo today   Follow-Up: At [facility], you and your health needs are our priority.  As part of our continuing mission to provide you with exceptional heart care, we have created designated Provider Care Teams.  These Care Teams include your primary Cardiologist (physician) and Advanced Practice Providers (APPs -  Physician Assistants and Nurse Practitioners) who all work together to provide you with the care you need, when you need it.  We recommend signing up for the patient portal called "MyChart".  Sign up information is provided on this After Visit Summary.  MyChart is used to connect with patients for Virtual Visits (Telemedicine).  Patients are able to view lab/test results, encounter notes, upcoming appointments, etc.  Non-urgent messages can be sent to your provider as well.   To learn more about what you can do with MyChart, go to [email or web address].    Your next appointment:   3 month(s)  The format for your next appointment:   In Person  Provider:   You will see Shirlee More, MD.  Or, you can be scheduled with the following Advanced Practice Provider on your designated Care Team (at our [facility]):  Laurann Montana, FNP     Other Instructions      Signed, Angelena Form, PA-C  12/20/2019 8:06 AM    [address], [place], [place]  [address] Phone: [phone]; Fax: [phone]

## 2019-12-14 DIAGNOSIS — N182 Chronic kidney disease, stage 2 (mild): Secondary | ICD-10-CM | POA: Diagnosis not present

## 2019-12-14 DIAGNOSIS — Z9660 Presence of unspecified orthopedic joint implant: Secondary | ICD-10-CM | POA: Diagnosis not present

## 2019-12-14 DIAGNOSIS — Z8551 Personal history of malignant neoplasm of bladder: Secondary | ICD-10-CM | POA: Diagnosis not present

## 2019-12-14 DIAGNOSIS — Z8673 Personal history of transient ischemic attack (TIA), and cerebral infarction without residual deficits: Secondary | ICD-10-CM | POA: Diagnosis not present

## 2019-12-14 DIAGNOSIS — Z954 Presence of other heart-valve replacement: Secondary | ICD-10-CM | POA: Diagnosis not present

## 2019-12-14 DIAGNOSIS — I350 Nonrheumatic aortic (valve) stenosis: Secondary | ICD-10-CM | POA: Diagnosis not present

## 2019-12-14 DIAGNOSIS — I129 Hypertensive chronic kidney disease with stage 1 through stage 4 chronic kidney disease, or unspecified chronic kidney disease: Secondary | ICD-10-CM | POA: Diagnosis not present

## 2019-12-14 DIAGNOSIS — Z7982 Long term (current) use of aspirin: Secondary | ICD-10-CM | POA: Diagnosis not present

## 2019-12-14 DIAGNOSIS — I2510 Atherosclerotic heart disease of native coronary artery without angina pectoris: Secondary | ICD-10-CM | POA: Diagnosis not present

## 2019-12-14 DIAGNOSIS — E1122 Type 2 diabetes mellitus with diabetic chronic kidney disease: Secondary | ICD-10-CM | POA: Diagnosis not present

## 2019-12-14 DIAGNOSIS — E782 Mixed hyperlipidemia: Secondary | ICD-10-CM | POA: Diagnosis not present

## 2019-12-14 DIAGNOSIS — I480 Paroxysmal atrial fibrillation: Secondary | ICD-10-CM | POA: Diagnosis not present

## 2019-12-14 DIAGNOSIS — E1169 Type 2 diabetes mellitus with other specified complication: Secondary | ICD-10-CM | POA: Diagnosis not present

## 2019-12-14 DIAGNOSIS — Z794 Long term (current) use of insulin: Secondary | ICD-10-CM | POA: Diagnosis not present

## 2019-12-14 DIAGNOSIS — Z48812 Encounter for surgical aftercare following surgery on the circulatory system: Secondary | ICD-10-CM | POA: Diagnosis not present

## 2019-12-14 DIAGNOSIS — Z7901 Long term (current) use of anticoagulants: Secondary | ICD-10-CM | POA: Diagnosis not present

## 2019-12-14 DIAGNOSIS — I5042 Chronic combined systolic (congestive) and diastolic (congestive) heart failure: Secondary | ICD-10-CM | POA: Diagnosis not present

## 2019-12-14 DIAGNOSIS — I130 Hypertensive heart and chronic kidney disease with heart failure and stage 1 through stage 4 chronic kidney disease, or unspecified chronic kidney disease: Secondary | ICD-10-CM | POA: Diagnosis not present

## 2019-12-15 ENCOUNTER — Encounter (INDEPENDENT_AMBULATORY_CARE_PROVIDER_SITE_OTHER): Payer: Self-pay

## 2019-12-15 ENCOUNTER — Other Ambulatory Visit: Payer: Self-pay

## 2019-12-15 ENCOUNTER — Ambulatory Visit: Payer: Medicare Other | Admitting: Physician Assistant

## 2019-12-15 ENCOUNTER — Ambulatory Visit (HOSPITAL_COMMUNITY): Payer: Medicare Other | Attending: Internal Medicine

## 2019-12-15 ENCOUNTER — Encounter: Payer: Self-pay | Admitting: Physician Assistant

## 2019-12-15 VITALS — BP 150/62 | HR 58 | Ht 68.0 in | Wt 161.6 lb

## 2019-12-15 DIAGNOSIS — Z952 Presence of prosthetic heart valve: Secondary | ICD-10-CM | POA: Insufficient documentation

## 2019-12-15 DIAGNOSIS — N1832 Chronic kidney disease, stage 3b: Secondary | ICD-10-CM | POA: Diagnosis not present

## 2019-12-15 DIAGNOSIS — K769 Liver disease, unspecified: Secondary | ICD-10-CM

## 2019-12-15 DIAGNOSIS — I10 Essential (primary) hypertension: Secondary | ICD-10-CM | POA: Diagnosis not present

## 2019-12-15 DIAGNOSIS — I447 Left bundle-branch block, unspecified: Secondary | ICD-10-CM

## 2019-12-15 DIAGNOSIS — Z9189 Other specified personal risk factors, not elsewhere classified: Secondary | ICD-10-CM

## 2019-12-15 NOTE — Patient Instructions (Signed)
Medication Instructions:  Your physician has recommended you make the following change in your medication:  Stop aspirin after 05/17/20  *If you need a refill on your cardiac medications before your next appointment, please call your pharmacy*   Lab Work: BMET - today If you have labs (blood work) drawn today and your tests are completely normal, you will receive your results only by: Marland Kitchen MyChart Message (if you have MyChart) OR . A paper copy in the mail If you have any lab test that is abnormal or we need to change your treatment, we will call you to review the results.   Testing/Procedures: Echo today   Follow-Up: At [facility], you and your health needs are our priority.  As part of our continuing mission to provide you with exceptional heart care, we have created designated Provider Care Teams.  These Care Teams include your primary Cardiologist (physician) and Advanced Practice Providers (APPs -  Physician Assistants and Nurse Practitioners) who all work together to provide you with the care you need, when you need it.  We recommend signing up for the patient portal called "MyChart".  Sign up information is provided on this After Visit Summary.  MyChart is used to connect with patients for Virtual Visits (Telemedicine).  Patients are able to view lab/test results, encounter notes, upcoming appointments, etc.  Non-urgent messages can be sent to your provider as well.   To learn more about what you can do with MyChart, go to [email or web address].    Your next appointment:   3 month(s)  The format for your next appointment:   In Person  Provider:   You will see Shirlee More, MD.  Or, you can be scheduled with the following Advanced Practice Provider on your designated Care Team (at our [facility]):  Laurann Montana, FNP     Other Instructions

## 2019-12-16 LAB — BASIC METABOLIC PANEL
BUN/Creatinine Ratio: 13 (ref 10–24)
BUN: 14 mg/dL (ref 8–27)
CO2: 22 mmol/L (ref 20–29)
Calcium: 9 mg/dL (ref 8.6–10.2)
Chloride: 106 mmol/L (ref 96–106)
Creatinine, Ser: 1.05 mg/dL (ref 0.76–1.27)
GFR calc Af Amer: 74 mL/min/{1.73_m2} (ref >59)
GFR calc non Af Amer: 64 mL/min/{1.73_m2} (ref >59)
Glucose: 112 mg/dL — ABNORMAL HIGH (ref 65–99)
Potassium: 4.3 mmol/L (ref 3.5–5.2)
Sodium: 141 mmol/L (ref 134–144)

## 2019-12-17 LAB — ECHOCARDIOGRAM COMPLETE
Height: 68 in
Weight: 2585.6 oz

## 2019-12-18 ENCOUNTER — Other Ambulatory Visit: Payer: Self-pay | Admitting: Physician Assistant

## 2019-12-19 ENCOUNTER — Encounter: Payer: Self-pay | Admitting: *Deleted

## 2019-12-19 ENCOUNTER — Other Ambulatory Visit: Payer: Self-pay | Admitting: *Deleted

## 2019-12-19 DIAGNOSIS — C672 Malignant neoplasm of lateral wall of bladder: Secondary | ICD-10-CM | POA: Diagnosis not present

## 2019-12-19 NOTE — Patient Outreach (Signed)
[facility]) Care Management  [place]  12/19/2019   Bernard Kim Mar 20, 1931 [id number]   RN Health Coach Post Procedure Outreach   Referral Date:  11/01/2018 Referral Source:  MD Office Reason for Referral:  Disease Management Education Insurance:  [place] Shield Medicare   Outreach Attempt:  Successful telephone outreach to patient for post procedural follow up.  HIPAA verified with patient.  Patient reporting he underwent TAVR on 11/15/19.  Directly after procedure patient reported feeling extremely fatigued and weak.  Denies falls.  States he is feeling some better and is receiving home health physical therapy (unsure of the agency).  Denies any lower extremity edema but does report shortness of breath on exertion.  Latest weight was 164 pounds (within his normal range of 162-169 pounds).  Continues to monitor blood sugars.  Fasting blood sugar this morning was 108 with recent fasting ranges of 80-100's.  Denies any significant hypoglycemic episodes.  Patient underwent cystoscopy today and states he feels fine at this moment.  Encounter Medications:  Outpatient Encounter Medications as of 12/19/2019  Medication Sig  . apixaban (ELIQUIS) 2.5 MG TABS tablet Take 1 tablet (2.5 mg total) by mouth 2 (two) times daily.  . furosemide (LASIX) 80 MG tablet Take 0.5 tablets (40 mg total) by mouth daily.  Marland Kitchen acetaminophen (TYLENOL 8 HOUR ARTHRITIS PAIN) 650 MG CR tablet Take 650 mg by mouth at bedtime.   Marland Kitchen acetaminophen (TYLENOL) 500 MG tablet Take 500 mg by mouth every 6 (six) hours as needed for mild pain (out of arthritis).  Marland Kitchen amiodarone (PACERONE) 200 MG tablet Take 1 tablet (200 mg total) by mouth daily.  Marland Kitchen aspirin 81 MG chewable tablet Chew 1 tablet (81 mg total) by mouth daily.  . empagliflozin (JARDIANCE) 25 MG TABS tablet Take 25 mg by mouth daily.  Marland Kitchen ezetimibe (ZETIA) 10 MG tablet Take 1 tablet (10 mg total) by mouth daily.  . Insulin Glargine (BASAGLAR  KWIKPEN [place]) Inject 20 Units into the skin at bedtime.   Marland Kitchen losartan (COZAAR) 25 MG tablet TAKE 0.5 TABLET BY MOUTH DAILY (Patient taking differently: Take 12.5 mg by mouth daily. )  . lovastatin (MEVACOR) 40 MG tablet Take 40 mg by mouth at bedtime.   . nitroGLYCERIN (NITROSTAT) 0.4 MG SL tablet DISSOLVE 1 TABLET UNDER THE TONGUE EVERY 5 MINUTES AS NEEDED FOR CHEST PAIN. (Patient taking differently: Place 0.4 mg under the tongue every 5 (five) minutes as needed for chest pain. )  . triamcinolone cream (KENALOG) 0.1 % Apply 1 application topically 2 (two) times daily as needed (itching).    No facility-administered encounter medications on file as of 12/19/2019.    Functional Status:  In your present state of health, do you have any difficulty performing the following activities: 11/16/2019 11/11/2019  Hearing? Y Y  Comment - -  Vision? N N  Difficulty concentrating or making decisions? N N  Walking or climbing stairs? Y Y  Dressing or bathing? N N  Doing errands, shopping? N N  Preparing Food and eating ? - -  Using the Toilet? - -  In the past six months, have you accidently leaked urine? - -  Do you have problems with loss of bowel control? - -  Managing your Medications? - -  Managing your Finances? - -  Housekeeping or managing your Housekeeping? - -  Some recent data might be hidden    Fall/Depression Screening: Fall Risk  12/19/2019 09/23/2019 08/01/2019  Falls  in the past year? 0 0 0  Number falls in past yr: 0 - -  Injury with Fall? 0 - -  Risk for fall due to : Impaired mobility;Impaired vision;Medication side effect Medication side effect;Impaired balance/gait;Impaired mobility;Impaired vision Medication side effect;Impaired mobility;Impaired balance/gait  Follow up Education provided;Falls prevention discussed;Falls evaluation completed Falls evaluation completed;Education provided;Falls prevention discussed Falls evaluation completed;Education provided;Falls prevention discussed    PHQ 2/9 Scores 12/19/2019 12/10/2018 11/01/2018  PHQ - 2 Score 0 0 0   THN CM Care Plan Problem One     Most Recent Value  Care Plan Problem One  Knowledge deficiet related to self care management of diabetes  Role Documenting the Problem One  [place] for Problem One  Active  THN Long Term Goal   Patient will report a decrease in Hgb A1C by 0.3 points in the next 90 days.  THN Long Term Goal Start Date  12/19/19  Interventions for Problem One Long Term Goal  Goals and care plan reviewed and discussed with patient, encouraged patient to continue to participate with home health therapy to help build strength and endurance, fall precautions reviewed and discussed, reviewed medications and encouraged medication compliance, encouraged to continue to weigh daily and discussed when to call physician based on weight, encouraged to continue to monitor blood sugars daily, reviewed fasting blood sugars and discused ways to keep within goal range, encouraged to keep and attend wcheduled medical appointments     Appointments:  States he is scheduling appointment with primary care provider, Dr. Nyra Capes for next week.  Attended appointment with Cardiology on 12/15/2019.  Plan: RN Health Coach will send primary care provider quarterly update. RN Health Coach will make next telephone outreach to patient within the month of May and patient agrees to future outreach.  [place] [phone] [email or web address]

## 2019-12-20 ENCOUNTER — Ambulatory Visit: Payer: Medicare Other | Admitting: *Deleted

## 2019-12-20 ENCOUNTER — Other Ambulatory Visit: Payer: Self-pay | Admitting: Physician Assistant

## 2019-12-20 DIAGNOSIS — I130 Hypertensive heart and chronic kidney disease with heart failure and stage 1 through stage 4 chronic kidney disease, or unspecified chronic kidney disease: Secondary | ICD-10-CM | POA: Diagnosis not present

## 2019-12-20 DIAGNOSIS — I5042 Chronic combined systolic (congestive) and diastolic (congestive) heart failure: Secondary | ICD-10-CM | POA: Diagnosis not present

## 2019-12-20 DIAGNOSIS — Z7982 Long term (current) use of aspirin: Secondary | ICD-10-CM | POA: Diagnosis not present

## 2019-12-20 DIAGNOSIS — N182 Chronic kidney disease, stage 2 (mild): Secondary | ICD-10-CM | POA: Diagnosis not present

## 2019-12-20 DIAGNOSIS — Z954 Presence of other heart-valve replacement: Secondary | ICD-10-CM | POA: Diagnosis not present

## 2019-12-20 DIAGNOSIS — E1122 Type 2 diabetes mellitus with diabetic chronic kidney disease: Secondary | ICD-10-CM | POA: Diagnosis not present

## 2019-12-20 DIAGNOSIS — Z8551 Personal history of malignant neoplasm of bladder: Secondary | ICD-10-CM | POA: Diagnosis not present

## 2019-12-20 DIAGNOSIS — Z8673 Personal history of transient ischemic attack (TIA), and cerebral infarction without residual deficits: Secondary | ICD-10-CM | POA: Diagnosis not present

## 2019-12-20 DIAGNOSIS — Z7901 Long term (current) use of anticoagulants: Secondary | ICD-10-CM | POA: Diagnosis not present

## 2019-12-20 DIAGNOSIS — Z9660 Presence of unspecified orthopedic joint implant: Secondary | ICD-10-CM | POA: Diagnosis not present

## 2019-12-20 DIAGNOSIS — I350 Nonrheumatic aortic (valve) stenosis: Secondary | ICD-10-CM | POA: Diagnosis not present

## 2019-12-20 DIAGNOSIS — I480 Paroxysmal atrial fibrillation: Secondary | ICD-10-CM | POA: Diagnosis not present

## 2019-12-20 DIAGNOSIS — Z48812 Encounter for surgical aftercare following surgery on the circulatory system: Secondary | ICD-10-CM | POA: Diagnosis not present

## 2019-12-20 DIAGNOSIS — Z794 Long term (current) use of insulin: Secondary | ICD-10-CM | POA: Diagnosis not present

## 2019-12-20 DIAGNOSIS — Z952 Presence of prosthetic heart valve: Secondary | ICD-10-CM

## 2019-12-20 DIAGNOSIS — I2510 Atherosclerotic heart disease of native coronary artery without angina pectoris: Secondary | ICD-10-CM | POA: Diagnosis not present

## 2019-12-21 ENCOUNTER — Encounter: Payer: Self-pay | Admitting: *Deleted

## 2019-12-23 ENCOUNTER — Other Ambulatory Visit: Payer: Self-pay | Admitting: Cardiology

## 2019-12-23 DIAGNOSIS — I5042 Chronic combined systolic (congestive) and diastolic (congestive) heart failure: Secondary | ICD-10-CM | POA: Diagnosis not present

## 2019-12-23 DIAGNOSIS — E1122 Type 2 diabetes mellitus with diabetic chronic kidney disease: Secondary | ICD-10-CM | POA: Diagnosis not present

## 2019-12-23 DIAGNOSIS — Z8551 Personal history of malignant neoplasm of bladder: Secondary | ICD-10-CM | POA: Diagnosis not present

## 2019-12-23 DIAGNOSIS — I2510 Atherosclerotic heart disease of native coronary artery without angina pectoris: Secondary | ICD-10-CM | POA: Diagnosis not present

## 2019-12-23 DIAGNOSIS — I130 Hypertensive heart and chronic kidney disease with heart failure and stage 1 through stage 4 chronic kidney disease, or unspecified chronic kidney disease: Secondary | ICD-10-CM | POA: Diagnosis not present

## 2019-12-23 DIAGNOSIS — Z9660 Presence of unspecified orthopedic joint implant: Secondary | ICD-10-CM | POA: Diagnosis not present

## 2019-12-23 DIAGNOSIS — Z48812 Encounter for surgical aftercare following surgery on the circulatory system: Secondary | ICD-10-CM | POA: Diagnosis not present

## 2019-12-23 DIAGNOSIS — Z7982 Long term (current) use of aspirin: Secondary | ICD-10-CM | POA: Diagnosis not present

## 2019-12-23 DIAGNOSIS — I480 Paroxysmal atrial fibrillation: Secondary | ICD-10-CM | POA: Diagnosis not present

## 2019-12-23 DIAGNOSIS — N182 Chronic kidney disease, stage 2 (mild): Secondary | ICD-10-CM | POA: Diagnosis not present

## 2019-12-23 DIAGNOSIS — Z954 Presence of other heart-valve replacement: Secondary | ICD-10-CM | POA: Diagnosis not present

## 2019-12-23 DIAGNOSIS — Z8673 Personal history of transient ischemic attack (TIA), and cerebral infarction without residual deficits: Secondary | ICD-10-CM | POA: Diagnosis not present

## 2019-12-23 DIAGNOSIS — I350 Nonrheumatic aortic (valve) stenosis: Secondary | ICD-10-CM | POA: Diagnosis not present

## 2019-12-23 DIAGNOSIS — Z794 Long term (current) use of insulin: Secondary | ICD-10-CM | POA: Diagnosis not present

## 2019-12-23 DIAGNOSIS — Z7901 Long term (current) use of anticoagulants: Secondary | ICD-10-CM | POA: Diagnosis not present

## 2019-12-29 DIAGNOSIS — Z48812 Encounter for surgical aftercare following surgery on the circulatory system: Secondary | ICD-10-CM | POA: Diagnosis not present

## 2019-12-29 DIAGNOSIS — E1122 Type 2 diabetes mellitus with diabetic chronic kidney disease: Secondary | ICD-10-CM | POA: Diagnosis not present

## 2019-12-29 DIAGNOSIS — Z9660 Presence of unspecified orthopedic joint implant: Secondary | ICD-10-CM | POA: Diagnosis not present

## 2019-12-29 DIAGNOSIS — I2510 Atherosclerotic heart disease of native coronary artery without angina pectoris: Secondary | ICD-10-CM | POA: Diagnosis not present

## 2019-12-29 DIAGNOSIS — Z7901 Long term (current) use of anticoagulants: Secondary | ICD-10-CM | POA: Diagnosis not present

## 2019-12-29 DIAGNOSIS — Z7982 Long term (current) use of aspirin: Secondary | ICD-10-CM | POA: Diagnosis not present

## 2019-12-29 DIAGNOSIS — Z8673 Personal history of transient ischemic attack (TIA), and cerebral infarction without residual deficits: Secondary | ICD-10-CM | POA: Diagnosis not present

## 2019-12-29 DIAGNOSIS — I350 Nonrheumatic aortic (valve) stenosis: Secondary | ICD-10-CM | POA: Diagnosis not present

## 2019-12-29 DIAGNOSIS — Z954 Presence of other heart-valve replacement: Secondary | ICD-10-CM | POA: Diagnosis not present

## 2019-12-29 DIAGNOSIS — I480 Paroxysmal atrial fibrillation: Secondary | ICD-10-CM | POA: Diagnosis not present

## 2019-12-29 DIAGNOSIS — N182 Chronic kidney disease, stage 2 (mild): Secondary | ICD-10-CM | POA: Diagnosis not present

## 2019-12-29 DIAGNOSIS — Z8551 Personal history of malignant neoplasm of bladder: Secondary | ICD-10-CM | POA: Diagnosis not present

## 2019-12-29 DIAGNOSIS — I130 Hypertensive heart and chronic kidney disease with heart failure and stage 1 through stage 4 chronic kidney disease, or unspecified chronic kidney disease: Secondary | ICD-10-CM | POA: Diagnosis not present

## 2019-12-29 DIAGNOSIS — I5042 Chronic combined systolic (congestive) and diastolic (congestive) heart failure: Secondary | ICD-10-CM | POA: Diagnosis not present

## 2019-12-29 DIAGNOSIS — Z794 Long term (current) use of insulin: Secondary | ICD-10-CM | POA: Diagnosis not present

## 2019-12-30 DIAGNOSIS — Z954 Presence of other heart-valve replacement: Secondary | ICD-10-CM | POA: Diagnosis not present

## 2019-12-30 DIAGNOSIS — N182 Chronic kidney disease, stage 2 (mild): Secondary | ICD-10-CM | POA: Diagnosis not present

## 2019-12-30 DIAGNOSIS — Z9660 Presence of unspecified orthopedic joint implant: Secondary | ICD-10-CM | POA: Diagnosis not present

## 2019-12-30 DIAGNOSIS — I2510 Atherosclerotic heart disease of native coronary artery without angina pectoris: Secondary | ICD-10-CM | POA: Diagnosis not present

## 2019-12-30 DIAGNOSIS — Z8673 Personal history of transient ischemic attack (TIA), and cerebral infarction without residual deficits: Secondary | ICD-10-CM | POA: Diagnosis not present

## 2019-12-30 DIAGNOSIS — Z7982 Long term (current) use of aspirin: Secondary | ICD-10-CM | POA: Diagnosis not present

## 2019-12-30 DIAGNOSIS — E1122 Type 2 diabetes mellitus with diabetic chronic kidney disease: Secondary | ICD-10-CM | POA: Diagnosis not present

## 2019-12-30 DIAGNOSIS — I480 Paroxysmal atrial fibrillation: Secondary | ICD-10-CM | POA: Diagnosis not present

## 2019-12-30 DIAGNOSIS — Z8551 Personal history of malignant neoplasm of bladder: Secondary | ICD-10-CM | POA: Diagnosis not present

## 2019-12-30 DIAGNOSIS — I350 Nonrheumatic aortic (valve) stenosis: Secondary | ICD-10-CM | POA: Diagnosis not present

## 2019-12-30 DIAGNOSIS — I130 Hypertensive heart and chronic kidney disease with heart failure and stage 1 through stage 4 chronic kidney disease, or unspecified chronic kidney disease: Secondary | ICD-10-CM | POA: Diagnosis not present

## 2019-12-30 DIAGNOSIS — Z7901 Long term (current) use of anticoagulants: Secondary | ICD-10-CM | POA: Diagnosis not present

## 2019-12-30 DIAGNOSIS — Z48812 Encounter for surgical aftercare following surgery on the circulatory system: Secondary | ICD-10-CM | POA: Diagnosis not present

## 2019-12-30 DIAGNOSIS — I5042 Chronic combined systolic (congestive) and diastolic (congestive) heart failure: Secondary | ICD-10-CM | POA: Diagnosis not present

## 2019-12-30 DIAGNOSIS — Z794 Long term (current) use of insulin: Secondary | ICD-10-CM | POA: Diagnosis not present

## 2020-01-03 DIAGNOSIS — I5042 Chronic combined systolic (congestive) and diastolic (congestive) heart failure: Secondary | ICD-10-CM | POA: Diagnosis not present

## 2020-01-03 DIAGNOSIS — I350 Nonrheumatic aortic (valve) stenosis: Secondary | ICD-10-CM | POA: Diagnosis not present

## 2020-01-03 DIAGNOSIS — Z9660 Presence of unspecified orthopedic joint implant: Secondary | ICD-10-CM | POA: Diagnosis not present

## 2020-01-03 DIAGNOSIS — Z48812 Encounter for surgical aftercare following surgery on the circulatory system: Secondary | ICD-10-CM | POA: Diagnosis not present

## 2020-01-03 DIAGNOSIS — Z954 Presence of other heart-valve replacement: Secondary | ICD-10-CM | POA: Diagnosis not present

## 2020-01-03 DIAGNOSIS — Z7982 Long term (current) use of aspirin: Secondary | ICD-10-CM | POA: Diagnosis not present

## 2020-01-03 DIAGNOSIS — E1122 Type 2 diabetes mellitus with diabetic chronic kidney disease: Secondary | ICD-10-CM | POA: Diagnosis not present

## 2020-01-03 DIAGNOSIS — Z794 Long term (current) use of insulin: Secondary | ICD-10-CM | POA: Diagnosis not present

## 2020-01-03 DIAGNOSIS — Z8673 Personal history of transient ischemic attack (TIA), and cerebral infarction without residual deficits: Secondary | ICD-10-CM | POA: Diagnosis not present

## 2020-01-03 DIAGNOSIS — I480 Paroxysmal atrial fibrillation: Secondary | ICD-10-CM | POA: Diagnosis not present

## 2020-01-03 DIAGNOSIS — Z7901 Long term (current) use of anticoagulants: Secondary | ICD-10-CM | POA: Diagnosis not present

## 2020-01-03 DIAGNOSIS — Z8551 Personal history of malignant neoplasm of bladder: Secondary | ICD-10-CM | POA: Diagnosis not present

## 2020-01-03 DIAGNOSIS — N182 Chronic kidney disease, stage 2 (mild): Secondary | ICD-10-CM | POA: Diagnosis not present

## 2020-01-03 DIAGNOSIS — I2510 Atherosclerotic heart disease of native coronary artery without angina pectoris: Secondary | ICD-10-CM | POA: Diagnosis not present

## 2020-01-03 DIAGNOSIS — I130 Hypertensive heart and chronic kidney disease with heart failure and stage 1 through stage 4 chronic kidney disease, or unspecified chronic kidney disease: Secondary | ICD-10-CM | POA: Diagnosis not present

## 2020-01-05 DIAGNOSIS — I350 Nonrheumatic aortic (valve) stenosis: Secondary | ICD-10-CM | POA: Diagnosis not present

## 2020-01-05 DIAGNOSIS — I480 Paroxysmal atrial fibrillation: Secondary | ICD-10-CM | POA: Diagnosis not present

## 2020-01-05 DIAGNOSIS — I130 Hypertensive heart and chronic kidney disease with heart failure and stage 1 through stage 4 chronic kidney disease, or unspecified chronic kidney disease: Secondary | ICD-10-CM | POA: Diagnosis not present

## 2020-01-05 DIAGNOSIS — Z7982 Long term (current) use of aspirin: Secondary | ICD-10-CM | POA: Diagnosis not present

## 2020-01-05 DIAGNOSIS — Z794 Long term (current) use of insulin: Secondary | ICD-10-CM | POA: Diagnosis not present

## 2020-01-05 DIAGNOSIS — E1122 Type 2 diabetes mellitus with diabetic chronic kidney disease: Secondary | ICD-10-CM | POA: Diagnosis not present

## 2020-01-05 DIAGNOSIS — I5042 Chronic combined systolic (congestive) and diastolic (congestive) heart failure: Secondary | ICD-10-CM | POA: Diagnosis not present

## 2020-01-05 DIAGNOSIS — Z7901 Long term (current) use of anticoagulants: Secondary | ICD-10-CM | POA: Diagnosis not present

## 2020-01-05 DIAGNOSIS — Z48812 Encounter for surgical aftercare following surgery on the circulatory system: Secondary | ICD-10-CM | POA: Diagnosis not present

## 2020-01-05 DIAGNOSIS — Z8673 Personal history of transient ischemic attack (TIA), and cerebral infarction without residual deficits: Secondary | ICD-10-CM | POA: Diagnosis not present

## 2020-01-05 DIAGNOSIS — N182 Chronic kidney disease, stage 2 (mild): Secondary | ICD-10-CM | POA: Diagnosis not present

## 2020-01-05 DIAGNOSIS — Z8551 Personal history of malignant neoplasm of bladder: Secondary | ICD-10-CM | POA: Diagnosis not present

## 2020-01-05 DIAGNOSIS — Z9660 Presence of unspecified orthopedic joint implant: Secondary | ICD-10-CM | POA: Diagnosis not present

## 2020-01-05 DIAGNOSIS — Z954 Presence of other heart-valve replacement: Secondary | ICD-10-CM | POA: Diagnosis not present

## 2020-01-05 DIAGNOSIS — I2510 Atherosclerotic heart disease of native coronary artery without angina pectoris: Secondary | ICD-10-CM | POA: Diagnosis not present

## 2020-01-10 DIAGNOSIS — M25571 Pain in right ankle and joints of right foot: Secondary | ICD-10-CM | POA: Diagnosis not present

## 2020-01-10 DIAGNOSIS — M19071 Primary osteoarthritis, right ankle and foot: Secondary | ICD-10-CM | POA: Diagnosis not present

## 2020-01-13 DIAGNOSIS — N182 Chronic kidney disease, stage 2 (mild): Secondary | ICD-10-CM | POA: Diagnosis not present

## 2020-01-13 DIAGNOSIS — I129 Hypertensive chronic kidney disease with stage 1 through stage 4 chronic kidney disease, or unspecified chronic kidney disease: Secondary | ICD-10-CM | POA: Diagnosis not present

## 2020-01-13 DIAGNOSIS — E1169 Type 2 diabetes mellitus with other specified complication: Secondary | ICD-10-CM | POA: Diagnosis not present

## 2020-01-13 DIAGNOSIS — E782 Mixed hyperlipidemia: Secondary | ICD-10-CM | POA: Diagnosis not present

## 2020-01-23 DIAGNOSIS — I130 Hypertensive heart and chronic kidney disease with heart failure and stage 1 through stage 4 chronic kidney disease, or unspecified chronic kidney disease: Secondary | ICD-10-CM | POA: Diagnosis not present

## 2020-01-23 DIAGNOSIS — I350 Nonrheumatic aortic (valve) stenosis: Secondary | ICD-10-CM | POA: Diagnosis not present

## 2020-01-23 DIAGNOSIS — Z794 Long term (current) use of insulin: Secondary | ICD-10-CM | POA: Diagnosis not present

## 2020-01-23 DIAGNOSIS — Z7982 Long term (current) use of aspirin: Secondary | ICD-10-CM | POA: Diagnosis not present

## 2020-01-23 DIAGNOSIS — Z48812 Encounter for surgical aftercare following surgery on the circulatory system: Secondary | ICD-10-CM | POA: Diagnosis not present

## 2020-01-23 DIAGNOSIS — I480 Paroxysmal atrial fibrillation: Secondary | ICD-10-CM | POA: Diagnosis not present

## 2020-01-23 DIAGNOSIS — I5042 Chronic combined systolic (congestive) and diastolic (congestive) heart failure: Secondary | ICD-10-CM | POA: Diagnosis not present

## 2020-01-23 DIAGNOSIS — E1169 Type 2 diabetes mellitus with other specified complication: Secondary | ICD-10-CM | POA: Diagnosis not present

## 2020-01-23 DIAGNOSIS — Z7901 Long term (current) use of anticoagulants: Secondary | ICD-10-CM | POA: Diagnosis not present

## 2020-01-23 DIAGNOSIS — I2510 Atherosclerotic heart disease of native coronary artery without angina pectoris: Secondary | ICD-10-CM | POA: Diagnosis not present

## 2020-01-23 DIAGNOSIS — N182 Chronic kidney disease, stage 2 (mild): Secondary | ICD-10-CM | POA: Diagnosis not present

## 2020-01-23 DIAGNOSIS — Z8673 Personal history of transient ischemic attack (TIA), and cerebral infarction without residual deficits: Secondary | ICD-10-CM | POA: Diagnosis not present

## 2020-01-23 DIAGNOSIS — Z954 Presence of other heart-valve replacement: Secondary | ICD-10-CM | POA: Diagnosis not present

## 2020-01-23 DIAGNOSIS — Z139 Encounter for screening, unspecified: Secondary | ICD-10-CM | POA: Diagnosis not present

## 2020-01-23 DIAGNOSIS — Z7189 Other specified counseling: Secondary | ICD-10-CM | POA: Diagnosis not present

## 2020-01-23 DIAGNOSIS — Z0000 Encounter for general adult medical examination without abnormal findings: Secondary | ICD-10-CM | POA: Diagnosis not present

## 2020-01-23 DIAGNOSIS — Z136 Encounter for screening for cardiovascular disorders: Secondary | ICD-10-CM | POA: Diagnosis not present

## 2020-01-23 DIAGNOSIS — E1122 Type 2 diabetes mellitus with diabetic chronic kidney disease: Secondary | ICD-10-CM | POA: Diagnosis not present

## 2020-01-23 DIAGNOSIS — Z8551 Personal history of malignant neoplasm of bladder: Secondary | ICD-10-CM | POA: Diagnosis not present

## 2020-01-23 DIAGNOSIS — Z9660 Presence of unspecified orthopedic joint implant: Secondary | ICD-10-CM | POA: Diagnosis not present

## 2020-01-25 ENCOUNTER — Other Ambulatory Visit: Payer: Self-pay | Admitting: *Deleted

## 2020-01-25 ENCOUNTER — Encounter: Payer: Self-pay | Admitting: *Deleted

## 2020-01-25 NOTE — Patient Outreach (Signed)
[facility]) Care Management  01/25/2020  Bernard Kim [birth year] [id number]   [place] ProcedureOutreach  Referral Date:11/01/2018 Referral Source:MD Office Reason for Referral:Disease Management Education Insurance:[employer] Shield Medicare   Outreach Attempt:   Successful telephone outreach to patient.  HIPAA verified with patient.  Patient reporting he continues to feel weak and tired.  States the home health physical therapy ended this week.  Denies any falls, just still weak.  Weighs almost daily, admits to forgetting some days.  Last weight was 165 pounds (within his range).  Denies any shortness of breath but does endorse some right ankle swelling.  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  Continues to monitor blood sugars.  Fasting blood sugar this morning was 113 with fasting ranges of 90-110's.  Appointments:  Patient states he had lab work completed this week and has scheduled appointment with primary care provider, Dr. Nyra Capes next week.  States he has appointment in 02/07/2020 with orthopedist to look at his ankle.  Plan: RN Health Coach will make next telephone outreach to patient within the month of July and patient agrees to future outreach.  [place] [phone] [name]

## 2020-01-30 DIAGNOSIS — R42 Dizziness and giddiness: Secondary | ICD-10-CM | POA: Diagnosis not present

## 2020-01-30 DIAGNOSIS — I129 Hypertensive chronic kidney disease with stage 1 through stage 4 chronic kidney disease, or unspecified chronic kidney disease: Secondary | ICD-10-CM | POA: Diagnosis not present

## 2020-01-30 DIAGNOSIS — E1169 Type 2 diabetes mellitus with other specified complication: Secondary | ICD-10-CM | POA: Diagnosis not present

## 2020-01-30 DIAGNOSIS — I959 Hypotension, unspecified: Secondary | ICD-10-CM | POA: Diagnosis not present

## 2020-01-30 DIAGNOSIS — N182 Chronic kidney disease, stage 2 (mild): Secondary | ICD-10-CM | POA: Diagnosis not present

## 2020-01-30 DIAGNOSIS — Z743 Need for continuous supervision: Secondary | ICD-10-CM | POA: Diagnosis not present

## 2020-01-30 DIAGNOSIS — G459 Transient cerebral ischemic attack, unspecified: Secondary | ICD-10-CM | POA: Diagnosis not present

## 2020-01-30 DIAGNOSIS — R918 Other nonspecific abnormal finding of lung field: Secondary | ICD-10-CM | POA: Diagnosis not present

## 2020-01-30 DIAGNOSIS — R531 Weakness: Secondary | ICD-10-CM | POA: Diagnosis not present

## 2020-01-30 DIAGNOSIS — E876 Hypokalemia: Secondary | ICD-10-CM | POA: Diagnosis not present

## 2020-01-30 DIAGNOSIS — E782 Mixed hyperlipidemia: Secondary | ICD-10-CM | POA: Diagnosis not present

## 2020-01-30 DIAGNOSIS — E1165 Type 2 diabetes mellitus with hyperglycemia: Secondary | ICD-10-CM | POA: Diagnosis not present

## 2020-02-07 DIAGNOSIS — M19071 Primary osteoarthritis, right ankle and foot: Secondary | ICD-10-CM | POA: Diagnosis not present

## 2020-02-08 DIAGNOSIS — R531 Weakness: Secondary | ICD-10-CM | POA: Diagnosis not present

## 2020-02-08 DIAGNOSIS — Z7689 Persons encountering health services in other specified circumstances: Secondary | ICD-10-CM | POA: Diagnosis not present

## 2020-02-08 DIAGNOSIS — E876 Hypokalemia: Secondary | ICD-10-CM | POA: Diagnosis not present

## 2020-02-12 ENCOUNTER — Other Ambulatory Visit: Payer: Self-pay | Admitting: Cardiology

## 2020-02-13 DIAGNOSIS — E782 Mixed hyperlipidemia: Secondary | ICD-10-CM | POA: Diagnosis not present

## 2020-02-13 DIAGNOSIS — N182 Chronic kidney disease, stage 2 (mild): Secondary | ICD-10-CM | POA: Diagnosis not present

## 2020-02-13 DIAGNOSIS — E1169 Type 2 diabetes mellitus with other specified complication: Secondary | ICD-10-CM | POA: Diagnosis not present

## 2020-02-13 DIAGNOSIS — I129 Hypertensive chronic kidney disease with stage 1 through stage 4 chronic kidney disease, or unspecified chronic kidney disease: Secondary | ICD-10-CM | POA: Diagnosis not present

## 2020-02-14 DIAGNOSIS — L814 Other melanin hyperpigmentation: Secondary | ICD-10-CM | POA: Diagnosis not present

## 2020-02-14 DIAGNOSIS — L570 Actinic keratosis: Secondary | ICD-10-CM | POA: Diagnosis not present

## 2020-02-14 DIAGNOSIS — L820 Inflamed seborrheic keratosis: Secondary | ICD-10-CM | POA: Diagnosis not present

## 2020-02-14 DIAGNOSIS — L578 Other skin changes due to chronic exposure to nonionizing radiation: Secondary | ICD-10-CM | POA: Diagnosis not present

## 2020-02-14 DIAGNOSIS — C44219 Basal cell carcinoma of skin of left ear and external auricular canal: Secondary | ICD-10-CM | POA: Diagnosis not present

## 2020-02-20 NOTE — Progress Notes (Signed)
Cardiology Office Note:    Date:  02/21/2020   ID:  AQUARIUS LATOUCHE, DOB Dec 13, 1930, MRN [id number]  PCP:  Marco Collie, MD  Cardiologist:  Shirlee More, MD    Referring MD: Marco Collie, MD    ASSESSMENT:    1. S/P TAVR (transcatheter aortic valve replacement)   2. Weakness   3. Paroxysmal A-fib ([place])   4. On amiodarone therapy   5. LBBB (left bundle branch block)   6. Coronary artery disease of native artery of native heart with stable angina pectoris ([place])   7. Essential hypertension   8. Stage 3b chronic kidney disease   9. Mixed hyperlipidemia    PLAN:    In order of problems listed above:  1. Fortunately he is beginning to steadily improve.  His functional capacity is limited by generalized weakness which I think is multifactorial including his preceding frailty nutrition perhaps statin myopathy and hypokalemia. 2. Stable maintain sinus rhythm continue amiodarone reduced dose anticoagulant we will check thyroids at risk for thyroid dysfunction 3. Stable improved EKG pattern PR interval is normal QRS is narrow I do not think you require pacemaker 4. Stable CAD continue medical therapy [place] Heart Association class I 5. Stable hypertension continue ARB recheck renal function and potassium 6. Recheck renal function potassium with CKD may need ongoing potassium supplements 7. May well have a statin myopathy discontinue the statin reassess at next visit   Next appointment: 3 months   Medication Adjustments/Labs and Tests Ordered: Current medicines are reviewed at length with the patient today.  Concerns regarding medicines are outlined above.  Orders Placed This Encounter  Procedures  . EKG 12-Lead   No orders of the defined types were placed in this encounter.   No chief complaint on file.   History of Present Illness:    Bernard Kim is a 86 y.o. male with a hx of aortic stenosis status post TAVR, paroxysmal atrial fibrillation on amiodarone hypertensive heart  disease coronary artery disease hyperlipidemia and heart failure.  He was last seen by me 10/12/2019 and referred to TAVR.  He underwent TAVR with a #29 [place] 3 valve 12/05/2019.  Postoperative echo showed EF diminished 40 to 45% with normally functioning TAVR mean  gradient of 5 mmHg mild paravalvular regurgitation.  He had new left bundle branch block.  He has had several visits with structural heart afterwards with complaints of weakness and fatigue.  He had a normal QRS duration and PR interval prior to TAVR.  Ambulatory event monitor in March showed marked first-degree heart block and type I second-degree AV block there were no prolonged pauses.  Compliance with diet, lifestyle and medications: Yes  His son from [place] is here participated in evaluation and decision making.  He is noticing his father slowly and steadily improving.  He still has a weakness at risk for statin myopathy I will discontinue being on amiodarone we will check his thyroid and recheck potassium with hypokalemia.  His EKG is improved his PR interval is normal and his QRS duration has narrowed.  Not having syncope edema shortness of breath and the family is paying close attention to his nutrition.  Study Highlights A ZIO monitor was performed for 14 days beginning 11/16/2019 following TAVR. The predominant rhythm is sinus with marked first-degree AV block and intraventricular conduction delay. There were no pauses of 3 seconds or greater.  There were intermittent dropped beats present is difficult to judge with the baseline whether this is  APCs that are blocked or more likely Mobitz 1 second-degree AV block.  There were no episodes of high degree heart block present. Ventricular ectopy is rare. Supraventricular ectopy was rare without episodes of atrial fibrillation or flutter. There were no triggered or symptomatic events.  Conclusion, first-degree AV block with grouped complexes most consistent with Mobitz 1  second-degree AV block.   He was seen at [facility] with generalized complaints of weakness in the emergency room 01/30/2020.  CBC showed a hemoglobin of 13.9 creatinine mildly elevated 1.30 GFR 52 cc troponin low 0.03 [place] proBNP level mid range 571 not elevated consistent with heart failure EKG showed left bundle branch block a rate of 63 bpm CT of the head was performed and showed no acute changes chest x-ray showed mild cardiomegaly.  He was hypokalemic with a potassium of 3.2.  He was given oral potassium supplements was discharged from the emergency room.  Past Medical History:  Diagnosis Date  . Bladder cancer ([place]) 09/22/2013  . CAD in native artery 02/23/2013   Overview:  Stent to [place] of this note might be different from the original. Stent to RCA 1995  . Cancer ([employer])    bladdre cancer  . Chronic combined systolic (congestive) and diastolic (congestive) heart failure ([place])   . CKD (chronic kidney disease) stage 3, GFR 30-59 ml/min 06/18/2019  . Coronary artery disease   . Coronary artery disease involving native coronary artery of native heart with angina pectoris ([place]) 04/09/2015   Formatting of this note might be different from the original. Catheterization 2015 showed a patent stent of the right coronary artery and nonobstructive CAD of LMCA and LAD  . Diabetes mellitus without complication ([place])   . Diabetes mellitus, type 2 ([place]) 02/25/2013  . Essential hypertension 02/23/2013  . History of bladder cancer   . History of kidney stones   . History of prostate cancer 04/04/2013  . History of TIA (transient ischemic attack)   . Hyperlipidemia 02/23/2013  . Hypertension   . Murmur 02/23/2013   Overview:  ECHO done by Dr. Earnest Rosier of this note might be different from the original. ECHO done by Dr. Bettina Gavia  . Non-rheumatic mitral regurgitation 04/09/2015   Overview:  Mild  Formatting of this note might be different from the original. Mild  . Nonrheumatic  aortic valve insufficiency 04/09/2015   Overview:  mild  Formatting of this note might be different from the original. mild  . On amiodarone therapy 06/20/2019  . PAF (paroxysmal atrial fibrillation) (H[facility])    on Eliquis  . Paroxysmal A-fib ([place]) 08/31/2017  . PVC (premature ventricular contraction) 06/06/2015  . 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 Argentina Ponder  . Severe aortic stenosis   . Transaminitis     Past Surgical History:  Procedure Laterality Date  . CARDIAC CATHETERIZATION    . HERNIA REPAIR    . JOINT REPLACEMENT    . PROSTATE SURGERY    . RIGHT/LEFT HEART CATH AND CORONARY ANGIOGRAPHY N/A 09/03/2017   Procedure: RIGHT/LEFT HEART CATH AND CORONARY ANGIOGRAPHY;  Surgeon: Jolaine Artist, MD;  Location: [place] [facility];  Service: Cardiovascular;  Laterality: N/A;  . RIGHT/LEFT HEART CATH AND CORONARY ANGIOGRAPHY N/A 10/19/2019   Procedure: RIGHT/LEFT HEART CATH AND CORONARY ANGIOGRAPHY;  Surgeon: Wellington Hampshire, MD;  Location: [place] [facility];  Service: Cardiovascular;  Laterality:  N/A;  . TEE WITHOUT CARDIOVERSION N/A 11/15/2019   Procedure: TRANSESOPHAGEAL ECHOCARDIOGRAM (TEE);  Surgeon: Sherren Mocha, MD;  Location: [place];  Service: Open Heart Surgery;  Laterality: N/A;    Current Medications: Current Meds  Medication Sig  . acetaminophen (TYLENOL 8 HOUR ARTHRITIS PAIN) 650 MG CR tablet Take 650 mg by mouth at bedtime.   Marland Kitchen acetaminophen (TYLENOL) 500 MG tablet Take 500 mg by mouth every 6 (six) hours as needed for mild pain (out of arthritis).  Marland Kitchen amiodarone (PACERONE) 200 MG tablet Take 1 tablet (200 mg total) by mouth daily.  Marland Kitchen apixaban (ELIQUIS) 2.5 MG TABS tablet Take 1 tablet (2.5 mg total) by mouth 2 (two) times daily.  Marland Kitchen aspirin 81 MG chewable tablet Chew 1 tablet (81 mg total) by mouth daily.  . empagliflozin (JARDIANCE) 25 MG TABS tablet Take 25  mg by mouth daily.  Marland Kitchen ezetimibe (ZETIA) 10 MG tablet TAKE 1 TABLET(10 MG) BY MOUTH DAILY  . furosemide (LASIX) 80 MG tablet Take 80 mg by mouth daily.  . Insulin Glargine (BASAGLAR KWIKPEN [place]) Inject 20 Units into the skin at bedtime.   Marland Kitchen losartan (COZAAR) 25 MG tablet TAKE 0.5 TABLET BY MOUTH DAILY  . nitroGLYCERIN (NITROSTAT) 0.4 MG SL tablet DISSOLVE 1 TABLET UNDER THE TONGUE EVERY 5 MINUTES AS NEEDED FOR CHEST PAIN.  Marland Kitchen triamcinolone cream (KENALOG) 0.1 % Apply 1 application topically 2 (two) times daily as needed (itching).   . [DISCONTINUED] lovastatin (MEVACOR) 40 MG tablet Take 40 mg by mouth at bedtime.      Allergies:   Spironolactone   Social History   Socioeconomic History  . Marital status: Married    Spouse name: Not on file  . Number of children: Not on file  . Years of education: Not on file  . Highest education level: Not on file  Occupational History  . Not on file  Tobacco Use  . Smoking status: Never Smoker  . Smokeless tobacco: Never Used  Substance and Sexual Activity  . Alcohol use: No  . Drug use: No  . Sexual activity: Not on file  Other Topics Concern  . Not on file  Social History Narrative  . Not on file   Social Determinants of Health   Financial Resource Strain: Low Risk   . Difficulty of Paying Living Expenses: Not hard at all  Food Insecurity: No Food Insecurity  . Worried About Charity fundraiser in the Last Year: Never true  . Ran Out of Food in the Last Year: Never true  Transportation Needs: No Transportation Needs  . Lack of Transportation (Medical): No  . Lack of Transportation (Non-Medical): No  Physical Activity:   . Days of Exercise per Week:   . Minutes of Exercise per Session:   Stress:   . Feeling of Stress :   Social Connections:   . Frequency of Communication with Friends and Family:   . Frequency of Social Gatherings with Friends and Family:   . Attends Religious Services:   . Active Member of Clubs or Organizations:    . Attends Archivist Meetings:   Marland Kitchen Marital Status:      Family History: The patient's family history includes Heart Problems in his brother; Heart attack in his father. ROS:   Please see the history of present illness.    All other systems reviewed and are negative.  EKGs/Labs/Other Studies Reviewed:    The following studies were reviewed today:  EKG:  EKG  ordered today and personally reviewed.  The ekg ordered today demonstrates sinus rhythm left bundle branch block QRS duration 160 ms  Recent Labs: 11/11/2019: ALT 24; B Natriuretic Peptide 91.0 11/16/2019: Magnesium 1.9 11/28/2019: Hemoglobin 15.8; NT-Pro BNP 383; Platelets 192; TSH 1.190 12/15/2019: BUN 14; Creatinine, Ser 1.05; Potassium 4.3; Sodium 141  Recent Lipid Panel    Component Value Date/Time   CHOL 193 06/20/2019 1247   TRIG 124 06/20/2019 1247   HDL 51 06/20/2019 1247   CHOLHDL 3.8 06/20/2019 1247   LDLCALC 120 (H) 06/20/2019 1247    Physical Exam:    VS:  BP (!) 142/68   Pulse (!) 59   Ht 5\' 8"  (1.727 m)   Wt 160 lb 6.4 oz (72.8 kg)   SpO2 98%   BMI 24.39 kg/m     Wt Readings from Last 3 Encounters:  02/21/20 160 lb 6.4 oz (72.8 kg)  12/15/19 161 lb 9.6 oz (73.3 kg)  11/21/19 164 lb 6.4 oz (74.6 kg)     GEN: He still looks frail well nourished, well developed in no acute distress HEENT: Normal NECK: No JVD; No carotid bruits LYMPHATICS: No lymphadenopathy CARDIAC: RRR, no murmurs, rubs, gallops RESPIRATORY:  Clear to auscultation without rales, wheezing or rhonchi  ABDOMEN: Soft, non-tender, non-distended MUSCULOSKELETAL:  No edema; No deformity  SKIN: Warm and dry NEUROLOGIC:  Alert and oriented x 3 PSYCHIATRIC:  Normal affect    Signed, Shirlee More, MD  02/21/2020 11:31 AM    [place]

## 2020-02-21 ENCOUNTER — Ambulatory Visit: Payer: Medicare Other | Admitting: Cardiology

## 2020-02-21 ENCOUNTER — Encounter: Payer: Self-pay | Admitting: Cardiology

## 2020-02-21 ENCOUNTER — Other Ambulatory Visit: Payer: Self-pay

## 2020-02-21 VITALS — BP 142/68 | HR 59 | Ht 68.0 in | Wt 160.4 lb

## 2020-02-21 DIAGNOSIS — Z952 Presence of prosthetic heart valve: Secondary | ICD-10-CM

## 2020-02-21 DIAGNOSIS — I480 Paroxysmal atrial fibrillation: Secondary | ICD-10-CM | POA: Diagnosis not present

## 2020-02-21 DIAGNOSIS — I25118 Atherosclerotic heart disease of native coronary artery with other forms of angina pectoris: Secondary | ICD-10-CM

## 2020-02-21 DIAGNOSIS — R531 Weakness: Secondary | ICD-10-CM

## 2020-02-21 DIAGNOSIS — I10 Essential (primary) hypertension: Secondary | ICD-10-CM | POA: Diagnosis not present

## 2020-02-21 DIAGNOSIS — I447 Left bundle-branch block, unspecified: Secondary | ICD-10-CM | POA: Diagnosis not present

## 2020-02-21 DIAGNOSIS — Z79899 Other long term (current) drug therapy: Secondary | ICD-10-CM | POA: Diagnosis not present

## 2020-02-21 DIAGNOSIS — N1832 Chronic kidney disease, stage 3b: Secondary | ICD-10-CM

## 2020-02-21 DIAGNOSIS — E782 Mixed hyperlipidemia: Secondary | ICD-10-CM

## 2020-02-21 NOTE — Patient Instructions (Signed)
Medication Instructions:  Your physician has recommended you make the following change in your medication:  STOP: Lovastatin.  *If you need a refill on your cardiac medications before your next appointment, please call your pharmacy*   Lab Work: Your physician recommends that you return for lab work in: [place], TSH, T3, T4 If you have labs (blood work) drawn today and your tests are completely normal, you will receive your results only by:  [place] (if you have [place]) OR  A paper copy in the mail If you have any lab test that is abnormal or we need to change your treatment, we will call you to review the results.   Testing/Procedures: None   Follow-Up: At [facility], you and your health needs are our priority.  As part of our continuing mission to provide you with exceptional heart care, we have created designated Provider Care Teams.  These Care Teams include your primary Cardiologist (physician) and Advanced Practice Providers (APPs -  Physician Assistants and Nurse Practitioners) who all work together to provide you with the care you need, when you need it.  We recommend signing up for the patient portal called "MyChart".  Sign up information is provided on this After Visit Summary.  MyChart is used to connect with patients for Virtual Visits (Telemedicine).  Patients are able to view lab/test results, encounter notes, upcoming appointments, etc.  Non-urgent messages can be sent to your provider as well.   To learn more about what you can do with MyChart, go to [email or web address].    Your next appointment:   3 month(s)  The format for your next appointment:   In Person  Provider:   Shirlee More, MD   Other Instructions

## 2020-02-22 ENCOUNTER — Telehealth: Payer: Self-pay

## 2020-02-22 LAB — COMPREHENSIVE METABOLIC PANEL
ALT: 38 IU/L (ref 0–44)
AST: 33 IU/L (ref 0–40)
Albumin/Globulin Ratio: 2 (ref 1.2–2.2)
Albumin: 4.5 g/dL (ref 3.6–4.6)
Alkaline Phosphatase: 91 IU/L (ref 48–121)
BUN/Creatinine Ratio: 17 (ref 10–24)
BUN: 25 mg/dL (ref 8–27)
Bilirubin Total: 0.7 mg/dL (ref 0.0–1.2)
CO2: 23 mmol/L (ref 20–29)
Calcium: 9.3 mg/dL (ref 8.6–10.2)
Chloride: 99 mmol/L (ref 96–106)
Creatinine, Ser: 1.48 mg/dL — ABNORMAL HIGH (ref 0.76–1.27)
GFR calc Af Amer: 48 mL/min/{1.73_m2} — ABNORMAL LOW (ref >59)
GFR calc non Af Amer: 42 mL/min/{1.73_m2} — ABNORMAL LOW (ref >59)
Globulin, Total: 2.2 g/dL (ref 1.5–4.5)
Glucose: 142 mg/dL — ABNORMAL HIGH (ref 65–99)
Potassium: 4.8 mmol/L (ref 3.5–5.2)
Sodium: 140 mmol/L (ref 134–144)
Total Protein: 6.7 g/dL (ref 6.0–8.5)

## 2020-02-22 LAB — TSH+T4F+T3FREE
Free T4: 1.77 ng/dL (ref 0.82–1.77)
T3, Free: 2.2 pg/mL (ref 2.0–4.4)
TSH: 1.36 u[IU]/mL (ref 0.450–4.500)

## 2020-02-22 NOTE — Telephone Encounter (Signed)
-----   Message from Richardo Priest, MD sent at 02/22/2020  7:43 AM EDT ----- Normal or stable result  No changes

## 2020-02-22 NOTE — Telephone Encounter (Signed)
Spoke with patient regarding results and recommendation.  Patient verbalizes understanding and is agreeable to plan of care. Advised patient to call back with any issues or concerns.  

## 2020-02-27 DIAGNOSIS — C44219 Basal cell carcinoma of skin of left ear and external auricular canal: Secondary | ICD-10-CM | POA: Diagnosis not present

## 2020-03-14 DIAGNOSIS — I129 Hypertensive chronic kidney disease with stage 1 through stage 4 chronic kidney disease, or unspecified chronic kidney disease: Secondary | ICD-10-CM | POA: Diagnosis not present

## 2020-03-14 DIAGNOSIS — N182 Chronic kidney disease, stage 2 (mild): Secondary | ICD-10-CM | POA: Diagnosis not present

## 2020-03-14 DIAGNOSIS — E782 Mixed hyperlipidemia: Secondary | ICD-10-CM | POA: Diagnosis not present

## 2020-03-14 DIAGNOSIS — E1169 Type 2 diabetes mellitus with other specified complication: Secondary | ICD-10-CM | POA: Diagnosis not present

## 2020-03-15 DIAGNOSIS — E119 Type 2 diabetes mellitus without complications: Secondary | ICD-10-CM | POA: Insufficient documentation

## 2020-03-15 DIAGNOSIS — B351 Tinea unguium: Secondary | ICD-10-CM

## 2020-03-15 HISTORY — DX: Tinea unguium: B35.1

## 2020-03-15 HISTORY — DX: Type 2 diabetes mellitus without complications: E11.9

## 2020-03-16 ENCOUNTER — Other Ambulatory Visit: Payer: Self-pay | Admitting: Cardiology

## 2020-03-21 DIAGNOSIS — S34139A Unspecified injury to sacral spinal cord, initial encounter: Secondary | ICD-10-CM | POA: Diagnosis not present

## 2020-03-21 DIAGNOSIS — S0990XA Unspecified injury of head, initial encounter: Secondary | ICD-10-CM | POA: Diagnosis not present

## 2020-03-21 DIAGNOSIS — R21 Rash and other nonspecific skin eruption: Secondary | ICD-10-CM | POA: Diagnosis not present

## 2020-03-21 DIAGNOSIS — I447 Left bundle-branch block, unspecified: Secondary | ICD-10-CM | POA: Diagnosis not present

## 2020-03-21 DIAGNOSIS — I7772 Dissection of iliac artery: Secondary | ICD-10-CM | POA: Diagnosis not present

## 2020-03-21 DIAGNOSIS — S79911A Unspecified injury of right hip, initial encounter: Secondary | ICD-10-CM | POA: Diagnosis not present

## 2020-03-21 DIAGNOSIS — G4489 Other headache syndrome: Secondary | ICD-10-CM | POA: Diagnosis not present

## 2020-03-21 DIAGNOSIS — S79912A Unspecified injury of left hip, initial encounter: Secondary | ICD-10-CM | POA: Diagnosis not present

## 2020-03-21 DIAGNOSIS — I517 Cardiomegaly: Secondary | ICD-10-CM | POA: Diagnosis not present

## 2020-03-21 DIAGNOSIS — R0902 Hypoxemia: Secondary | ICD-10-CM | POA: Diagnosis not present

## 2020-03-21 DIAGNOSIS — S199XXA Unspecified injury of neck, initial encounter: Secondary | ICD-10-CM | POA: Diagnosis not present

## 2020-03-21 DIAGNOSIS — N281 Cyst of kidney, acquired: Secondary | ICD-10-CM | POA: Diagnosis not present

## 2020-03-21 DIAGNOSIS — Z743 Need for continuous supervision: Secondary | ICD-10-CM | POA: Diagnosis not present

## 2020-03-21 DIAGNOSIS — S3991XA Unspecified injury of abdomen, initial encounter: Secondary | ICD-10-CM | POA: Diagnosis not present

## 2020-03-21 DIAGNOSIS — K5730 Diverticulosis of large intestine without perforation or abscess without bleeding: Secondary | ICD-10-CM | POA: Diagnosis not present

## 2020-03-21 DIAGNOSIS — S51811A Laceration without foreign body of right forearm, initial encounter: Secondary | ICD-10-CM | POA: Diagnosis not present

## 2020-03-21 DIAGNOSIS — R519 Headache, unspecified: Secondary | ICD-10-CM | POA: Diagnosis not present

## 2020-03-21 DIAGNOSIS — R55 Syncope and collapse: Secondary | ICD-10-CM | POA: Diagnosis not present

## 2020-03-22 DIAGNOSIS — R404 Transient alteration of awareness: Secondary | ICD-10-CM | POA: Diagnosis not present

## 2020-03-22 DIAGNOSIS — E162 Hypoglycemia, unspecified: Secondary | ICD-10-CM | POA: Diagnosis not present

## 2020-03-22 DIAGNOSIS — K5730 Diverticulosis of large intestine without perforation or abscess without bleeding: Secondary | ICD-10-CM | POA: Diagnosis not present

## 2020-03-22 DIAGNOSIS — E161 Other hypoglycemia: Secondary | ICD-10-CM | POA: Diagnosis not present

## 2020-03-22 DIAGNOSIS — I7772 Dissection of iliac artery: Secondary | ICD-10-CM | POA: Diagnosis not present

## 2020-03-22 DIAGNOSIS — R0902 Hypoxemia: Secondary | ICD-10-CM | POA: Diagnosis not present

## 2020-03-22 DIAGNOSIS — S3991XA Unspecified injury of abdomen, initial encounter: Secondary | ICD-10-CM | POA: Diagnosis not present

## 2020-03-22 DIAGNOSIS — N281 Cyst of kidney, acquired: Secondary | ICD-10-CM | POA: Diagnosis not present

## 2020-03-26 DIAGNOSIS — C672 Malignant neoplasm of lateral wall of bladder: Secondary | ICD-10-CM | POA: Diagnosis not present

## 2020-03-27 DIAGNOSIS — E1149 Type 2 diabetes mellitus with other diabetic neurological complication: Secondary | ICD-10-CM | POA: Diagnosis not present

## 2020-03-27 DIAGNOSIS — Z7689 Persons encountering health services in other specified circumstances: Secondary | ICD-10-CM | POA: Diagnosis not present

## 2020-03-27 DIAGNOSIS — R634 Abnormal weight loss: Secondary | ICD-10-CM | POA: Diagnosis not present

## 2020-03-27 DIAGNOSIS — E162 Hypoglycemia, unspecified: Secondary | ICD-10-CM | POA: Diagnosis not present

## 2020-03-28 ENCOUNTER — Encounter: Payer: Self-pay | Admitting: *Deleted

## 2020-03-28 ENCOUNTER — Other Ambulatory Visit: Payer: Self-pay | Admitting: *Deleted

## 2020-03-28 NOTE — Patient Outreach (Signed)
[place] [facility]) Care Management  [place]  03/28/2020   Bernard Kim 13-Aug-1931 [id number]   [place] Every Other Month Outreach   Referral Date:  11/01/2018 Referral Source:  MD Office Reason for Referral:  Disease Management Education Insurance:  [place] Medicare   Outreach Attempt:  Successful telephone outreach to patient for follow up.  HIPAA verified with patient.  Patient reporting he has been having a hard time.  Reports he has had 3 falls in the last week with some episodes of hypoglycemia.  States one fall was in the shower.  Has had to call EMS to the home at least 2 times.  Blood sugars has been as low as 40.  Currently is not taking Jardiance nor Basaglar insulin due to hypoglycemia and follow up appointment next week with primary care per provider instructions.  Reports decrease in appetite with weight loss.  Weight this morning was 153 pounds.  Denies any increase in shortness of breath.  Patient and wife report male name Heidi from [facility] (not sure which one and she did not leave contact information), visiting today to perform assessment.  States she told them she would contact primary care for possible home health assistance for patient.  Wife does state she could use help caring for patient in the home.  Encouraged patient and wife to contact this [place] if assistance was needed for home health arrangements.  Encounter Medications:  Outpatient Encounter Medications as of 03/28/2020  Medication Sig Note  . acetaminophen (TYLENOL 8 HOUR ARTHRITIS PAIN) 650 MG CR tablet Take 650 mg by mouth at bedtime.    Marland Kitchen amiodarone (PACERONE) 200 MG tablet Take 1 tablet (200 mg total) by mouth daily.   Marland Kitchen apixaban (ELIQUIS) 2.5 MG TABS tablet Take 1 tablet (2.5 mg total) by mouth 2 (two) times daily.   Marland Kitchen aspirin 81 MG chewable tablet Chew 1 tablet (81 mg total) by mouth daily.   Marland Kitchen ezetimibe (ZETIA) 10 MG tablet TAKE 1  TABLET(10 MG) BY MOUTH DAILY   . furosemide (LASIX) 80 MG tablet Take 80 mg by mouth daily.   Marland Kitchen losartan (COZAAR) 25 MG tablet TAKE 0.5 TABLET BY MOUTH DAILY   . acetaminophen (TYLENOL) 500 MG tablet Take 500 mg by mouth every 6 (six) hours as needed for mild pain (out of arthritis).   Marland Kitchen empagliflozin (JARDIANCE) 25 MG TABS tablet Take 25 mg by mouth daily. (Patient not taking: Reported on 03/28/2020) 03/28/2020: Not taking currently, due to hypoglycemia  . Insulin Glargine (BASAGLAR KWIKPEN [place]) Inject 20 Units into the skin at bedtime.  (Patient not taking: Reported on 03/28/2020) 03/28/2020: Not taking currently due to hypoglycemia  . nitroGLYCERIN (NITROSTAT) 0.4 MG SL tablet DISSOLVE 1 TABLET UNDER THE TONGUE EVERY 5 MINUTES AS NEEDED FOR CHEST PAIN   . triamcinolone cream (KENALOG) 0.1 % Apply 1 application topically 2 (two) times daily as needed (itching).     No facility-administered encounter medications on file as of 03/28/2020.    Functional Status:  In your present state of health, do you have any difficulty performing the following activities: 11/16/2019 11/11/2019  Hearing? Y Y  Comment - -  Vision? N N  Difficulty concentrating or making decisions? N N  Walking or climbing stairs? Y Y  Dressing or bathing? N N  Doing errands, shopping? N N  Preparing Food and eating ? - -  Using the Toilet? - -  In the past  six months, have you accidently leaked urine? - -  Do you have problems with loss of bowel control? - -  Managing your Medications? - -  Managing your Finances? - -  Housekeeping or managing your Housekeeping? - -  Some recent data might be hidden    Fall/Depression Screening: Fall Risk  03/28/2020 01/25/2020 12/19/2019  Falls in the past year? 1 0 0  Number falls in past yr: 1 0 0  Injury with Fall? 0 0 0  Risk for fall due to : History of fall(s);Impaired balance/gait;Impaired mobility;Impaired vision;Medication side effect Impaired balance/gait;Impaired mobility;Impaired  vision;Medication side effect Impaired mobility;Impaired vision;Medication side effect  Follow up Falls evaluation completed;Education provided;Falls prevention discussed Falls evaluation completed;Education provided;Falls prevention discussed Education provided;Falls prevention discussed;Falls evaluation completed   PHQ 2/9 Scores 12/19/2019 12/10/2018 11/01/2018  PHQ - 2 Score 0 0 0   Goals Addressed              This Visit's Progress   .  Patient will report maintaining Hgb A1C of 7 or below within the next 90 days. (pt-stated)        CARE PLAN ENTRY (see longtitudinal plan of care for additional care plan information)  Objective:  Lab Results  Component Value Date   HGBA1C 6.9 (H) 11/11/2019 .   Lab Results  Component Value Date   CREATININE 1.48 (H) 02/21/2020   CREATININE 1.05 12/15/2019   CREATININE 1.49 (H) 11/28/2019 .   Marland Kitchen No results found for: EGFR  Current Barriers:  Marland Kitchen Knowledge Deficits related to basic Diabetes pathophysiology and self care/management . Knowledge Deficits related to medications used for management of diabetes  Case Manager Clinical Goal(s):  Over the next 90 days, patient will demonstrate improved adherence to prescribed treatment plan for diabetes self care/management as evidenced by: maintaining Hgb A1C of 7 or below . Verbalize daily monitoring and recording of CBG within 30 days . Verbalize adherence to ADA/ carb modified diet within the next 30 days . Verbalize adherence to prescribed medication regimen within the next 30 days  . Verbalize monitoring blood sugars 3 times a day and recording the readings for provider review in the next week to help with hypoglycemia treatment  Interventions:  . Provided education to patient about basic DM disease process . Reviewed medications with patient and discussed importance of medication adherence . Discussed plans with patient for ongoing care management follow up and provided patient with direct  contact information for care management team . Provided patient with written educational materials related to hypo and hyperglycemia and importance of correct treatment . Reviewed scheduled/upcoming provider appointments including: follow up appointment next Thursday with primary care provider to follow up hypoglycemia . Advised patient, providing education and rationale, to check cbg 3 times a day and record, calling primary care provider for findings outside established parameters.    Patient Self Care Activities:  . Self administers oral medications as prescribed . Self administers insulin as prescribed . Attends all scheduled provider appointments . Checks blood sugars as prescribed and utilize hyper and hypoglycemia protocol as needed . Adheres to prescribed ADA/carb modified and eats at least 3 meals a day and bedtime snack  Initial goal documentation   CARE PLAN ENTRY (see longitudinal plan of care for additional care plan information)  Current Barriers:  Marland Kitchen Knowledge Deficits related to fall precautions . Decreased adherence to prescribed treatment for fall prevention  Clinical Goal(s):  Marland Kitchen Over the next 30 days, patient will demonstrate improved adherence  to prescribed treatment plan for decreasing falls as evidenced by patient reporting and review of EMR . Over the next 30 days, patient will verbalize using fall risk reduction strategies discussed . Over the next 30 days, patient will not experience additional falls .   Interventions:  . Provided verbal education re: Potential causes of falls and Fall prevention strategies . Reviewed medications and discussed potential side effects of medications such as dizziness and frequent urination . Assessed for s/s of orthostatic hypotension . Assessed for falls since last encounter. . Assessed patients knowledge of fall risk prevention secondary to previously provided education. . Advised patient to use walker with all ambulation and  to change positions slowly . Provided education to patient re: fall precautions and preventions reviewed and discussed  Patient Self Care Activities:  . Utilize walker (assistive device) appropriately with all ambulation . De-clutter walkways . Change positions slowly . Wear secure fitting shoes at all times with ambulation . Utilize home lighting for dim lit areas . Have self and pet awareness at all times   Initial goal documentation       Appointments:  Reports attending appointment at primary care office on 03/27/2020 and has follow up scheduled for 04/05/2020.  Plan: RN Health Coach will send primary care provider quarterly update. RN Health Coach will make next telephone outreach to patient within the month of August and patient agrees.  [place] [phone] [email or web address]

## 2020-04-05 DIAGNOSIS — Z7189 Other specified counseling: Secondary | ICD-10-CM | POA: Diagnosis not present

## 2020-04-05 DIAGNOSIS — E782 Mixed hyperlipidemia: Secondary | ICD-10-CM | POA: Diagnosis not present

## 2020-04-05 DIAGNOSIS — I5020 Unspecified systolic (congestive) heart failure: Secondary | ICD-10-CM | POA: Diagnosis not present

## 2020-04-05 DIAGNOSIS — C679 Malignant neoplasm of bladder, unspecified: Secondary | ICD-10-CM | POA: Diagnosis not present

## 2020-04-05 DIAGNOSIS — E1169 Type 2 diabetes mellitus with other specified complication: Secondary | ICD-10-CM | POA: Diagnosis not present

## 2020-04-15 ENCOUNTER — Other Ambulatory Visit: Payer: Self-pay | Admitting: Cardiology

## 2020-04-15 DIAGNOSIS — E782 Mixed hyperlipidemia: Secondary | ICD-10-CM | POA: Diagnosis not present

## 2020-04-15 DIAGNOSIS — 419620001 Death: Secondary | SNOMED CT | POA: Diagnosis not present

## 2020-04-15 DIAGNOSIS — C679 Malignant neoplasm of bladder, unspecified: Secondary | ICD-10-CM | POA: Diagnosis not present

## 2020-04-15 DIAGNOSIS — E1169 Type 2 diabetes mellitus with other specified complication: Secondary | ICD-10-CM | POA: Diagnosis not present

## 2020-04-15 DIAGNOSIS — I5020 Unspecified systolic (congestive) heart failure: Secondary | ICD-10-CM | POA: Diagnosis not present

## 2020-04-15 DEATH — deceased

## 2020-04-17 ENCOUNTER — Other Ambulatory Visit: Payer: Self-pay | Admitting: *Deleted

## 2020-04-17 ENCOUNTER — Encounter: Payer: Self-pay | Admitting: *Deleted

## 2020-04-17 NOTE — Patient Outreach (Signed)
[place] [facility]) Care Management  04/17/2020  Bernard Kim 12/21/1930 [id number]   [place] Monthly Outreach  Referral Date:11/01/2018 Referral Source:MD Office Reason for Referral:Disease Management Education Insurance:Blue Cross Blue Shield Medicare   Outreach Attempt:  Successful telephone outreach to patient's wife for follow up (patient resting).  HIPAA verified with wife.  Wife reporting patient continues to not feel well.  States he is very weak.  Denies any more falls in the last 2 weeks and states his blood sugars have been better controlled.  Fasting blood sugar this morning was 142 with morning fasting ranges of 110-140's and afternoon blood sugars ranging 170-180's.  Continues to not be on any diabetic medications at this time per family.  Spoke with patient's daughter who is there to help care for her parents.  She states they have not gotten home health ordered and are not looking into this at the moment.  States patient's wife is having shoulder surgery in the next few weeks and her brother is coming from [place] to help care for her father while she cares for her mother.  Encouraged daughter to contact this [place] if they decided to pursue home health and needed assistance arranging.  Appointments:  Attended appointment with primary care provider, Dr. Nyra Capes on 04/05/2020 and has follow up scheduled on 05/03/2020.  Plan: RN Health Coach will make next telephone outreach to patient within the month of September and family agrees to future outreach.  [place] Coach [phone] [email or web address]

## 2020-04-26 DIAGNOSIS — E1169 Type 2 diabetes mellitus with other specified complication: Secondary | ICD-10-CM | POA: Diagnosis not present

## 2020-05-03 DIAGNOSIS — E782 Mixed hyperlipidemia: Secondary | ICD-10-CM | POA: Diagnosis not present

## 2020-05-03 DIAGNOSIS — I5020 Unspecified systolic (congestive) heart failure: Secondary | ICD-10-CM | POA: Diagnosis not present

## 2020-05-03 DIAGNOSIS — E1169 Type 2 diabetes mellitus with other specified complication: Secondary | ICD-10-CM | POA: Diagnosis not present

## 2020-05-03 DIAGNOSIS — I2510 Atherosclerotic heart disease of native coronary artery without angina pectoris: Secondary | ICD-10-CM | POA: Diagnosis not present

## 2020-05-15 DIAGNOSIS — I5020 Unspecified systolic (congestive) heart failure: Secondary | ICD-10-CM | POA: Diagnosis not present

## 2020-05-15 DIAGNOSIS — E782 Mixed hyperlipidemia: Secondary | ICD-10-CM | POA: Diagnosis not present

## 2020-05-15 DIAGNOSIS — E1169 Type 2 diabetes mellitus with other specified complication: Secondary | ICD-10-CM | POA: Diagnosis not present

## 2020-05-16 DIAGNOSIS — I5020 Unspecified systolic (congestive) heart failure: Secondary | ICD-10-CM | POA: Diagnosis not present

## 2020-05-16 DIAGNOSIS — E1169 Type 2 diabetes mellitus with other specified complication: Secondary | ICD-10-CM | POA: Diagnosis not present

## 2020-05-16 DIAGNOSIS — E782 Mixed hyperlipidemia: Secondary | ICD-10-CM | POA: Diagnosis not present

## 2020-06-01 ENCOUNTER — Other Ambulatory Visit: Payer: Self-pay

## 2020-06-01 DIAGNOSIS — E119 Type 2 diabetes mellitus without complications: Secondary | ICD-10-CM | POA: Insufficient documentation

## 2020-06-01 DIAGNOSIS — I480 Paroxysmal atrial fibrillation: Secondary | ICD-10-CM | POA: Insufficient documentation

## 2020-06-01 DIAGNOSIS — C801 Malignant (primary) neoplasm, unspecified: Secondary | ICD-10-CM | POA: Insufficient documentation

## 2020-06-01 DIAGNOSIS — I5042 Chronic combined systolic (congestive) and diastolic (congestive) heart failure: Secondary | ICD-10-CM | POA: Insufficient documentation

## 2020-06-01 DIAGNOSIS — Z8551 Personal history of malignant neoplasm of bladder: Secondary | ICD-10-CM | POA: Insufficient documentation

## 2020-06-01 DIAGNOSIS — Z87442 Personal history of urinary calculi: Secondary | ICD-10-CM | POA: Insufficient documentation

## 2020-06-01 DIAGNOSIS — Z8673 Personal history of transient ischemic attack (TIA), and cerebral infarction without residual deficits: Secondary | ICD-10-CM | POA: Insufficient documentation

## 2020-06-03 NOTE — Progress Notes (Signed)
Cardiology Office Note:    Date:  06/04/2020   ID:  Bernard SEEHAFER, DOB [birth year], MRN [id number]  PCP:  Marco Collie, MD  Cardiologist:  Shirlee More, MD    Referring MD: Marco Collie, MD    ASSESSMENT:    1. S/P TAVR (transcatheter aortic valve replacement)   2. Paroxysmal A-fib ([place])   3. On amiodarone therapy   4. Chronic anticoagulation   5. Chronic combined systolic (congestive) and diastolic (congestive) heart failure ([place])   6. Hypertensive heart disease with chronic combined systolic and diastolic congestive heart failure (HCC)   7. Stage 3b chronic kidney disease   8. Mixed hyperlipidemia   9. Weakness   10. Statin intolerance    PLAN:    In order of problems listed above:  1. In general he is slowly doing better he is stable and is recovered from TAVR and his aortic regurgitation is not significant.  He will continue antiplatelet and anticoagulant with atrial fibrillation maintaining sinus rhythm continue low-dose amiodarone and reduced dose anticoagulant with age and renal function. 2. Heart failure is compensated no fluid overload continue low-dose loop diuretic and guideline directed treatment ARB for hypertension. 3. BP at target continue current treatment 4. Stable CKD 5. Continue a low intensity statin he does not tolerate higher preps lovastatin plus Zetia 6. Continued weakness and frailty but slowly and steadily improving 7. For him in view of his age and overall health I think his lipids are at target continue current treatment   Next appointment: 6 months   Medication Adjustments/Labs and Tests Ordered: Current medicines are reviewed at length with the patient today.  Concerns regarding medicines are outlined above.  Orders Placed This Encounter  Procedures  . EKG 12-Lead   No orders of the defined types were placed in this encounter.   Chief Complaint  Patient presents with  . Follow-up    After TAVR  . Atrial Fibrillation  . Anticoagulation    . Congestive Heart Failure  . Coronary Artery Disease    History of Present Illness:    Bernard Kim is a 86 y.o. male with a hx of aortic stenosis status post TAVR, paroxysmal atrial fibrillation on amiodarone hypertensive heart disease coronary artery disease hyperlipidemia and heart failure. He underwent TAVR with a #29 Dixon 3 valve 12/05/2019.  Postoperative echo showed EF diminished 40 to 45% with normally functioning TAVR mean  gradient of 5 mmHg and mild paravalvular regurgitation.  He had new left bundle branch block.  He has had several visits with structural heart afterwards with complaints of weakness and fatigue.  He had a normal QRS duration and PR interval prior to TAVR.  Ambulatory event monitor in March showed marked first-degree heart block and type I second-degree AV block there were no prolonged pauses.  He was last seen 02/21/2020. Compliance with diet, lifestyle and medications: Yes  He was seen at [facility] after a fall 2007 03/2020.  Testing showed normal CBC hemoglobin 14.8 CT of the abdomen pelvis showed no fracture creatinine mildly elevated 1.2 GFR 57 cc proBNP level was elevated 1340 EKG showed sinus rhythm left bundle branch block x-ray of the hip showed no fracture.  He was discharged home from the emergency department  His son from [place] is present his father slowly doing better and is now working outdoors he just complains of chronic fatigue and is frail.  Has had no further falls no chest pain shortness breath palpitation or syncope and  no bleeding complication from his antiplatelet anticoagulant therapy.  Recent labs reviewed from his PCP office 04/26/2020 cholesterol 218 LDL 120 anxiety he is poorly statin ttolerant HDL 46 triglycerides 101 A1c at target 6.5% TSH normal 1.36 creatinine stable 1.52.  His EKG in my office today shows sinus bradycardia first-degree AV block left bundle branch block Past Medical History:  Diagnosis Date  . Bladder  cancer ([place]) 09/22/2013  . CAD in native artery 02/23/2013   Overview:  Stent to [place] of this note might be different from the original. Stent to RCA 1995  . Cancer ([place])    bladdre cancer  . Chronic combined systolic (congestive) and diastolic (congestive) heart failure ([place])   . CKD (chronic kidney disease) stage 3, GFR 30-59 ml/min 06/18/2019  . Comprehensive diabetic foot examination, type 2 DM, encounter for ([place]) 03/15/2020  . Coronary artery disease   . Coronary artery disease involving native coronary artery of native heart with angina pectoris ([place]) 04/09/2015   Formatting of this note might be different from the original. Catheterization 2015 showed a patent stent of the right coronary artery and nonobstructive CAD of LMCA and LAD  . Diabetes mellitus without complication ([place])   . Diabetes mellitus, type 2 ([place]) 02/25/2013  . Essential hypertension 02/23/2013  . History of bladder cancer   . History of kidney stones   . History of prostate cancer 04/04/2013  . History of TIA (transient ischemic attack)   . Hyperlipidemia 02/23/2013  . Hypertension   . Murmur 02/23/2013   Overview:  ECHO done by Dr. Earnest Rosier of this note might be different from the original. ECHO done by Dr. Bettina Gavia  . Non-rheumatic mitral regurgitation 04/09/2015   Overview:  Mild  Formatting of this note might be different from the original. Mild  . Nonrheumatic aortic valve insufficiency 04/09/2015   Overview:  mild  Formatting of this note might be different from the original. mild  . On amiodarone therapy 06/20/2019  . Onychomycosis due to dermatophyte 03/15/2020  . PAF (paroxysmal atrial fibrillation) (HCC)    on Eliquis  . Paroxysmal A-fib ([place]) 08/31/2017  . PVC (premature ventricular contraction) 06/06/2015  . 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 Argentina Ponder  . Severe aortic stenosis    . Transaminitis     Past Surgical History:  Procedure Laterality Date  . CARDIAC CATHETERIZATION    . HERNIA REPAIR    . JOINT REPLACEMENT    . PROSTATE SURGERY    . RIGHT/LEFT HEART CATH AND CORONARY ANGIOGRAPHY N/A 09/03/2017   Procedure: RIGHT/LEFT HEART CATH AND CORONARY ANGIOGRAPHY;  Surgeon: Jolaine Artist, MD;  Location: [facility];  Service: Cardiovascular;  Laterality: N/A;  . RIGHT/LEFT HEART CATH AND CORONARY ANGIOGRAPHY N/A 10/19/2019   Procedure: RIGHT/LEFT HEART CATH AND CORONARY ANGIOGRAPHY;  Surgeon: Wellington Hampshire, MD;  Location: [facility];  Service: Cardiovascular;  Laterality: N/A;  . TEE WITHOUT CARDIOVERSION N/A 11/15/2019   Procedure: TRANSESOPHAGEAL ECHOCARDIOGRAM (TEE);  Surgeon: Sherren Mocha, MD;  Location: [facility];  Service: Open Heart Surgery;  Laterality: N/A;    Current Medications: Current Meds  Medication Sig  . acetaminophen (TYLENOL 8 HOUR ARTHRITIS PAIN) 650 MG CR tablet Take 650 mg by mouth at bedtime.   Marland Kitchen acetaminophen (TYLENOL) 500 MG tablet Take 500 mg by mouth every 6 (six) hours as needed for  mild pain (out of arthritis).  Marland Kitchen amiodarone (PACERONE) 200 MG tablet TAKE 1 TABLET(200 MG) BY MOUTH DAILY  . apixaban (ELIQUIS) 2.5 MG TABS tablet Take 1 tablet (2.5 mg total) by mouth 2 (two) times daily.  Marland Kitchen ezetimibe (ZETIA) 10 MG tablet TAKE 1 TABLET(10 MG) BY MOUTH DAILY  . furosemide (LASIX) 80 MG tablet Take 80 mg by mouth daily.  Marland Kitchen losartan (COZAAR) 25 MG tablet TAKE 0.5 TABLET BY MOUTH DAILY  . lovastatin (MEVACOR) 40 MG tablet Take 40 mg by mouth at bedtime.  . nitroGLYCERIN (NITROSTAT) 0.4 MG SL tablet DISSOLVE 1 TABLET UNDER THE TONGUE EVERY 5 MINUTES AS NEEDED FOR CHEST PAIN  . traMADol (ULTRAM) 50 MG tablet Take 50 mg by mouth every 4 (four) hours as needed.  . triamcinolone cream (KENALOG) 0.1 % Apply 1 application topically 2 (two) times daily as needed (itching).      Allergies:   Spironolactone   Social History     Socioeconomic History  . Marital status: Married    Spouse name: Not on file  . Number of children: Not on file  . Years of education: Not on file  . Highest education level: Not on file  Occupational History  . Not on file  Tobacco Use  . Smoking status: Never Smoker  . Smokeless tobacco: Never Used  Vaping Use  . Vaping Use: Never used  Substance and Sexual Activity  . Alcohol use: No  . Drug use: No  . Sexual activity: Not on file  Other Topics Concern  . Not on file  Social History Narrative  . Not on file   Social Determinants of Health   Financial Resource Strain: Low Risk   . Difficulty of Paying Living Expenses: Not hard at all  Food Insecurity: No Food Insecurity  . Worried About Charity fundraiser in the Last Year: Never true  . Ran Out of Food in the Last Year: Never true  Transportation Needs: No Transportation Needs  . Lack of Transportation (Medical): No  . Lack of Transportation (Non-Medical): No  Physical Activity:   . Days of Exercise per Week: Not on file  . Minutes of Exercise per Session: Not on file  Stress:   . Feeling of Stress : Not on file  Social Connections:   . Frequency of Communication with Friends and Family: Not on file  . Frequency of Social Gatherings with Friends and Family: Not on file  . Attends Religious Services: Not on file  . Active Member of Clubs or Organizations: Not on file  . Attends Archivist Meetings: Not on file  . Marital Status: Not on file     Family History: The patient's family history includes Heart Problems in his brother; Heart attack in his father. ROS:   Please see the history of present illness.    All other systems reviewed and are negative.  EKGs/Labs/Other Studies Reviewed:    The following studies were reviewed today:  EKG:  EKG ordered today and personally reviewed.  The ekg ordered today demonstrates sinus rhythm first-degree AV block left bundle branch block  Recent  Labs: 11/11/2019: B Natriuretic Peptide 91.0 11/16/2019: Magnesium 1.9 11/28/2019: Hemoglobin 15.8; NT-Pro BNP 383; Platelets 192 02/21/2020: ALT 38; BUN 25; Creatinine, Ser 1.48; Potassium 4.8; Sodium 140; TSH 1.360  Recent Lipid Panel    Component Value Date/Time   CHOL 193 06/20/2019 1247   TRIG 124 06/20/2019 1247   HDL 51 06/20/2019 1247   CHOLHDL  3.8 06/20/2019 1247   LDLCALC 120 (H) 06/20/2019 1247    Physical Exam:    VS:  BP (!) 144/64   Pulse 60   Ht 5\' 8"  (1.727 m)   Wt 157 lb (71.2 kg)   SpO2 96%   BMI 23.87 kg/m     Wt Readings from Last 3 Encounters:  06/04/20 157 lb (71.2 kg)  02/21/20 160 lb 6.4 oz (72.8 kg)  12/15/19 161 lb 9.6 oz (73.3 kg)     GEN:  Well nourished, well developed in no acute distress HEENT: Normal NECK: No JVD; No carotid bruits LYMPHATICS: No lymphadenopathy CARDIAC: RRR, no murmurs, rubs, gallops RESPIRATORY:  Clear to auscultation without rales, wheezing or rhonchi  ABDOMEN: Soft, non-tender, non-distended MUSCULOSKELETAL:  No edema; No deformity  SKIN: Warm and dry NEUROLOGIC:  Alert and oriented x 3 PSYCHIATRIC:  Normal affect    Signed, Shirlee More, MD  06/04/2020 1:12 PM    [facility]

## 2020-06-04 ENCOUNTER — Encounter: Payer: Self-pay | Admitting: Cardiology

## 2020-06-04 ENCOUNTER — Ambulatory Visit: Payer: Medicare Other | Admitting: Cardiology

## 2020-06-04 ENCOUNTER — Other Ambulatory Visit: Payer: Self-pay

## 2020-06-04 VITALS — BP 144/64 | HR 60 | Ht 68.0 in | Wt 157.0 lb

## 2020-06-04 DIAGNOSIS — R531 Weakness: Secondary | ICD-10-CM

## 2020-06-04 DIAGNOSIS — Z7901 Long term (current) use of anticoagulants: Secondary | ICD-10-CM | POA: Diagnosis not present

## 2020-06-04 DIAGNOSIS — Z952 Presence of prosthetic heart valve: Secondary | ICD-10-CM

## 2020-06-04 DIAGNOSIS — E782 Mixed hyperlipidemia: Secondary | ICD-10-CM

## 2020-06-04 DIAGNOSIS — Z79899 Other long term (current) drug therapy: Secondary | ICD-10-CM

## 2020-06-04 DIAGNOSIS — I480 Paroxysmal atrial fibrillation: Secondary | ICD-10-CM

## 2020-06-04 DIAGNOSIS — I110 Hypertensive heart disease with heart failure: Secondary | ICD-10-CM

## 2020-06-04 DIAGNOSIS — I5042 Chronic combined systolic (congestive) and diastolic (congestive) heart failure: Secondary | ICD-10-CM

## 2020-06-04 DIAGNOSIS — N1832 Chronic kidney disease, stage 3b: Secondary | ICD-10-CM

## 2020-06-04 DIAGNOSIS — Z789 Other specified health status: Secondary | ICD-10-CM

## 2020-06-04 NOTE — Patient Instructions (Addendum)
Medication Instructions:  Your physician recommends that you continue on your current medications as directed. Please refer to the Current Medication list given to you today.  *If you need a refill on your cardiac medications before your next appointment, please call your pharmacy*   Lab Work: None ordered  If you have labs (blood work) drawn today and your tests are completely normal, you will receive your results only by: . MyChart Message (if you have MyChart) OR . A paper copy in the mail If you have any lab test that is abnormal or we need to change your treatment, we will call you to review the results.   Testing/Procedures: None ordered   Follow-Up: At [facility], you and your health needs are our priority.  As part of our continuing mission to provide you with exceptional heart care, we have created designated Provider Care Teams.  These Care Teams include your primary Cardiologist (physician) and Advanced Practice Providers (APPs -  Physician Assistants and Nurse Practitioners) who all work together to provide you with the care you need, when you need it.  We recommend signing up for the patient portal called "MyChart".  Sign up information is provided on this After Visit Summary.  MyChart is used to connect with patients for Virtual Visits (Telemedicine).  Patients are able to view lab/test results, encounter notes, upcoming appointments, etc.  Non-urgent messages can be sent to your provider as well.   To learn more about what you can do with MyChart, go to [email or web address].    Your next appointment:   6 month(s)  The format for your next appointment:   In Person  Provider:   Brian Munley, MD   Other Instructions  

## 2020-06-05 ENCOUNTER — Other Ambulatory Visit: Payer: Self-pay | Admitting: *Deleted

## 2020-06-05 NOTE — Patient Outreach (Signed)
[place] [name]) Care Management  06/05/2020  JAMYSON JIRAK 1931-04-26 [id number]   RN Health Coach Monthly Outreach  Referral Date:11/01/2018 Referral Source:MD Office Reason for Referral:Disease Management Education Insurance:Blue Cross Blue Shield Medicare   Outreach Attempt:  Outreach attempt #1 to patient for follow up. No answer. RN Health Coach left HIPAA compliant voicemail message along with contact information.  Plan:  RN Health Coach will make another outreach attempt within the month of October if no return call back from patient.   [place] [phone] [email or web address]

## 2020-06-15 DIAGNOSIS — E782 Mixed hyperlipidemia: Secondary | ICD-10-CM | POA: Diagnosis not present

## 2020-06-15 DIAGNOSIS — I5020 Unspecified systolic (congestive) heart failure: Secondary | ICD-10-CM | POA: Diagnosis not present

## 2020-06-15 DIAGNOSIS — E1169 Type 2 diabetes mellitus with other specified complication: Secondary | ICD-10-CM | POA: Diagnosis not present

## 2020-06-20 ENCOUNTER — Other Ambulatory Visit: Payer: Self-pay

## 2020-06-20 ENCOUNTER — Ambulatory Visit (INDEPENDENT_AMBULATORY_CARE_PROVIDER_SITE_OTHER): Payer: Medicare Other

## 2020-06-20 DIAGNOSIS — Z952 Presence of prosthetic heart valve: Secondary | ICD-10-CM | POA: Diagnosis not present

## 2020-06-20 LAB — ECHOCARDIOGRAM COMPLETE
AR max vel: 0.82 cm2
AV Area VTI: 0.81 cm2
AV Area mean vel: 0.8 cm2
AV Mean grad: 16 mmHg
AV Peak grad: 26.4 mmHg
Ao pk vel: 2.57 m/s
Area-P 1/2: 2.87 cm2
Calc EF: 36.9 %
P 1/2 time: 567 msec
S' Lateral: 4.6 cm
Single Plane A2C EF: 31.6 %
Single Plane A4C EF: 38.3 %

## 2020-06-20 NOTE — Progress Notes (Signed)
Complete echocardiogram has been performed.  Jimmy Suzy Kugel RDCS, RVT 

## 2020-06-21 ENCOUNTER — Telehealth: Payer: Self-pay

## 2020-06-21 DIAGNOSIS — Z952 Presence of prosthetic heart valve: Secondary | ICD-10-CM

## 2020-06-21 NOTE — Telephone Encounter (Signed)
-----   Message from Nuala Alpha, LPN sent at 16/09/958  9:46 AM EDT -----  ----- Message ----- From: Eileen Stanford, PA-C Sent: 06/21/2020   8:51 AM EDT To: [facility] Triage  6 month follow up echo shows stable EF and TAVR valve function with moderate perivalvular leak. Will see him back for 1 year follow per protocol.

## 2020-06-21 NOTE — Telephone Encounter (Signed)
Reviewed results with patient who verbalized understanding.   1 year TAVR appointments made in March 2022. The patient was grateful for call.

## 2020-06-26 DIAGNOSIS — E119 Type 2 diabetes mellitus without complications: Secondary | ICD-10-CM | POA: Diagnosis not present

## 2020-06-26 DIAGNOSIS — H40003 Preglaucoma, unspecified, bilateral: Secondary | ICD-10-CM | POA: Diagnosis not present

## 2020-07-09 ENCOUNTER — Other Ambulatory Visit: Payer: Self-pay | Admitting: *Deleted

## 2020-07-09 ENCOUNTER — Encounter: Payer: Self-pay | Admitting: *Deleted

## 2020-07-09 DIAGNOSIS — C672 Malignant neoplasm of lateral wall of bladder: Secondary | ICD-10-CM | POA: Diagnosis not present

## 2020-07-09 NOTE — Patient Outreach (Signed)
[facility]) Care Management  [place]  07/09/2020   MIGEL HANNIS 06-Jun-1931 [id number]   [place] Other Month Outreach  Referral Date:11/01/2018 Referral Source:MD Office Reason for Referral:Disease Management Education Insurance:[employer] Medicare   Outreach Attempt:   Successful telephone outreach to patient for follow up.  HIPAA verified with patient.  Patient reporting he is "doing about the same".  Denies any more falls since his hypoglycemic episodes in June/July.  Continues to monitor blood sugars about 3-4 times a week.  Has not checked today.  Fasting ranges have been in the 120's.  Latest Hgb A1C is down to 6.6.  Denies any recent hypoglycemic episodes.  Reports not weighing daily.  Discussed importance of daily weight monitoring.  States weight has ranged 150-160's lately.  Denies any lower extremity edema and states shortness of breath is about the same.  Encounter Medications:  Outpatient Encounter Medications as of 07/09/2020  Medication Sig   acetaminophen (TYLENOL 8 HOUR ARTHRITIS PAIN) 650 MG CR tablet Take 650 mg by mouth at bedtime.    amiodarone (PACERONE) 200 MG tablet TAKE 1 TABLET(200 MG) BY MOUTH DAILY   apixaban (ELIQUIS) 2.5 MG TABS tablet Take 1 tablet (2.5 mg total) by mouth 2 (two) times daily.   ezetimibe (ZETIA) 10 MG tablet TAKE 1 TABLET(10 MG) BY MOUTH DAILY   furosemide (LASIX) 80 MG tablet Take 80 mg by mouth daily.   losartan (COZAAR) 25 MG tablet TAKE 0.5 TABLET BY MOUTH DAILY   lovastatin (MEVACOR) 40 MG tablet Take 40 mg by mouth at bedtime.   traMADol (ULTRAM) 50 MG tablet Take 50 mg by mouth every 4 (four) hours as needed.   acetaminophen (TYLENOL) 500 MG tablet Take 500 mg by mouth every 6 (six) hours as needed for mild pain (out of arthritis).   nitroGLYCERIN (NITROSTAT) 0.4 MG SL tablet DISSOLVE 1 TABLET UNDER THE TONGUE EVERY 5 MINUTES AS NEEDED FOR CHEST PAIN    triamcinolone cream (KENALOG) 0.1 % Apply 1 application topically 2 (two) times daily as needed (itching).    No facility-administered encounter medications on file as of 07/09/2020.    Functional Status:  In your present state of health, do you have any difficulty performing the following activities: 11/16/2019 11/11/2019  Hearing? Y Y  Comment - -  Vision? N N  Difficulty concentrating or making decisions? N N  Walking or climbing stairs? Y Y  Dressing or bathing? N N  Doing errands, shopping? N N  Preparing Food and eating ? - -  Using the Toilet? - -  In the past six months, have you accidently leaked urine? - -  Do you have problems with loss of bowel control? - -  Managing your Medications? - -  Managing your Finances? - -  Housekeeping or managing your Housekeeping? - -  Some recent data might be hidden    Fall/Depression Screening: Fall Risk  07/09/2020 03/28/2020 01/25/2020  Falls in the past year? 1 1 0  Number falls in past yr: 1 1 0  Injury with Fall? 0 0 0  Risk for fall due to : History of fall(s);Impaired balance/gait;Impaired mobility;Impaired vision;Medication side effect History of fall(s);Impaired balance/gait;Impaired mobility;Impaired vision;Medication side effect Impaired balance/gait;Impaired mobility;Impaired vision;Medication side effect  Follow up Falls evaluation completed;Education provided;Falls prevention discussed Falls evaluation completed;Education provided;Falls prevention discussed Falls evaluation completed;Education provided;Falls prevention discussed   PHQ 2/9 Scores 12/19/2019 12/10/2018 11/01/2018  PHQ - 2 Score 0 0 0  Goals Addressed              This Visit's Progress     [facility]) Learn More About My Health        Follow Up Date 10/14/20   - tell my story and reason for my visit - make a list of questions - ask questions - repeat what I heard to make sure I understand - bring a list of my medicines to the visit - speak up when I don't  understand    Why is this important?   The best way to learn about your health and care is by talking to the doctor and nurse.  They will answer your questions and give you information in the way that you like best.    Notes:       [facility]) Make and Keep All Appointments        Follow Up Date 10/14/20   - ask family or friend for a ride - call to cancel if needed - keep a calendar with appointment dates    Why is this important?   Part of staying healthy is seeing the doctor for follow-up care.  If you forget your appointments, there are some things you can do to stay on track.    Notes:       [facility]) Monitor and Manage My Blood Sugar        Follow Up Date 01/11/21   - check blood sugar at prescribed times - enter blood sugar readings and medication or insulin into daily log - take the blood sugar log to all doctor visits - take the blood sugar meter to all doctor visits    Why is this important?   Checking your blood sugar at home helps to keep it from getting very high or very low.  Writing the results in a diary or log helps the doctor know how to care for you.  Your blood sugar log should have the time, date and the results.  Also, write down the amount of insulin or other medicine that you take.  Other information, like what you ate, exercise done and how you were feeling, will also be helpful.     Notes:       COMPLETED: [facility]) Patient will report maintaining Hgb A1C of 7 or below within the next 90 days. (pt-stated)        CARE PLAN ENTRY (see longtitudinal plan of care for additional care plan information)  Objective:  Lab Results  Component Value Date   HGBA1C 6.9 (H) 11/11/2019    Lab Results  Component Value Date   CREATININE 1.48 (H) 02/21/2020   CREATININE 1.05 12/15/2019   CREATININE 1.49 (H) 11/28/2019     No results found for: EGFR  Current Barriers:   Knowledge Deficits related to basic Diabetes pathophysiology and self  care/management  Knowledge Deficits related to medications used for management of diabetes  Case Manager Clinical Goal(s):  Over the next 90 days, patient will demonstrate improved adherence to prescribed treatment plan for diabetes self care/management as evidenced by: maintaining Hgb A1C of 7 or below  Verbalize daily monitoring and recording of CBG within 30 days  Verbalize adherence to ADA/ carb modified diet within the next 30 days  Verbalize adherence to prescribed medication regimen within the next 30 days   Verbalize monitoring blood sugars 3 times a day and recording the readings for provider review in the next week to help with hypoglycemia treatment  Interventions:  Provided education to patient about basic DM disease process  Reviewed medications with patient and discussed importance of medication adherence  Discussed plans with patient for ongoing care management follow up and provided patient with direct contact information for care management team  Provided patient with written educational materials related to hypo and hyperglycemia and importance of correct treatment  Reviewed scheduled/upcoming provider appointments including: follow up appointment next Thursday with primary care provider to follow up hypoglycemia  Advised patient, providing education and rationale, to check cbg 2 times a day and record, calling primary care provider for findings outside established parameters.    Patient Self Care Activities:   Self administers oral medications as prescribed  Self administers insulin as prescribed  Attends all scheduled provider appointments  Checks blood sugars as prescribed and utilize hyper and hypoglycemia protocol as needed  Adheres to prescribed ADA/carb modified and eats at least 3 meals a day and bedtime snack  Initial goal documentation   CARE PLAN ENTRY (see longitudinal plan of care for additional care plan information)  Current Barriers:    Knowledge Deficits related to fall precautions  Decreased adherence to prescribed treatment for fall prevention  Clinical Goal(s):   Over the next 30 days, patient will demonstrate improved adherence to prescribed treatment plan for decreasing falls as evidenced by patient reporting and review of EMR  Over the next 30 days, patient will verbalize using fall risk reduction strategies discussed  Over the next 30 days, patient will not experience additional falls    Interventions:   Provided verbal education re: Potential causes of falls and Fall prevention strategies  Reviewed medications and discussed potential side effects of medications such as dizziness and frequent urination  Assessed for s/s of orthostatic hypotension  Assessed for falls since last encounter.  Assessed patients knowledge of fall risk prevention secondary to previously provided education.  Advised patient to use walker with all ambulation and to change positions slowly  Provided education to patient re: fall precautions and preventions reviewed and discussed  Patient Self Care Activities:   Utilize walker (assistive device) appropriately with all ambulation  De-clutter walkways  Change positions slowly  Wear secure fitting shoes at all times with ambulation  Utilize home lighting for dim lit areas  Have self and pet awareness at all times   Progressing     Resolving due to duplicate goals      [facility]) Set My Target A1C        Follow Up Date 10/14/20   - set target A1C; target is 7; current 6.6    Why is this important?   Your target A1C is decided together by you and your doctor.  It is based on several things like your age and other health issues.    Notes:       [facility]) Track and Manage Symptoms        Follow Up Date 01/11/21   - develop a rescue plan - eat more whole grains, fruits and vegetables, lean meats and healthy fats - follow rescue plan if symptoms flare-up - know when  to call the doctor - track symptoms and what helps feel better or worse -weigh daily    Why is this important?   You will be able to handle your symptoms better if you keep track of them.  Making some simple changes to your lifestyle will help.  Eating healthy is one thing you can do to take good care of yourself.    Notes:  Appointments:  Attended appointment with primary care provider, Dr. Nyra Capes on 05/03/2020 and has scheduled follow up on 08/03/2020.  Attended appointment with Cardiology on 06/04/20.  Plan: RN Health Coach will send primary care provider quarterly update. RN Health Coach will make next telephone outreach to patient within the month of January and patient agrees to future outreach.  [place] [phone] [email or web address]

## 2020-07-09 NOTE — Patient Instructions (Signed)
Goals Addressed              This Visit's Progress   .  [facility]) Learn More About My Health        Follow Up Date 10/14/20   - tell my story and reason for my visit - make a list of questions - ask questions - repeat what I heard to make sure I understand - bring a list of my medicines to the visit - speak up when I don't understand    Why is this important?   The best way to learn about your health and care is by talking to the doctor and nurse.  They will answer your questions and give you information in the way that you like best.    Notes:     .  [facility]) Make and Keep All Appointments        Follow Up Date 10/14/20   - ask family or friend for a ride - call to cancel if needed - keep a calendar with appointment dates    Why is this important?   Part of staying healthy is seeing the doctor for follow-up care.  If you forget your appointments, there are some things you can do to stay on track.    Notes:     .  [facility]) Monitor and Manage My Blood Sugar        Follow Up Date 01/11/21   - check blood sugar at prescribed times - enter blood sugar readings and medication or insulin into daily log - take the blood sugar log to all doctor visits - take the blood sugar meter to all doctor visits    Why is this important?   Checking your blood sugar at home helps to keep it from getting very high or very low.  Writing the results in a diary or log helps the doctor know how to care for you.  Your blood sugar log should have the time, date and the results.  Also, write down the amount of insulin or other medicine that you take.  Other information, like what you ate, exercise done and how you were feeling, will also be helpful.     Notes:     .  COMPLETED: [facility]) Patient will report maintaining Hgb A1C of 7 or below within the next 90 days. (pt-stated)        CARE PLAN ENTRY (see longtitudinal plan of care for additional care plan information)  Objective:  Lab Results  Component  Value Date   HGBA1C 6.9 (H) 11/11/2019 .   Lab Results  Component Value Date   CREATININE 1.48 (H) 02/21/2020   CREATININE 1.05 12/15/2019   CREATININE 1.49 (H) 11/28/2019 .   Marland Kitchen No results found for: EGFR  Current Barriers:  Marland Kitchen Knowledge Deficits related to basic Diabetes pathophysiology and self care/management . Knowledge Deficits related to medications used for management of diabetes  Case Manager Clinical Goal(s):  Over the next 90 days, patient will demonstrate improved adherence to prescribed treatment plan for diabetes self care/management as evidenced by: maintaining Hgb A1C of 7 or below . Verbalize daily monitoring and recording of CBG within 30 days . Verbalize adherence to ADA/ carb modified diet within the next 30 days . Verbalize adherence to prescribed medication regimen within the next 30 days  . Verbalize monitoring blood sugars 3 times a day and recording the readings for provider review in the next week to help with hypoglycemia treatment  Interventions:  .  Provided education to patient about basic DM disease process . Reviewed medications with patient and discussed importance of medication adherence . Discussed plans with patient for ongoing care management follow up and provided patient with direct contact information for care management team . Provided patient with written educational materials related to hypo and hyperglycemia and importance of correct treatment . Reviewed scheduled/upcoming provider appointments including: follow up appointment next Thursday with primary care provider to follow up hypoglycemia . Advised patient, providing education and rationale, to check cbg 2 times a day and record, calling primary care provider for findings outside established parameters.    Patient Self Care Activities:  . Self administers oral medications as prescribed . Self administers insulin as prescribed . Attends all scheduled provider appointments . Checks blood  sugars as prescribed and utilize hyper and hypoglycemia protocol as needed . Adheres to prescribed ADA/carb modified and eats at least 3 meals a day and bedtime snack  Initial goal documentation   CARE PLAN ENTRY (see longitudinal plan of care for additional care plan information)  Current Barriers:  Marland Kitchen Knowledge Deficits related to fall precautions . Decreased adherence to prescribed treatment for fall prevention  Clinical Goal(s):  Marland Kitchen Over the next 30 days, patient will demonstrate improved adherence to prescribed treatment plan for decreasing falls as evidenced by patient reporting and review of EMR . Over the next 30 days, patient will verbalize using fall risk reduction strategies discussed . Over the next 30 days, patient will not experience additional falls .   Interventions:  . Provided verbal education re: Potential causes of falls and Fall prevention strategies . Reviewed medications and discussed potential side effects of medications such as dizziness and frequent urination . Assessed for s/s of orthostatic hypotension . Assessed for falls since last encounter. . Assessed patients knowledge of fall risk prevention secondary to previously provided education. . Advised patient to use walker with all ambulation and to change positions slowly . Provided education to patient re: fall precautions and preventions reviewed and discussed  Patient Self Care Activities:  . Utilize walker (assistive device) appropriately with all ambulation . De-clutter walkways . Change positions slowly . Wear secure fitting shoes at all times with ambulation . Utilize home lighting for dim lit areas . Have self and pet awareness at all times   Progressing     Resolving due to duplicate goals    .  [facility]) Set My Target A1C        Follow Up Date 10/14/20   - set target A1C; target is 7; current 6.6    Why is this important?   Your target A1C is decided together by you and your doctor.  It  is based on several things like your age and other health issues.    Notes:     .  [facility]) Track and Manage Symptoms        Follow Up Date 01/11/21   - develop a rescue plan - eat more whole grains, fruits and vegetables, lean meats and healthy fats - follow rescue plan if symptoms flare-up - know when to call the doctor - track symptoms and what helps feel better or worse -weigh daily    Why is this important?   You will be able to handle your symptoms better if you keep track of them.  Making some simple changes to your lifestyle will help.  Eating healthy is one thing you can do to take good care of yourself.    Notes:

## 2020-07-16 DIAGNOSIS — E1169 Type 2 diabetes mellitus with other specified complication: Secondary | ICD-10-CM | POA: Diagnosis not present

## 2020-07-16 DIAGNOSIS — E782 Mixed hyperlipidemia: Secondary | ICD-10-CM | POA: Diagnosis not present

## 2020-07-16 DIAGNOSIS — I5020 Unspecified systolic (congestive) heart failure: Secondary | ICD-10-CM | POA: Diagnosis not present

## 2020-07-27 DIAGNOSIS — E1169 Type 2 diabetes mellitus with other specified complication: Secondary | ICD-10-CM | POA: Diagnosis not present

## 2020-08-02 DIAGNOSIS — N182 Chronic kidney disease, stage 2 (mild): Secondary | ICD-10-CM | POA: Diagnosis not present

## 2020-08-02 DIAGNOSIS — I129 Hypertensive chronic kidney disease with stage 1 through stage 4 chronic kidney disease, or unspecified chronic kidney disease: Secondary | ICD-10-CM | POA: Diagnosis not present

## 2020-08-02 DIAGNOSIS — Z139 Encounter for screening, unspecified: Secondary | ICD-10-CM | POA: Diagnosis not present

## 2020-08-02 DIAGNOSIS — E1169 Type 2 diabetes mellitus with other specified complication: Secondary | ICD-10-CM | POA: Diagnosis not present

## 2020-08-02 DIAGNOSIS — E782 Mixed hyperlipidemia: Secondary | ICD-10-CM | POA: Diagnosis not present

## 2020-08-07 DIAGNOSIS — Z79899 Other long term (current) drug therapy: Secondary | ICD-10-CM | POA: Diagnosis not present

## 2020-08-09 ENCOUNTER — Other Ambulatory Visit: Payer: Self-pay | Admitting: Cardiology

## 2020-08-13 ENCOUNTER — Other Ambulatory Visit: Payer: Self-pay

## 2020-08-13 ENCOUNTER — Encounter: Payer: Self-pay | Admitting: Cardiology

## 2020-08-13 ENCOUNTER — Telehealth: Payer: Self-pay | Admitting: Cardiology

## 2020-08-13 ENCOUNTER — Ambulatory Visit: Payer: Medicare Other | Admitting: Cardiology

## 2020-08-13 VITALS — BP 136/82 | HR 127 | Ht 68.0 in | Wt 151.0 lb

## 2020-08-13 DIAGNOSIS — I5042 Chronic combined systolic (congestive) and diastolic (congestive) heart failure: Secondary | ICD-10-CM

## 2020-08-13 DIAGNOSIS — I110 Hypertensive heart disease with heart failure: Secondary | ICD-10-CM

## 2020-08-13 DIAGNOSIS — Z79899 Other long term (current) drug therapy: Secondary | ICD-10-CM | POA: Diagnosis not present

## 2020-08-13 DIAGNOSIS — Z7901 Long term (current) use of anticoagulants: Secondary | ICD-10-CM

## 2020-08-13 DIAGNOSIS — R000 Tachycardia, unspecified: Secondary | ICD-10-CM

## 2020-08-13 DIAGNOSIS — I480 Paroxysmal atrial fibrillation: Secondary | ICD-10-CM

## 2020-08-13 MED ORDER — METOPROLOL TARTRATE 25 MG PO TABS: 25.0000 mg | ORAL_TABLET | Freq: Four times a day (QID) | ORAL | 3 refills | Status: AC | PRN

## 2020-08-13 MED ORDER — AMIODARONE HCL 200 MG PO TABS: 400.0000 mg | ORAL_TABLET | Freq: Two times a day (BID) | ORAL | 3 refills | Status: AC

## 2020-08-13 NOTE — Telephone Encounter (Signed)
STAT if HR is under 50 or over 120 (normal HR is 60-100 beats per minute)  1) What is your heart rate? 125  2) Do you have a log of your heart rate readings (document readings)?  125 125 129  3) Do you have any other symptoms? SOB  Pt c/o Shortness Of Breath: STAT if SOB developed within the last 24 hours or pt is noticeably SOB on the phone  1. Are you currently SOB (can you hear that pt is SOB on the phone)? N/A - patient's daughter is not with the patient  2. How long have you been experiencing SOB? Per patient's daughter, SOB developed on 08/12/20  3. Are you SOB when sitting or when up moving around? When up and moving around  4. Are you currently experiencing any other symptoms? No

## 2020-08-13 NOTE — Patient Instructions (Addendum)
Medication Instructions:  Your physician has recommended you make the following change in your medication:  INCREASE: Amiodarone 400 mg per 2 tablets take by mouth twice daily.  START: Lopressor 25 mg take one tablet by mouth every 6 hours as needed if your heart rate is above 100 beats per minute.  *If you need a refill on your cardiac medications before your next appointment, please call your pharmacy*   Lab Work: None If you have labs (blood work) drawn today and your tests are completely normal, you will receive your results only by: Marland Kitchen MyChart Message (if you have MyChart) OR . A paper copy in the mail If you have any lab test that is abnormal or we need to change your treatment, we will call you to review the results.   Testing/Procedures: None   Follow-Up: At [facility], you and your health needs are our priority.  As part of our continuing mission to provide you with exceptional heart care, we have created designated Provider Care Teams.  These Care Teams include your primary Cardiologist (physician) and Advanced Practice Providers (APPs -  Physician Assistants and Nurse Practitioners) who all work together to provide you with the care you need, when you need it.  We recommend signing up for the patient portal called "MyChart".  Sign up information is provided on this After Visit Summary.  MyChart is used to connect with patients for Virtual Visits (Telemedicine).  Patients are able to view lab/test results, encounter notes, upcoming appointments, etc.  Non-urgent messages can be sent to your provider as well.   To learn more about what you can do with MyChart, go to [email or web address].    Your next appointment:   Wednesday at 3 pm  The format for your next appointment:   In Person  Provider:   Shirlee More, MD   Other Instructions

## 2020-08-13 NOTE — Progress Notes (Signed)
Cardiology Office Note:    Date:  08/13/2020   ID:  Bernard Kim, DOB 1931/02/13, MRN [id number]  PCP:  Marco Collie, MD  Cardiologist:  Shirlee More, MD    Referring MD: Marco Collie, MD    ASSESSMENT:    1. Rapid heart rate   2. Paroxysmal A-fib ([place])   3. On amiodarone therapy   4. Chronic anticoagulation   5. Hypertensive heart disease with chronic combined systolic and diastolic congestive heart failure ([place])    PLAN:    In order of problems listed above:  1. EKG most consistent with slow atrial flutter 2-1 conduction increase amiodarone as needed beta-blocker reassess in 48 hours and if persistent will need outpatient cardioversion 2. Continue his anticoagulant 3. Compensated continue his current loop diuretic   Next appointment: Wednesday   Medication Adjustments/Labs and Tests Ordered: Current medicines are reviewed at length with the patient today.  Concerns regarding medicines are outlined above.  Orders Placed This Encounter  Procedures  . EKG 12-Lead   Meds ordered this encounter  Medications  . metoprolol tartrate (LOPRESSOR) 25 MG tablet    Sig: Take 1 tablet (25 mg total) by mouth every 6 (six) hours as needed (Take if your heart rate is greater than 100 beats per minute).    Dispense:  180 tablet    Refill:  3  . amiodarone (PACERONE) 200 MG tablet    Sig: Take 2 tablets (400 mg total) by mouth 2 (two) times daily.    Dispense:  360 tablet    Refill:  3    Chief Complaint  Patient presents with  . Follow-up    Phone call to the office with rapid heart rate and shortness of breath  . Tachycardia  . Shortness of Breath    History of Present Illness:     Bernard Kim is a 86 y.o. male with a hx of aortic stenosis status post TAVR #29 Edwards Sapien 3 valve 12/05/2019, paroxysmal atrial fibrillation suppressed on amiodarone, hypertensive heart disease with heart failure, coronary artery disease hyperlipidemia and left bundle branch block.  He  was last seen 06/04/2020 slowly improving.  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 Compliance with diet, lifestyle and medications: Yes  Is felt weak since yesterday and he is in what appears to be slow atrial flutter with 2-1 conduction.  We discussed alternatives evaluation and cardioversion in the ED or increasing his amiodarone as needed beta-blocker and follow-up Wednesday and he prefers the latter.  He has had no chest pain edema shortness of breath or syncope.  His daughter will stay at the home and check his heart rate and blood pressures Past Medical History:  Diagnosis Date  . Bladder cancer ([place]) 09/22/2013  . CAD in native artery 02/23/2013   Overview:  Stent to [place] of this note might be different from the original. Stent to RCA 1995  . Cancer ([place])    bladdre cancer  . Chronic combined systolic (congestive) and diastolic (congestive) heart failure ([place])   . CKD (chronic kidney disease) stage 3, GFR 30-59 ml/min ([place]) 06/18/2019  . Comprehensive diabetic foot examination, type 2 DM, encounter for ([place]) 03/15/2020  . Coronary artery disease   . Coronary artery disease involving native coronary artery of native heart with angina pectoris ([place]) 04/09/2015   Formatting of this note might be different from  the original. Catheterization 2015 showed a patent stent of the right coronary artery and nonobstructive CAD of LMCA and LAD  . Diabetes mellitus without complication ([place])   . Diabetes mellitus, type 2 ([place]) 02/25/2013  . Essential hypertension 02/23/2013  . History of bladder cancer   . History of kidney stones   . History of prostate cancer 04/04/2013  . History of TIA (transient ischemic attack)   . Hyperlipidemia 02/23/2013  . Hypertension   . Murmur 02/23/2013   Overview:  ECHO done by Dr. Earnest Rosier of this note might be different from the original. ECHO done by  Dr. Bettina Gavia  . Non-rheumatic mitral regurgitation 04/09/2015   Overview:  Mild  Formatting of this note might be different from the original. Mild  . Nonrheumatic aortic valve insufficiency 04/09/2015   Overview:  mild  Formatting of this note might be different from the original. mild  . On amiodarone therapy 06/20/2019  . Onychomycosis due to dermatophyte 03/15/2020  . PAF (paroxysmal atrial fibrillation) ([facility])    on Eliquis  . Paroxysmal A-fib ([place]) 08/31/2017  . PVC (premature ventricular contraction) 06/06/2015  . 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 Argentina Ponder  . Severe aortic stenosis   . Transaminitis     Past Surgical History:  Procedure Laterality Date  . CARDIAC CATHETERIZATION    . HERNIA REPAIR    . JOINT REPLACEMENT    . PROSTATE SURGERY    . RIGHT/LEFT HEART CATH AND CORONARY ANGIOGRAPHY N/A 09/03/2017   Procedure: RIGHT/LEFT HEART CATH AND CORONARY ANGIOGRAPHY;  Surgeon: Jolaine Artist, MD;  Location: [facility];  Service: Cardiovascular;  Laterality: N/A;  . RIGHT/LEFT HEART CATH AND CORONARY ANGIOGRAPHY N/A 10/19/2019   Procedure: RIGHT/LEFT HEART CATH AND CORONARY ANGIOGRAPHY;  Surgeon: Wellington Hampshire, MD;  Location: [facility];  Service: Cardiovascular;  Laterality: N/A;  . TEE WITHOUT CARDIOVERSION N/A 11/15/2019   Procedure: TRANSESOPHAGEAL ECHOCARDIOGRAM (TEE);  Surgeon: Sherren Mocha, MD;  Location: [facility];  Service: Open Heart Surgery;  Laterality: N/A;    Current Medications: Current Meds  Medication Sig  . acetaminophen (TYLENOL 8 HOUR ARTHRITIS PAIN) 650 MG CR tablet Take 650 mg by mouth at bedtime.   Marland Kitchen apixaban (ELIQUIS) 2.5 MG TABS tablet Take 1 tablet (2.5 mg total) by mouth 2 (two) times daily.  Marland Kitchen ezetimibe (ZETIA) 10 MG tablet TAKE 1 TABLET(10 MG) BY MOUTH DAILY  . furosemide (LASIX) 80 MG tablet Take 80 mg by mouth daily.    Marland Kitchen losartan (COZAAR) 25 MG tablet TAKE 0.5 TABLET BY MOUTH DAILY  . lovastatin (MEVACOR) 40 MG tablet Take 40 mg by mouth at bedtime.  . nitroGLYCERIN (NITROSTAT) 0.4 MG SL tablet DISSOLVE 1 TABLET UNDER THE TONGUE EVERY 5 MINUTES AS NEEDED FOR CHEST PAIN  . traMADol (ULTRAM) 50 MG tablet Take 50 mg by mouth every 4 (four) hours as needed.  . [DISCONTINUED] amiodarone (PACERONE) 200 MG tablet TAKE 1 TABLET(200 MG) BY MOUTH DAILY     Allergies:   Spironolactone   Social History   Socioeconomic History  . Marital status: Married    Spouse name: Not on file  . Number of children: Not on file  . Years of education: Not on file  . Highest education level: Not on file  Occupational History  . Not on file  Tobacco Use  . Smoking status: Never  Smoker  . Smokeless tobacco: Never Used  Vaping Use  . Vaping Use: Never used  Substance and Sexual Activity  . Alcohol use: No  . Drug use: No  . Sexual activity: Not on file  Other Topics Concern  . Not on file  Social History Narrative  . Not on file   Social Determinants of Health   Financial Resource Strain: Low Risk   . Difficulty of Paying Living Expenses: Not hard at all  Food Insecurity: No Food Insecurity  . Worried About Charity fundraiser in the Last Year: Never true  . Ran Out of Food in the Last Year: Never true  Transportation Needs: No Transportation Needs  . Lack of Transportation (Medical): No  . Lack of Transportation (Non-Medical): No  Physical Activity:   . Days of Exercise per Week: Not on file  . Minutes of Exercise per Session: Not on file  Stress:   . Feeling of Stress : Not on file  Social Connections:   . Frequency of Communication with Friends and Family: Not on file  . Frequency of Social Gatherings with Friends and Family: Not on file  . Attends Religious Services: Not on file  . Active Member of Clubs or Organizations: Not on file  . Attends Archivist Meetings: Not on file  . Marital  Status: Not on file     Family History: The patient's family history includes Heart Problems in his brother; Heart attack in his father. ROS:   Please see the history of present illness.    All other systems reviewed and are negative.  EKGs/Labs/Other Studies Reviewed:    The following studies were reviewed today:  EKG:  EKG ordered today and personally reviewed.  The ekg ordered today demonstrates left bundle branch block probable 2-1 conduction of slow atrial flutter  Recent Labs: 11/11/2019: B Natriuretic Peptide 91.0 11/16/2019: Magnesium 1.9 11/28/2019: Hemoglobin 15.8; NT-Pro BNP 383; Platelets 192 02/21/2020: ALT 38; BUN 25; Creatinine, Ser 1.48; Potassium 4.8; Sodium 140; TSH 1.360  Recent Lipid Panel    Component Value Date/Time   CHOL 193 06/20/2019 1247   TRIG 124 06/20/2019 1247   HDL 51 06/20/2019 1247   CHOLHDL 3.8 06/20/2019 1247   LDLCALC 120 (H) 06/20/2019 1247    Physical Exam:    VS:  BP 136/82 (BP Location: Right Arm, Patient Position: Sitting)   Pulse (!) 127   Ht 5\' 8"  (1.727 m)   Wt 151 lb (68.5 kg)   SpO2 96%   BMI 22.96 kg/m     Wt Readings from Last 3 Encounters:  08/13/20 151 lb (68.5 kg)  06/04/20 157 lb (71.2 kg)  02/21/20 160 lb 6.4 oz (72.8 kg)     GEN:  Well nourished, well developed in no acute distress HEENT: Normal NECK: No JVD; No carotid bruits LYMPHATICS: No lymphadenopathy CARDIAC: RRR, no murmurs, rubs, gallops RESPIRATORY:  Clear to auscultation without rales, wheezing or rhonchi  ABDOMEN: Soft, non-tender, non-distended MUSCULOSKELETAL:  No edema; No deformity  SKIN: Warm and dry NEUROLOGIC:  Alert and oriented x 3 PSYCHIATRIC:  Normal affect    Signed, Shirlee More, MD  08/13/2020 2:52 PM    [place]

## 2020-08-13 NOTE — Telephone Encounter (Signed)
Spoke to the patient just now and he is going to come in at 2pm to be seen/EKG.

## 2020-08-13 NOTE — Telephone Encounter (Signed)
Double book this afternoon we need to bring to the office do an EKG to see if he is in atrial fibrillation

## 2020-08-13 NOTE — Telephone Encounter (Signed)
Rx request sent to pharmacy.  

## 2020-08-15 ENCOUNTER — Encounter: Payer: Self-pay | Admitting: Cardiology

## 2020-08-15 ENCOUNTER — Ambulatory Visit: Payer: Medicare Other | Admitting: Cardiology

## 2020-08-15 ENCOUNTER — Other Ambulatory Visit: Payer: Self-pay

## 2020-08-15 VITALS — BP 104/68 | HR 74 | Ht 68.0 in | Wt 150.6 lb

## 2020-08-15 DIAGNOSIS — E782 Mixed hyperlipidemia: Secondary | ICD-10-CM | POA: Diagnosis not present

## 2020-08-15 DIAGNOSIS — I480 Paroxysmal atrial fibrillation: Secondary | ICD-10-CM

## 2020-08-15 DIAGNOSIS — I129 Hypertensive chronic kidney disease with stage 1 through stage 4 chronic kidney disease, or unspecified chronic kidney disease: Secondary | ICD-10-CM | POA: Diagnosis not present

## 2020-08-15 DIAGNOSIS — I10 Essential (primary) hypertension: Secondary | ICD-10-CM | POA: Diagnosis not present

## 2020-08-15 DIAGNOSIS — E1169 Type 2 diabetes mellitus with other specified complication: Secondary | ICD-10-CM | POA: Diagnosis not present

## 2020-08-15 DIAGNOSIS — N182 Chronic kidney disease, stage 2 (mild): Secondary | ICD-10-CM | POA: Diagnosis not present

## 2020-08-15 NOTE — Patient Instructions (Signed)
Medication Instructions:  Your physician recommends that you continue on your current medications as directed. Please refer to the Current Medication list given to you today.  *If you need a refill on your cardiac medications before your next appointment, please call your pharmacy*   Lab Work: Your physician recommends that you return for lab work in: TODAY BMP, Mag, CBC If you have labs (blood work) drawn today and your tests are completely normal, you will receive your results only by:  MyChart Message (if you have MyChart) OR  A paper copy in the mail If you have any lab test that is abnormal or we need to change your treatment, we will call you to review the results.   Testing/Procedures:  None       Follow-Up: At [facility], you and your health needs are our priority.  As part of our continuing mission to provide you with exceptional heart care, we have created designated Provider Care Teams.  These Care Teams include your primary Cardiologist (physician) and Advanced Practice Providers (APPs -  Physician Assistants and Nurse Practitioners) who all work together to provide you with the care you need, when you need it.  We recommend signing up for the patient portal called "MyChart".  Sign up information is provided on this After Visit Summary.  MyChart is used to connect with patients for Virtual Visits (Telemedicine).  Patients are able to view lab/test results, encounter notes, upcoming appointments, etc.  Non-urgent messages can be sent to your provider as well.   To learn more about what you can do with MyChart, go to [email or web address].    Your next appointment:   1 month(s)  The format for your next appointment:   In Person  Provider:   Shirlee More, MD   Other Instructions

## 2020-08-15 NOTE — H&P (View-Only) (Signed)
Cardiology Office Note:    Date:  08/15/2020   ID:  TEQUAN REDMON, DOB 1931/08/13, MRN [id number]  PCP:  Marco Collie, MD  Cardiologist:  Shirlee More, MD  Electrophysiologist:  None   Referring MD: Marco Collie, MD   " I am still short of breath"  History of Present Illness:    Bernard Kim is a 86 y.o. male with a hx of aortic stenosis status post TAVR #29 Edwards Sapien 3 valve 12/05/2019, paroxysmal atrial fibrillation on amiodarone, hypertensive heart disease with heart failure, coronary artery disease hyperlipidemia and left bundle branch block. The patient does follow with Dr. Bettina Gavia and was last seen by him on Monday, August 13, 2020 at that time his amiodarone was increased with plans for possible outpatient cardioversion. He is here today he still is short of breath and exertion.  And still in atrial fibrillation.   Past Medical History:  Diagnosis Date  . Bladder cancer ([place]) 09/22/2013  . CAD in native artery 02/23/2013   Overview:  Stent to [place] of this note might be different from the original. Stent to RCA 1995  . Cancer ([place])    bladdre cancer  . Chronic combined systolic (congestive) and diastolic (congestive) heart failure ([place])   . CKD (chronic kidney disease) stage 3, GFR 30-59 ml/min ([place]) 06/18/2019  . Comprehensive diabetic foot examination, type 2 DM, encounter for ([place]) 03/15/2020  . Coronary artery disease   . Coronary artery disease involving native coronary artery of native heart with angina pectoris ([place]) 04/09/2015   Formatting of this note might be different from the original. Catheterization 2015 showed a patent stent of the right coronary artery and nonobstructive CAD of LMCA and LAD  . Diabetes mellitus without complication ([place])   . Diabetes mellitus, type 2 ([place]) 02/25/2013  . Essential hypertension 02/23/2013  . History of bladder cancer   . History of kidney stones   . History of prostate cancer 04/04/2013  . History of TIA  (transient ischemic attack)   . Hyperlipidemia 02/23/2013  . Hypertension   . Murmur 02/23/2013   Overview:  ECHO done by Dr. Earnest Rosier of this note might be different from the original. ECHO done by Dr. Bettina Gavia  . Non-rheumatic mitral regurgitation 04/09/2015   Overview:  Mild  Formatting of this note might be different from the original. Mild  . Nonrheumatic aortic valve insufficiency 04/09/2015   Overview:  mild  Formatting of this note might be different from the original. mild  . On amiodarone therapy 06/20/2019  . Onychomycosis due to dermatophyte 03/15/2020  . PAF (paroxysmal atrial fibrillation) (H[facility])    on Eliquis  . Paroxysmal A-fib ([place]) 08/31/2017  . PVC (premature ventricular contraction) 06/06/2015  . 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 Argentina Ponder  . Severe aortic stenosis   . Transaminitis     Past Surgical History:  Procedure Laterality Date  . CARDIAC CATHETERIZATION    . HERNIA REPAIR    . JOINT REPLACEMENT    . PROSTATE SURGERY    . RIGHT/LEFT HEART CATH AND CORONARY ANGIOGRAPHY N/A 09/03/2017   Procedure: RIGHT/LEFT HEART CATH AND CORONARY ANGIOGRAPHY;  Surgeon: Jolaine Artist, MD;  Location: [facility];  Service: Cardiovascular;  Laterality: N/A;  . RIGHT/LEFT HEART CATH AND CORONARY ANGIOGRAPHY N/A 10/19/2019   Procedure: RIGHT/LEFT HEART CATH AND CORONARY ANGIOGRAPHY;  Surgeon: Wellington Hampshire, MD;  Location: [facility];  Service: Cardiovascular;  Laterality: N/A;  . TEE WITHOUT CARDIOVERSION N/A 11/15/2019   Procedure: TRANSESOPHAGEAL ECHOCARDIOGRAM (TEE);  Surgeon: Sherren Mocha, MD;  Location: [facility];  Service: Open Heart Surgery;  Laterality: N/A;    Current Medications: Current Meds  Medication Sig  . acetaminophen (TYLENOL 8 HOUR ARTHRITIS PAIN) 650 MG CR tablet Take 650 mg by mouth at bedtime.   Marland Kitchen amiodarone (PACERONE)  200 MG tablet Take 2 tablets (400 mg total) by mouth 2 (two) times daily.  Marland Kitchen apixaban (ELIQUIS) 2.5 MG TABS tablet Take 1 tablet (2.5 mg total) by mouth 2 (two) times daily.  Marland Kitchen ezetimibe (ZETIA) 10 MG tablet TAKE 1 TABLET(10 MG) BY MOUTH DAILY  . furosemide (LASIX) 80 MG tablet Take 80 mg by mouth daily.  Marland Kitchen losartan (COZAAR) 25 MG tablet Take 0.5 tablets (12.5 mg total) by mouth daily.  Marland Kitchen lovastatin (MEVACOR) 40 MG tablet Take 40 mg by mouth at bedtime.  . metoprolol tartrate (LOPRESSOR) 25 MG tablet Take 1 tablet (25 mg total) by mouth every 6 (six) hours as needed (Take if your heart rate is greater than 100 beats per minute).  . nitroGLYCERIN (NITROSTAT) 0.4 MG SL tablet DISSOLVE 1 TABLET UNDER THE TONGUE EVERY 5 MINUTES AS NEEDED FOR CHEST PAIN  . traMADol (ULTRAM) 50 MG tablet Take 50 mg by mouth every 4 (four) hours as needed.     Allergies:   Spironolactone   Social History   Socioeconomic History  . Marital status: Married    Spouse name: Not on file  . Number of children: Not on file  . Years of education: Not on file  . Highest education level: Not on file  Occupational History  . Not on file  Tobacco Use  . Smoking status: Never Smoker  . Smokeless tobacco: Never Used  Vaping Use  . Vaping Use: Never used  Substance and Sexual Activity  . Alcohol use: No  . Drug use: No  . Sexual activity: Not on file  Other Topics Concern  . Not on file  Social History Narrative  . Not on file   Social Determinants of Health   Financial Resource Strain: Low Risk   . Difficulty of Paying Living Expenses: Not hard at all  Food Insecurity: No Food Insecurity  . Worried About Charity fundraiser in the Last Year: Never true  . Ran Out of Food in the Last Year: Never true  Transportation Needs: No Transportation Needs  . Lack of Transportation (Medical): No  . Lack of Transportation (Non-Medical): No  Physical Activity:   . Days of Exercise per Week: Not on file  . Minutes of  Exercise per Session: Not on file  Stress:   . Feeling of Stress : Not on file  Social Connections:   . Frequency of Communication with Friends and Family: Not on file  . Frequency of Social Gatherings with Friends and Family: Not on file  . Attends Religious Services: Not on file  . Active Member of Clubs or Organizations: Not on file  . Attends Archivist Meetings: Not on file  . Marital Status: Not on file     Family History: The patient's family history includes Heart Problems in his brother; Heart attack in his father.  ROS:   Review of Systems  Constitution: Negative for decreased appetite, fever and weight gain.  HENT: Negative for congestion, ear discharge, hoarse voice and  sore throat.   Eyes: Negative for discharge, redness, vision loss in right eye and visual halos.  Cardiovascular: Negative for chest pain, dyspnea on exertion, leg swelling, orthopnea and palpitations.  Respiratory: Negative for cough, hemoptysis, shortness of breath and snoring.   Endocrine: Negative for heat intolerance and polyphagia.  Hematologic/Lymphatic: Negative for bleeding problem. Does not bruise/bleed easily.  Skin: Negative for flushing, nail changes, rash and suspicious lesions.  Musculoskeletal: Negative for arthritis, joint pain, muscle cramps, myalgias, neck pain and stiffness.  Gastrointestinal: Negative for abdominal pain, bowel incontinence, diarrhea and excessive appetite.  Genitourinary: Negative for decreased libido, genital sores and incomplete emptying.  Neurological: Negative for brief paralysis, focal weakness, headaches and loss of balance.  Psychiatric/Behavioral: Negative for altered mental status, depression and suicidal ideas.  Allergic/Immunologic: Negative for HIV exposure and persistent infections.    EKGs/Labs/Other Studies Reviewed:    The following studies were reviewed today:   EKG:  The ekg ordered today demonstrates atrial fibrillation heart rate 74  bpm with underlying left bundle branch block compared to prior EKG no significant change per   Recent Labs: 11/11/2019: B Natriuretic Peptide 91.0 11/16/2019: Magnesium 1.9 11/28/2019: Hemoglobin 15.8; NT-Pro BNP 383; Platelets 192 02/21/2020: ALT 38; BUN 25; Creatinine, Ser 1.48; Potassium 4.8; Sodium 140; TSH 1.360  Recent Lipid Panel    Component Value Date/Time   CHOL 193 06/20/2019 1247   TRIG 124 06/20/2019 1247   HDL 51 06/20/2019 1247   CHOLHDL 3.8 06/20/2019 1247   LDLCALC 120 (H) 06/20/2019 1247    Physical Exam:    VS:  BP 104/68   Pulse 74   Ht 5\' 8"  (1.727 m)   Wt 150 lb 9.6 oz (68.3 kg)   SpO2 91%   BMI 22.90 kg/m     Wt Readings from Last 3 Encounters:  08/15/20 150 lb 9.6 oz (68.3 kg)  08/13/20 151 lb (68.5 kg)  06/04/20 157 lb (71.2 kg)     GEN: Well nourished, well developed in no acute distress HEENT: Normal NECK: No JVD; No carotid bruits LYMPHATICS: No lymphadenopathy CARDIAC: S1S2 noted,RRR, no murmurs, rubs, gallops RESPIRATORY:  Clear to auscultation without rales, wheezing or rhonchi  ABDOMEN: Soft, non-tender, non-distended, +bowel sounds, no guarding. EXTREMITIES: No edema, No cyanosis, no clubbing MUSCULOSKELETAL:  No deformity  SKIN: Warm and dry NEUROLOGIC:  Alert and oriented x 3, non-focal PSYCHIATRIC:  Normal affect, good insight  ASSESSMENT:    1. Paroxysmal A-fib ([place])   2. Primary hypertension    PLAN:    The patient is still in atrial fibrillation I spoke with him about moving forward with synchronized DC cardioversion however at this time he tells me that he is not sure if you want to pursue this.  He wants to think about this option with his family and he will call back and let us know.  The patient is in agreement with the above plan. The patient left the office in stable condition.  The patient will follow up in 1 month.   Medication Adjustments/Labs and Tests Ordered: Current medicines are reviewed at length with the  patient today.  Concerns regarding medicines are outlined above.  Orders Placed This Encounter  Procedures  . Basic metabolic panel  . CBC  . Magnesium  . EKG 12-Lead   No orders of the defined types were placed in this encounter.   Patient Instructions  Medication Instructions:  Your physician recommends that you continue on your current medications as directed. Please refer to  the Current Medication list given to you today.  *If you need a refill on your cardiac medications before your next appointment, please call your pharmacy*   Lab Work: Your physician recommends that you return for lab work in: [place], CBC If you have labs (blood work) drawn today and your tests are completely normal, you will receive your results only by: Marland Kitchen MyChart Message (if you have MyChart) OR . A paper copy in the mail If you have any lab test that is abnormal or we need to change your treatment, we will call you to review the results.   Testing/Procedures:  None       Follow-Up: At [facility], you and your health needs are our priority.  As part of our continuing mission to provide you with exceptional heart care, we have created designated Provider Care Teams.  These Care Teams include your primary Cardiologist (physician) and Advanced Practice Providers (APPs -  Physician Assistants and Nurse Practitioners) who all work together to provide you with the care you need, when you need it.  We recommend signing up for the patient portal called "MyChart".  Sign up information is provided on this After Visit Summary.  MyChart is used to connect with patients for Virtual Visits (Telemedicine).  Patients are able to view lab/test results, encounter notes, upcoming appointments, etc.  Non-urgent messages can be sent to your provider as well.   To learn more about what you can do with MyChart, go to [email or web address].    Your next appointment:   1 month(s)  The format for your next  appointment:   In Person  Provider:   Shirlee More, MD   Other Instructions      Adopting a Healthy Lifestyle.  Know what a healthy weight is for you (roughly BMI <25) and aim to maintain this   Aim for 7+ servings of fruits and vegetables daily   65-80+ fluid ounces of water or unsweet tea for healthy kidneys   Limit to max 1 drink of alcohol per day; avoid smoking/tobacco   Limit animal fats in diet for cholesterol and heart health - choose grass fed whenever available   Avoid highly processed foods, and foods high in saturated/trans fats   Aim for low stress - take time to unwind and care for your mental health   Aim for 150 min of moderate intensity exercise weekly for heart health, and weights twice weekly for bone health   Aim for 7-9 hours of sleep daily   When it comes to diets, agreement about the perfect plan isnt easy to find, even among the experts. Experts at the [employer] developed an idea known as the Healthy Eating Plate. Just imagine a plate divided into logical, healthy portions.   The emphasis is on diet quality:   Load up on vegetables and fruits - one-half of your plate: Aim for color and variety, and remember that potatoes dont count.   Go for whole grains - one-quarter of your plate: Whole wheat, barley, wheat berries, quinoa, oats, brown rice, and foods made with them. If you want pasta, go with whole wheat pasta.   Protein power - one-quarter of your plate: Fish, chicken, beans, and nuts are all healthy, versatile protein sources. Limit red meat.   The diet, however, does go beyond the plate, offering a few other suggestions.   Use healthy plant oils, such as olive, canola, soy, corn, sunflower and peanut. Check the labels, and  avoid partially hydrogenated oil, which have unhealthy trans fats.   If youre thirsty, drink water. Coffee and tea are good in moderation, but skip sugary drinks and limit milk and dairy products  to one or two daily servings.   The type of carbohydrate in the diet is more important than the amount. Some sources of carbohydrates, such as vegetables, fruits, whole grains, and beans-are healthier than others.   Finally, stay active  Signed, Berniece Salines, DO  08/15/2020 4:01 PM    [facility]

## 2020-08-15 NOTE — Progress Notes (Signed)
Cardiology Office Note:    Date:  08/15/2020   ID:  Bernard Kim, DOB 1931/02/09, MRN [id number]  PCP:  Marco Collie, MD  Cardiologist:  Shirlee More, MD  Electrophysiologist:  None   Referring MD: Marco Collie, MD   " I am still short of breath"  History of Present Illness:    Bernard Kim is a 86 y.o. male with a hx of aortic stenosis status post TAVR #29 Edwards Sapien 3 valve 12/05/2019, paroxysmal atrial fibrillation on amiodarone, hypertensive heart disease with heart failure, coronary artery disease hyperlipidemia and left bundle branch block. The patient does follow with Dr. Bettina Gavia and was last seen by him on Monday, August 13, 2020 at that time his amiodarone was increased with plans for possible outpatient cardioversion. He is here today he still is short of breath and exertion.  And still in atrial fibrillation.   Past Medical History:  Diagnosis Date  . Bladder cancer ([place]) 09/22/2013  . CAD in native artery 02/23/2013   Overview:  Stent to [place] of this note might be different from the original. Stent to RCA 1995  . Cancer ([place])    bladdre cancer  . Chronic combined systolic (congestive) and diastolic (congestive) heart failure ([place])   . CKD (chronic kidney disease) stage 3, GFR 30-59 ml/min ([place]) 06/18/2019  . Comprehensive diabetic foot examination, type 2 DM, encounter for ([place]) 03/15/2020  . Coronary artery disease   . Coronary artery disease involving native coronary artery of native heart with angina pectoris ([place]) 04/09/2015   Formatting of this note might be different from the original. Catheterization 2015 showed a patent stent of the right coronary artery and nonobstructive CAD of LMCA and LAD  . Diabetes mellitus without complication ([place])   . Diabetes mellitus, type 2 ([place]) 02/25/2013  . Essential hypertension 02/23/2013  . History of bladder cancer   . History of kidney stones   . History of prostate cancer 04/04/2013  . History of TIA  (transient ischemic attack)   . Hyperlipidemia 02/23/2013  . Hypertension   . Murmur 02/23/2013   Overview:  ECHO done by Dr. Earnest Rosier of this note might be different from the original. ECHO done by Dr. Bettina Gavia  . Non-rheumatic mitral regurgitation 04/09/2015   Overview:  Mild  Formatting of this note might be different from the original. Mild  . Nonrheumatic aortic valve insufficiency 04/09/2015   Overview:  mild  Formatting of this note might be different from the original. mild  . On amiodarone therapy 06/20/2019  . Onychomycosis due to dermatophyte 03/15/2020  . PAF (paroxysmal atrial fibrillation) (HCC)    on Eliquis  . Paroxysmal A-fib ([place]) 08/31/2017  . PVC (premature ventricular contraction) 06/06/2015  . 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 Argentina Ponder  . Severe aortic stenosis   . Transaminitis     Past Surgical History:  Procedure Laterality Date  . CARDIAC CATHETERIZATION    . HERNIA REPAIR    . JOINT REPLACEMENT    . PROSTATE SURGERY    . RIGHT/LEFT HEART CATH AND CORONARY ANGIOGRAPHY N/A 09/03/2017   Procedure: RIGHT/LEFT HEART CATH AND CORONARY ANGIOGRAPHY;  Surgeon: Jolaine Artist, MD;  Location: [facility];  Service: Cardiovascular;  Laterality: N/A;  . RIGHT/LEFT HEART CATH AND CORONARY ANGIOGRAPHY N/A 10/19/2019   Procedure: RIGHT/LEFT HEART CATH AND CORONARY ANGIOGRAPHY;  Surgeon: Wellington Hampshire, MD;  Location: [facility];  Service: Cardiovascular;  Laterality: N/A;  . TEE WITHOUT CARDIOVERSION N/A 11/15/2019   Procedure: TRANSESOPHAGEAL ECHOCARDIOGRAM (TEE);  Surgeon: Sherren Mocha, MD;  Location: [facility];  Service: Open Heart Surgery;  Laterality: N/A;    Current Medications: Current Meds  Medication Sig  . acetaminophen (TYLENOL 8 HOUR ARTHRITIS PAIN) 650 MG CR tablet Take 650 mg by mouth at bedtime.   Marland Kitchen amiodarone (PACERONE)  200 MG tablet Take 2 tablets (400 mg total) by mouth 2 (two) times daily.  Marland Kitchen apixaban (ELIQUIS) 2.5 MG TABS tablet Take 1 tablet (2.5 mg total) by mouth 2 (two) times daily.  Marland Kitchen ezetimibe (ZETIA) 10 MG tablet TAKE 1 TABLET(10 MG) BY MOUTH DAILY  . furosemide (LASIX) 80 MG tablet Take 80 mg by mouth daily.  Marland Kitchen losartan (COZAAR) 25 MG tablet Take 0.5 tablets (12.5 mg total) by mouth daily.  Marland Kitchen lovastatin (MEVACOR) 40 MG tablet Take 40 mg by mouth at bedtime.  . metoprolol tartrate (LOPRESSOR) 25 MG tablet Take 1 tablet (25 mg total) by mouth every 6 (six) hours as needed (Take if your heart rate is greater than 100 beats per minute).  . nitroGLYCERIN (NITROSTAT) 0.4 MG SL tablet DISSOLVE 1 TABLET UNDER THE TONGUE EVERY 5 MINUTES AS NEEDED FOR CHEST PAIN  . traMADol (ULTRAM) 50 MG tablet Take 50 mg by mouth every 4 (four) hours as needed.     Allergies:   Spironolactone   Social History   Socioeconomic History  . Marital status: Married    Spouse name: Not on file  . Number of children: Not on file  . Years of education: Not on file  . Highest education level: Not on file  Occupational History  . Not on file  Tobacco Use  . Smoking status: Never Smoker  . Smokeless tobacco: Never Used  Vaping Use  . Vaping Use: Never used  Substance and Sexual Activity  . Alcohol use: No  . Drug use: No  . Sexual activity: Not on file  Other Topics Concern  . Not on file  Social History Narrative  . Not on file   Social Determinants of Health   Financial Resource Strain: Low Risk   . Difficulty of Paying Living Expenses: Not hard at all  Food Insecurity: No Food Insecurity  . Worried About Charity fundraiser in the Last Year: Never true  . Ran Out of Food in the Last Year: Never true  Transportation Needs: No Transportation Needs  . Lack of Transportation (Medical): No  . Lack of Transportation (Non-Medical): No  Physical Activity:   . Days of Exercise per Week: Not on file  . Minutes of  Exercise per Session: Not on file  Stress:   . Feeling of Stress : Not on file  Social Connections:   . Frequency of Communication with Friends and Family: Not on file  . Frequency of Social Gatherings with Friends and Family: Not on file  . Attends Religious Services: Not on file  . Active Member of Clubs or Organizations: Not on file  . Attends Archivist Meetings: Not on file  . Marital Status: Not on file     Family History: The patient's family history includes Heart Problems in his brother; Heart attack in his father.  ROS:   Review of Systems  Constitution: Negative for decreased appetite, fever and weight gain.  HENT: Negative for congestion, ear discharge, hoarse voice and  sore throat.   Eyes: Negative for discharge, redness, vision loss in right eye and visual halos.  Cardiovascular: Negative for chest pain, dyspnea on exertion, leg swelling, orthopnea and palpitations.  Respiratory: Negative for cough, hemoptysis, shortness of breath and snoring.   Endocrine: Negative for heat intolerance and polyphagia.  Hematologic/Lymphatic: Negative for bleeding problem. Does not bruise/bleed easily.  Skin: Negative for flushing, nail changes, rash and suspicious lesions.  Musculoskeletal: Negative for arthritis, joint pain, muscle cramps, myalgias, neck pain and stiffness.  Gastrointestinal: Negative for abdominal pain, bowel incontinence, diarrhea and excessive appetite.  Genitourinary: Negative for decreased libido, genital sores and incomplete emptying.  Neurological: Negative for brief paralysis, focal weakness, headaches and loss of balance.  Psychiatric/Behavioral: Negative for altered mental status, depression and suicidal ideas.  Allergic/Immunologic: Negative for HIV exposure and persistent infections.    EKGs/Labs/Other Studies Reviewed:    The following studies were reviewed today:   EKG:  The ekg ordered today demonstrates atrial fibrillation heart rate 74  bpm with underlying left bundle branch block compared to prior EKG no significant change per   Recent Labs: 11/11/2019: B Natriuretic Peptide 91.0 11/16/2019: Magnesium 1.9 11/28/2019: Hemoglobin 15.8; NT-Pro BNP 383; Platelets 192 02/21/2020: ALT 38; BUN 25; Creatinine, Ser 1.48; Potassium 4.8; Sodium 140; TSH 1.360  Recent Lipid Panel    Component Value Date/Time   CHOL 193 06/20/2019 1247   TRIG 124 06/20/2019 1247   HDL 51 06/20/2019 1247   CHOLHDL 3.8 06/20/2019 1247   LDLCALC 120 (H) 06/20/2019 1247    Physical Exam:    VS:  BP 104/68   Pulse 74   Ht 5\' 8"  (1.727 m)   Wt 150 lb 9.6 oz (68.3 kg)   SpO2 91%   BMI 22.90 kg/m     Wt Readings from Last 3 Encounters:  08/15/20 150 lb 9.6 oz (68.3 kg)  08/13/20 151 lb (68.5 kg)  06/04/20 157 lb (71.2 kg)     GEN: Well nourished, well developed in no acute distress HEENT: Normal NECK: No JVD; No carotid bruits LYMPHATICS: No lymphadenopathy CARDIAC: S1S2 noted,RRR, no murmurs, rubs, gallops RESPIRATORY:  Clear to auscultation without rales, wheezing or rhonchi  ABDOMEN: Soft, non-tender, non-distended, +bowel sounds, no guarding. EXTREMITIES: No edema, No cyanosis, no clubbing MUSCULOSKELETAL:  No deformity  SKIN: Warm and dry NEUROLOGIC:  Alert and oriented x 3, non-focal PSYCHIATRIC:  Normal affect, good insight  ASSESSMENT:    1. Paroxysmal A-fib ([place])   2. Primary hypertension    PLAN:    The patient is still in atrial fibrillation I spoke with him about moving forward with synchronized DC cardioversion however at this time he tells me that he is not sure if you want to pursue this.  He wants to think about this option with his family and he will call back and let us know.  The patient is in agreement with the above plan. The patient left the office in stable condition.  The patient will follow up in 1 month.   Medication Adjustments/Labs and Tests Ordered: Current medicines are reviewed at length with the  patient today.  Concerns regarding medicines are outlined above.  Orders Placed This Encounter  Procedures  . Basic metabolic panel  . CBC  . Magnesium  . EKG 12-Lead   No orders of the defined types were placed in this encounter.   Patient Instructions  Medication Instructions:  Your physician recommends that you continue on your current medications as directed. Please refer to  the Current Medication list given to you today.  *If you need a refill on your cardiac medications before your next appointment, please call your pharmacy*   Lab Work: Your physician recommends that you return for lab work in: [place], CBC If you have labs (blood work) drawn today and your tests are completely normal, you will receive your results only by: Marland Kitchen MyChart Message (if you have MyChart) OR . A paper copy in the mail If you have any lab test that is abnormal or we need to change your treatment, we will call you to review the results.   Testing/Procedures:  None       Follow-Up: At [facility], you and your health needs are our priority.  As part of our continuing mission to provide you with exceptional heart care, we have created designated Provider Care Teams.  These Care Teams include your primary Cardiologist (physician) and Advanced Practice Providers (APPs -  Physician Assistants and Nurse Practitioners) who all work together to provide you with the care you need, when you need it.  We recommend signing up for the patient portal called "MyChart".  Sign up information is provided on this After Visit Summary.  MyChart is used to connect with patients for Virtual Visits (Telemedicine).  Patients are able to view lab/test results, encounter notes, upcoming appointments, etc.  Non-urgent messages can be sent to your provider as well.   To learn more about what you can do with MyChart, go to [email or web address].    Your next appointment:   1 month(s)  The format for your next  appointment:   In Person  Provider:   Shirlee More, MD   Other Instructions      Adopting a Healthy Lifestyle.  Know what a healthy weight is for you (roughly BMI <25) and aim to maintain this   Aim for 7+ servings of fruits and vegetables daily   65-80+ fluid ounces of water or unsweet tea for healthy kidneys   Limit to max 1 drink of alcohol per day; avoid smoking/tobacco   Limit animal fats in diet for cholesterol and heart health - choose grass fed whenever available   Avoid highly processed foods, and foods high in saturated/trans fats   Aim for low stress - take time to unwind and care for your mental health   Aim for 150 min of moderate intensity exercise weekly for heart health, and weights twice weekly for bone health   Aim for 7-9 hours of sleep daily   When it comes to diets, agreement about the perfect plan isnt easy to find, even among the experts. Experts at the [employer] developed an idea known as the Healthy Eating Plate. Just imagine a plate divided into logical, healthy portions.   The emphasis is on diet quality:   Load up on vegetables and fruits - one-half of your plate: Aim for color and variety, and remember that potatoes dont count.   Go for whole grains - one-quarter of your plate: Whole wheat, barley, wheat berries, quinoa, oats, brown rice, and foods made with them. If you want pasta, go with whole wheat pasta.   Protein power - one-quarter of your plate: Fish, chicken, beans, and nuts are all healthy, versatile protein sources. Limit red meat.   The diet, however, does go beyond the plate, offering a few other suggestions.   Use healthy plant oils, such as olive, canola, soy, corn, sunflower and peanut. Check the labels, and  avoid partially hydrogenated oil, which have unhealthy trans fats.   If youre thirsty, drink water. Coffee and tea are good in moderation, but skip sugary drinks and limit milk and dairy products  to one or two daily servings.   The type of carbohydrate in the diet is more important than the amount. Some sources of carbohydrates, such as vegetables, fruits, whole grains, and beans-are healthier than others.   Finally, stay active  Signed, Berniece Salines, DO  08/15/2020 4:01 PM    [facility]

## 2020-08-16 ENCOUNTER — Telehealth: Payer: Self-pay

## 2020-08-16 LAB — CBC
Hematocrit: 46.3 % (ref 37.5–51.0)
Hemoglobin: 15.2 g/dL (ref 13.0–17.7)
MCH: 30.3 pg (ref 26.6–33.0)
MCHC: 32.8 g/dL (ref 31.5–35.7)
MCV: 92 fL (ref 79–97)
Platelets: 140 10*3/uL — ABNORMAL LOW (ref 150–450)
RBC: 5.01 x10E6/uL (ref 4.14–5.80)
RDW: 12.6 % (ref 11.6–15.4)
WBC: 9 10*3/uL (ref 3.4–10.8)

## 2020-08-16 LAB — BASIC METABOLIC PANEL
BUN/Creatinine Ratio: 15 (ref 10–24)
BUN: 27 mg/dL (ref 8–27)
CO2: 23 mmol/L (ref 20–29)
Calcium: 8.7 mg/dL (ref 8.6–10.2)
Chloride: 98 mmol/L (ref 96–106)
Creatinine, Ser: 1.81 mg/dL — ABNORMAL HIGH (ref 0.76–1.27)
GFR calc Af Amer: 38 mL/min/{1.73_m2} — ABNORMAL LOW (ref >59)
GFR calc non Af Amer: 33 mL/min/{1.73_m2} — ABNORMAL LOW (ref >59)
Glucose: 177 mg/dL — ABNORMAL HIGH (ref 65–99)
Potassium: 3.5 mmol/L (ref 3.5–5.2)
Sodium: 139 mmol/L (ref 134–144)

## 2020-08-16 LAB — MAGNESIUM: Magnesium: 1.9 mg/dL (ref 1.6–2.3)

## 2020-08-16 NOTE — Telephone Encounter (Signed)
-----   Message from Richardo Priest, MD sent at 08/16/2020  7:58 AM EST ----- Stable no changes

## 2020-08-16 NOTE — Telephone Encounter (Signed)
Spoke with patient regarding results and recommendation.  Patient verbalizes understanding and is agreeable to plan of care. Advised patient to call back with any issues or concerns.   Patients daughter tells me that the patient has decided that he would like to proceed with a cardioversion as discussed with Dr. Harriet Masson yesterday and on Monday with Dr. Bettina Gavia. I let them know that I would begin filling out this paperwork and would contact [facility] in regards to getting this scheduled.

## 2020-08-17 ENCOUNTER — Other Ambulatory Visit (HOSPITAL_COMMUNITY)
Admission: RE | Admit: 2020-08-17 | Discharge: 2020-08-17 | Disposition: A | Discharge status: Home / Self Care | Payer: Medicare Other | Source: Ambulatory Visit | Attending: Cardiology | Admitting: Cardiology

## 2020-08-17 DIAGNOSIS — Z01812 Encounter for preprocedural laboratory examination: Secondary | ICD-10-CM | POA: Diagnosis not present

## 2020-08-17 DIAGNOSIS — Z20822 Contact with and (suspected) exposure to covid-19: Secondary | ICD-10-CM | POA: Diagnosis not present

## 2020-08-17 LAB — SARS CORONAVIRUS 2 (TAT 6-24 HRS): SARS Coronavirus 2: NEGATIVE

## 2020-08-20 ENCOUNTER — Other Ambulatory Visit: Payer: Self-pay | Admitting: Cardiology

## 2020-08-21 ENCOUNTER — Other Ambulatory Visit: Payer: Self-pay

## 2020-08-21 ENCOUNTER — Encounter (HOSPITAL_COMMUNITY)
Admission: RE | Disposition: A | Discharge status: Home / Self Care | Payer: Self-pay | Source: Home / Self Care | Attending: Cardiology

## 2020-08-21 ENCOUNTER — Encounter (HOSPITAL_COMMUNITY): Payer: Self-pay | Admitting: Cardiology

## 2020-08-21 ENCOUNTER — Ambulatory Visit (HOSPITAL_COMMUNITY): Payer: Medicare Other | Admitting: Anesthesiology

## 2020-08-21 ENCOUNTER — Ambulatory Visit (HOSPITAL_COMMUNITY)
Admission: RE | Admit: 2020-08-21 | Discharge: 2020-08-21 | Disposition: A | Discharge status: Home / Self Care | Payer: Medicare Other | Attending: Cardiology | Admitting: Cardiology

## 2020-08-21 DIAGNOSIS — I350 Nonrheumatic aortic (valve) stenosis: Secondary | ICD-10-CM | POA: Diagnosis not present

## 2020-08-21 DIAGNOSIS — Z79899 Other long term (current) drug therapy: Secondary | ICD-10-CM | POA: Insufficient documentation

## 2020-08-21 DIAGNOSIS — I10 Essential (primary) hypertension: Secondary | ICD-10-CM | POA: Insufficient documentation

## 2020-08-21 DIAGNOSIS — Z7901 Long term (current) use of anticoagulants: Secondary | ICD-10-CM | POA: Insufficient documentation

## 2020-08-21 DIAGNOSIS — E785 Hyperlipidemia, unspecified: Secondary | ICD-10-CM | POA: Diagnosis not present

## 2020-08-21 DIAGNOSIS — I480 Paroxysmal atrial fibrillation: Secondary | ICD-10-CM | POA: Insufficient documentation

## 2020-08-21 DIAGNOSIS — I4891 Unspecified atrial fibrillation: Secondary | ICD-10-CM | POA: Diagnosis not present

## 2020-08-21 DIAGNOSIS — I493 Ventricular premature depolarization: Secondary | ICD-10-CM | POA: Diagnosis not present

## 2020-08-21 HISTORY — PX: CARDIOVERSION: SHX1299

## 2020-08-21 SURGERY — CARDIOVERSION
Anesthesia: General

## 2020-08-21 MED ORDER — PROPOFOL 10 MG/ML IV BOLUS
INTRAVENOUS | Status: DC | PRN
  Administered 2020-08-21: 70 mg via INTRAVENOUS

## 2020-08-21 MED ORDER — LIDOCAINE HCL (CARDIAC) PF 100 MG/5ML IV SOSY
PREFILLED_SYRINGE | INTRAVENOUS | Status: DC | PRN
  Administered 2020-08-21: 60 mg via INTRATRACHEAL

## 2020-08-21 MED ORDER — SODIUM CHLORIDE 0.9 % IV SOLN: INTRAVENOUS | Status: DC | PRN

## 2020-08-21 MED ORDER — PHENYLEPHRINE 40 MCG/ML (10ML) SYRINGE FOR IV PUSH (FOR BLOOD PRESSURE SUPPORT)
PREFILLED_SYRINGE | INTRAVENOUS | Status: DC | PRN
  Administered 2020-08-21: 80 ug via INTRAVENOUS
  Administered 2020-08-21: 120 ug via INTRAVENOUS

## 2020-08-21 NOTE — Anesthesia Postprocedure Evaluation (Signed)
Anesthesia Post Note  Patient: Bernard Kim  Procedure(s) Performed: CARDIOVERSION (N/A )     Patient location during evaluation: Endoscopy Anesthesia Type: General Level of consciousness: awake and alert Pain management: pain level controlled Vital Signs Assessment: post-procedure vital signs reviewed and stable Respiratory status: spontaneous breathing, nonlabored ventilation, respiratory function stable and patient connected to nasal cannula oxygen Cardiovascular status: blood pressure returned to baseline and stable Postop Assessment: no apparent nausea or vomiting Anesthetic complications: no   No complications documented.  Last Vitals:  Vitals:   08/21/20 1144 08/21/20 1154  BP: (!) 93/54 93/78  Pulse: 66 68  Resp: 18 15  Temp:    SpO2: 96% 99%    Last Pain:  Vitals:   08/21/20 1154  TempSrc:   PainSc: 0-No pain   Pain Goal:                   March Rummage Hason Ofarrell

## 2020-08-21 NOTE — Discharge Instructions (Signed)
Electrical Cardioversion Electrical cardioversion is the delivery of a jolt of electricity to restore a normal rhythm to the heart. A rhythm that is too fast or is not regular keeps the heart from pumping well. In this procedure, sticky patches or metal paddles are placed on the chest to deliver electricity to the heart from a device. This procedure may be done in an emergency if:  There is low or no blood pressure as a result of the heart rhythm.  Normal rhythm must be restored as fast as possible to protect the brain and heart from further damage.  It may save a life. This may also be a scheduled procedure for irregular or fast heart rhythms that are not immediately life-threatening. Tell a health care provider about:  Any allergies you have.  All medicines you are taking, including vitamins, herbs, eye drops, creams, and over-the-counter medicines.  Any problems you or family members have had with anesthetic medicines.  Any blood disorders you have.  Any surgeries you have had.  Any medical conditions you have.  Whether you are pregnant or may be pregnant. What are the risks? Generally, this is a safe procedure. However, problems may occur, including:  Allergic reactions to medicines.  A blood clot that breaks free and travels to other parts of your body.  The possible return of an abnormal heart rhythm within hours or days after the procedure.  Your heart stopping (cardiac arrest). This is rare. What happens before the procedure? Medicines  Your health care provider may have you start taking: ? Blood-thinning medicines (anticoagulants) so your blood does not clot as easily. ? Medicines to help stabilize your heart rate and rhythm.  Ask your health care provider about: ? Changing or stopping your regular medicines. This is especially important if you are taking diabetes medicines or blood thinners. ? Taking medicines such as aspirin and ibuprofen. These medicines can  thin your blood. Do not take these medicines unless your health care provider tells you to take them. ? Taking over-the-counter medicines, vitamins, herbs, and supplements. General instructions  Follow instructions from your health care provider about eating or drinking restrictions.  Plan to have someone take you home from the hospital or clinic.  If you will be going home right after the procedure, plan to have someone with you for 24 hours.  Ask your health care provider what steps will be taken to help prevent infection. These may include washing your skin with a germ-killing soap. What happens during the procedure?   An IV will be inserted into one of your veins.  Sticky patches (electrodes) or metal paddles may be placed on your chest.  You will be given a medicine to help you relax (sedative).  An electrical shock will be delivered. The procedure may vary among health care providers and hospitals. What can I expect after the procedure?  Your blood pressure, heart rate, breathing rate, and blood oxygen level will be monitored until you leave the hospital or clinic.  Your heart rhythm will be watched to make sure it does not change.  You may have some redness on the skin where the shocks were given. Follow these instructions at home:  Do not drive for 24 hours if you were given a sedative during your procedure.  Take over-the-counter and prescription medicines only as told by your health care provider.  Ask your health care provider how to check your pulse. Check it often.  Rest for 48 hours after the procedure or   as told by your health care provider.  Avoid or limit your caffeine use as told by your health care provider.  Keep all follow-up visits as told by your health care provider. This is important. Contact a health care provider if:  You feel like your heart is beating too quickly or your pulse is not regular.  You have a serious muscle cramp that does not go  away. Get help right away if:  You have discomfort in your chest.  You are dizzy or you feel faint.  You have trouble breathing or you are short of breath.  Your speech is slurred.  You have trouble moving an arm or leg on one side of your body.  Your fingers or toes turn cold or blue. Summary  Electrical cardioversion is the delivery of a jolt of electricity to restore a normal rhythm to the heart.  This procedure may be done right away in an emergency or may be a scheduled procedure if the condition is not an emergency.  Generally, this is a safe procedure.  After the procedure, check your pulse often as told by your health care provider. This information is not intended to replace advice given to you by your health care provider. Make sure you discuss any questions you have with your health care provider. Document Revised: 04/04/2019 Document Reviewed: 04/04/2019 Elsevier Patient Education  2020 Elsevier Inc[employer]  

## 2020-08-21 NOTE — CV Procedure (Signed)
    Cardioversion Note  CONNIE HILGERT [id number] July 05, 1931  Procedure: DC Cardioversion Indications: atrial fibrillation  Procedure Details Consent: Obtained Time Out: Verified patient identification, verified procedure, site/side was marked, verified correct patient position, special equipment/implants available, Radiology Safety Procedures followed,  medications/allergies/relevent history reviewed, required imaging and test results available.  Performed  The patient has been on adequate anticoagulation.  The patient received IV Lidocaine 70 mg and IV Propofol 80 mg administered by anesthesia staff for deep sedation.  Synchronous cardioversion was performed at 150 joules.  The cardioversion was successful.   Complications: No apparent complications Patient did tolerate procedure well.   Ena Dawley, MD, [facility] 08/21/2020, 11:23 AM

## 2020-08-21 NOTE — Transfer of Care (Signed)
Immediate Anesthesia Transfer of Care Note  Patient: Bernard Kim  Procedure(s) Performed: CARDIOVERSION (N/A )  Patient Location: Endoscopy Unit  Anesthesia Type:General  Level of Consciousness: drowsy  Airway & Oxygen Therapy: Patient Spontanous Breathing  Post-op Assessment: Report given to RN and Post -op Vital signs reviewed and stable  Post vital signs: Reviewed and stable  Last Vitals:  Vitals Value Taken Time  BP 101/47   Temp    Pulse 64   Resp 14   SpO2 99%     Last Pain:  Vitals:   08/21/20 1008  TempSrc: Oral  PainSc: 0-No pain         Complications: No complications documented.

## 2020-08-21 NOTE — Anesthesia Preprocedure Evaluation (Addendum)
Anesthesia Evaluation  Patient identified by MRN, date of birth, ID band Patient awake    Reviewed: Patient's Chart, lab work & pertinent test results, reviewed documented beta blocker date and time   Airway Mallampati: II  TM Distance: >3 FB Neck ROM: Full    Dental  (+) Upper Dentures, Lower Dentures   Pulmonary neg pulmonary ROS,    Pulmonary exam normal        Cardiovascular hypertension, Pt. on medications and Pt. 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 Bowel sounds: normal.  Peds  Hematology negative hematology ROS (+)   Anesthesia Other Findings   Reproductive/Obstetrics                            Anesthesia Physical Anesthesia Plan  ASA: III  Anesthesia Plan: General   Post-op Pain Management:    Induction: Intravenous  PONV Risk Score and Plan: 2  Airway Management Planned: Mask  Additional Equipment: None  Intra-op Plan:   Post-operative Plan:   Informed Consent: I have reviewed the patients History and Physical, chart, labs and discussed the procedure including the risks, benefits and alternatives for the proposed anesthesia with the patient or authorized representative who has indicated his/her understanding and acceptance.     Dental advisory given  Plan Discussed with: CRNA  Anesthesia Plan Comments: (Lab Results      Component                Value               Date                      WBC                      9.0                 08/15/2020                 HGB                      15.2                08/15/2020                HCT                      46.3                08/15/2020                MCV                      92                  08/15/2020                PLT  140 (L)             08/15/2020           ECHO 10/21: 1. Left ventricular ejection fraction, by estimation, is 40 to 45%. The  left ventricle has mildly decreased function. The left ventricle has no  regional wall motion abnormalities. There is moderate left ventricular  hypertrophy. Left ventricular  diastolic parameters are consistent with Grade I diastolic dysfunction  (impaired relaxation).  2. Right ventricular systolic function is normal. The right ventricular  size is normal. There is normal pulmonary artery systolic pressure.  3. Left atrial size was moderately dilated.  4. The mitral valve is normal in structure. Moderate mitral valve  regurgitation. No evidence of mitral stenosis.  5. 45mm Edwards Sapein prosthetic valve (TAVR) with satifactory function.  Moderate paravalvular regurgitation.. Aortic valve regurgitation is not  visualized. No aortic stenosis is present. There is a 29 mm Sapien  prosthetic (TAVR) valve present in the  aortic position.  6. There is mild to moderate dilatation of the ascending aorta, measuring  41 mm.  7. The inferior vena cava is normal in size with greater than 50%  respiratory variability, suggesting right atrial pressure of 3 mmHg. )        Anesthesia Quick Evaluation

## 2020-08-21 NOTE — Interval H&P Note (Signed)
History and Physical Interval Note:  08/21/2020 10:00 AM  Bernard Kim  has presented today for surgery, with the diagnosis of AFIB.  The various methods of treatment have been discussed with the patient and family. After consideration of risks, benefits and other options for treatment, the patient has consented to  Procedure(s): CARDIOVERSION (N/A) as a surgical intervention.  The patient's history has been reviewed, patient examined, no change in status, stable for surgery.  I have reviewed the patient's chart and labs.  Questions were answered to the patient's satisfaction.     Ena Dawley

## 2020-08-22 ENCOUNTER — Encounter (HOSPITAL_COMMUNITY): Payer: Self-pay | Admitting: Cardiology

## 2020-09-15 DIAGNOSIS — E782 Mixed hyperlipidemia: Secondary | ICD-10-CM | POA: Diagnosis not present

## 2020-09-15 DIAGNOSIS — I129 Hypertensive chronic kidney disease with stage 1 through stage 4 chronic kidney disease, or unspecified chronic kidney disease: Secondary | ICD-10-CM | POA: Diagnosis not present

## 2020-09-15 DIAGNOSIS — E1169 Type 2 diabetes mellitus with other specified complication: Secondary | ICD-10-CM | POA: Diagnosis not present

## 2020-09-15 DIAGNOSIS — N182 Chronic kidney disease, stage 2 (mild): Secondary | ICD-10-CM | POA: Diagnosis not present

## 2020-09-18 DIAGNOSIS — E119 Type 2 diabetes mellitus without complications: Secondary | ICD-10-CM | POA: Diagnosis not present

## 2020-09-18 DIAGNOSIS — R0989 Other specified symptoms and signs involving the circulatory and respiratory systems: Secondary | ICD-10-CM | POA: Diagnosis not present

## 2020-09-18 DIAGNOSIS — B351 Tinea unguium: Secondary | ICD-10-CM | POA: Diagnosis not present

## 2020-09-21 ENCOUNTER — Other Ambulatory Visit: Payer: Self-pay | Admitting: *Deleted

## 2020-09-24 DIAGNOSIS — S0093XA Contusion of unspecified part of head, initial encounter: Secondary | ICD-10-CM | POA: Diagnosis not present

## 2020-09-24 DIAGNOSIS — S299XXA Unspecified injury of thorax, initial encounter: Secondary | ICD-10-CM | POA: Diagnosis not present

## 2020-09-24 DIAGNOSIS — S20211A Contusion of right front wall of thorax, initial encounter: Secondary | ICD-10-CM | POA: Diagnosis not present

## 2020-09-24 DIAGNOSIS — S40011A Contusion of right shoulder, initial encounter: Secondary | ICD-10-CM | POA: Diagnosis not present

## 2020-09-24 DIAGNOSIS — S60221A Contusion of right hand, initial encounter: Secondary | ICD-10-CM | POA: Diagnosis not present

## 2020-09-25 ENCOUNTER — Ambulatory Visit: Payer: Medicare Other | Admitting: *Deleted

## 2020-09-26 DIAGNOSIS — W19XXXA Unspecified fall, initial encounter: Secondary | ICD-10-CM | POA: Diagnosis not present

## 2020-09-26 DIAGNOSIS — R296 Repeated falls: Secondary | ICD-10-CM | POA: Diagnosis not present

## 2020-09-26 DIAGNOSIS — R2681 Unsteadiness on feet: Secondary | ICD-10-CM | POA: Diagnosis not present

## 2020-09-26 DIAGNOSIS — Y92009 Unspecified place in unspecified non-institutional (private) residence as the place of occurrence of the external cause: Secondary | ICD-10-CM | POA: Diagnosis not present

## 2020-09-26 NOTE — Patient Outreach (Signed)
[facility]) Care Management  [place]  09/26/2020   Bernard Kim [birth year] [id number]  RN Health Coach telephone call to patient daughter.  [employer] compliance verified. Per [name] the patient has fallen at least 3-4 times within the month.  Per [name] it takes the patient at least 2-3 hrs to complete ADL. Daughter is going to talk with Hospice nurse regarding Hospice, palliative and getting a care giver.Per [name] the patient is not currently on any medications for his diabetes. Last A1C was 6.6 and goal was 7.0. Patient is currently not having any lower extremity swelling with CHF, [name] has agreed to follow up outreach calls.     Encounter Medications:  Outpatient Encounter Medications as of 09/21/2020  Medication Sig  . acetaminophen (TYLENOL) 500 MG tablet Take 500-1,000 mg by mouth every 8 (eight) hours as needed for moderate pain.  Marland Kitchen amiodarone (PACERONE) 200 MG tablet Take 2 tablets (400 mg total) by mouth 2 (two) times daily.  Marland Kitchen apixaban (ELIQUIS) 2.5 MG TABS tablet Take 1 tablet (2.5 mg total) by mouth 2 (two) times daily.  Marland Kitchen ezetimibe (ZETIA) 10 MG tablet TAKE 1 TABLET(10 MG) BY MOUTH DAILY (Patient taking differently: Take 10 mg by mouth daily. )  . furosemide (LASIX) 80 MG tablet Take 80 mg by mouth daily.  Marland Kitchen losartan (COZAAR) 25 MG tablet Take 0.5 tablets (12.5 mg total) by mouth daily.  Marland Kitchen lovastatin (MEVACOR) 40 MG tablet Take 40 mg by mouth at bedtime.  . metoprolol tartrate (LOPRESSOR) 25 MG tablet Take 1 tablet (25 mg total) by mouth every 6 (six) hours as needed (Take if your heart rate is greater than 100 beats per minute).  . nitroGLYCERIN (NITROSTAT) 0.4 MG SL tablet DISSOLVE 1 TABLET UNDER THE TONGUE EVERY 5 MINUTES AS NEEDED FOR CHEST PAIN (Patient taking differently: Place 0.4 mg under the tongue every 5 (five) minutes as needed for chest pain. DISSOLVE 1 TABLET UNDER THE TONGUE EVERY 5 MINUTES AS NEEDED FOR CHEST PAIN.)  . traMADol (ULTRAM) 50 MG  tablet Take 50 mg by mouth every 4 (four) hours as needed for severe pain.   . vitamin B-12 (CYANOCOBALAMIN) 1000 MCG tablet Take 1,000 mcg by mouth daily.   No facility-administered encounter medications on file as of 09/21/2020.    Functional Status:  In your present state of health, do you have any difficulty performing the following activities: 11/16/2019 11/11/2019  Hearing? Tempie Donning  Vision? N N  Difficulty concentrating or making decisions? N N  Walking or climbing stairs? Y Y  Dressing or bathing? N N  Doing errands, shopping? N N  Some recent data might be hidden    Fall/Depression Screening: Fall Risk  09/21/2020 07/09/2020 03/28/2020  Falls in the past year? 1 1 1   Number falls in past yr: 1 1 1   Injury with Fall? 0 0 0  Risk for fall due to : History of fall(s);Impaired balance/gait;Impaired mobility;Impaired vision History of fall(s);Impaired balance/gait;Impaired mobility;Impaired vision;Medication side effect History of fall(s);Impaired balance/gait;Impaired mobility;Impaired vision;Medication side effect  Follow up Falls evaluation completed;Falls prevention discussed Falls evaluation completed;Education provided;Falls prevention discussed Falls evaluation completed;Education provided;Falls prevention discussed   PHQ 2/9 Scores 12/19/2019 12/10/2018 11/01/2018  PHQ - 2 Score 0 0 0    Assessment:  Goals Addressed            This Visit's Progress   . [facility]) Learn More About My Health       Timeframe:  Long-Range Goal Priority:  High Start Date:         [id number]                    Expected End Date:     [id number]                 Follow Up Date [id number]  - tell my story and reason for my visit - make a list of questions - ask questions - repeat what I heard to make sure I understand - bring a list of my medicines to the visit - speak up when I don't understand    Why is this important?   The best way to learn about your health and care is by talking to the doctor and nurse.   They will answer your questions and give you information in the way that you like best.    Notes:  Per daughter she is going to talk with hospice nurse regarding hospice, palliative care and getting a caregiver.     Marland Kitchen [facility]) Make and Keep All Appointments       Timeframe:  Long-Range Goal Priority:  Medium Start Date:    [id number]                         Expected End Date:   [id number]                   Follow Up Date [id number]   - ask family or friend for a ride - call to cancel if needed - keep a calendar with appointment dates    Why is this important?   Part of staying healthy is seeing the doctor for follow-up care.  If you forget your appointments, there are some things you can do to stay on track.    Notes:     . [facility]) Monitor and Manage My Blood Sugar       Timeframe:  Long-Range Goal Priority:  High Start Date:        [id number]                     Expected End Date:  [id number]                    Follow Up Date 01/11/21   - check blood sugar at prescribed times - enter blood sugar readings and medication or insulin into daily log - take the blood sugar log to all doctor visits - take the blood sugar meter to all doctor visits    Why is this important?   Checking your blood sugar at home helps to keep it from getting very high or very low.  Writing the results in a diary or log helps the doctor know how to care for you.  Your blood sugar log should have the time, date and the results.  Also, write down the amount of insulin or other medicine that you take.  Other information, like what you ate, exercise done and how you were feeling, will also be helpful.     Notes:     . [facility]) Set My Target A1C       Timeframe:  Long-Range Goal Priority:  High Start Date:        [id number]                     Expected End Date:  [id number]  Follow Up Date [id number]   - set target A1C; target is 7; current 6.6    Why is this important?   Your target A1C is decided  together by you and your doctor.  It is based on several things like your age and other health issues.    Note A!C is currently ar=t target. Monitoring for maintaining:     . [facility]) Track and Manage Symptoms       Timeframe:  Long-Range Goal Priority:  Medium Start Date:      [id number]                       Expected End Date:     [id number]                 Follow Up Date 01/11/2021   - develop a rescue plan - eat more whole grains, fruits and vegetables, lean meats and healthy fats - follow rescue plan if symptoms flare-up - know when to call the doctor - track symptoms and what helps feel better or worse -weigh daily    Why is this important?   You will be able to handle your symptoms better if you keep track of them.  Making some simple changes to your lifestyle will help.  Eating healthy is one thing you can do to take good care of yourself.    Notes:        Plan:  Follow-up:  Patient agrees to Care Plan and Follow-up. RN provided calendar book RN will follow up within the month of March RN sent PCP update assessment  [facility] [phone]

## 2020-09-27 NOTE — Patient Instructions (Signed)
Goals Addressed            This Visit's Progress   . [facility]) Learn More About My Health       Timeframe:  Long-Range Goal Priority:  High Start Date:         [id number]                    Expected End Date:     [id number]                 Follow Up Date [id number]  - tell my story and reason for my visit - make a list of questions - ask questions - repeat what I heard to make sure I understand - bring a list of my medicines to the visit - speak up when I don't understand    Why is this important?   The best way to learn about your health and care is by talking to the doctor and nurse.  They will answer your questions and give you information in the way that you like best.    Notes:  Per daughter she is going to talk with hospice nurse regarding hospice, palliative care and getting a caregiver.     Marland Kitchen [facility]) Make and Keep All Appointments       Timeframe:  Long-Range Goal Priority:  Medium Start Date:    [id number]                         Expected End Date:   10272536                   Follow Up Date [id number]   - ask family or friend for a ride - call to cancel if needed - keep a calendar with appointment dates    Why is this important?   Part of staying healthy is seeing the doctor for follow-up care.  If you forget your appointments, there are some things you can do to stay on track.    Notes:     . [facility]) Monitor and Manage My Blood Sugar       Timeframe:  Long-Range Goal Priority:  High Start Date:        [id number]                     Expected End Date:  [id number]                    Follow Up Date 01/11/21   - check blood sugar at prescribed times - enter blood sugar readings and medication or insulin into daily log - take the blood sugar log to all doctor visits - take the blood sugar meter to all doctor visits    Why is this important?   Checking your blood sugar at home helps to keep it from getting very high or very low.  Writing the results in a diary or log helps the  doctor know how to care for you.  Your blood sugar log should have the time, date and the results.  Also, write down the amount of insulin or other medicine that you take.  Other information, like what you ate, exercise done and how you were feeling, will also be helpful.     Notes:     . [facility]) Set My Target A1C       Timeframe:  Long-Range Goal Priority:  High Start Date:  [id number]                     Expected End Date:  14481856                    Follow Up Date [id number]   - set target A1C; target is 7; current 6.6    Why is this important?   Your target A1C is decided together by you and your doctor.  It is based on several things like your age and other health issues.    Note A!C is currently ar=t target. Monitoring for maintaining:     . [facility]) Track and Manage Symptoms       Timeframe:  Long-Range Goal Priority:  Medium Start Date:      [id number]                       Expected End Date:     27741287                 Follow Up Date 01/11/2021   - develop a rescue plan - eat more whole grains, fruits and vegetables, lean meats and healthy fats - follow rescue plan if symptoms flare-up - know when to call the doctor - track symptoms and what helps feel better or worse -weigh daily    Why is this important?   You will be able to handle your symptoms better if you keep track of them.  Making some simple changes to your lifestyle will help.  Eating healthy is one thing you can do to take good care of yourself.    Notes:

## 2020-09-28 ENCOUNTER — Telehealth: Payer: Self-pay | Admitting: Adult Health Nurse Practitioner

## 2020-09-28 NOTE — Telephone Encounter (Signed)
Spoke with patient's wife Pansy regarding the Palliative referral and she requested that I call her daughter, Lacie Draft to schedule visit.  I called daughter and left message with reason for call and left my name and call back number requesting a return call on Tues. 10/02/20 due to the office being closed on Monday.

## 2020-10-02 ENCOUNTER — Telehealth: Payer: Self-pay | Admitting: Adult Health Nurse Practitioner

## 2020-10-02 NOTE — Telephone Encounter (Signed)
Called patient's daughter, [name], to offer to schedule a Palliative Consult, no answer - left message with reason for call along with my name and call back number requesting a return call.

## 2020-10-04 ENCOUNTER — Ambulatory Visit: Payer: Medicare Other | Admitting: Cardiology

## 2020-10-04 ENCOUNTER — Telehealth: Payer: Self-pay | Admitting: Adult Health Nurse Practitioner

## 2020-10-04 NOTE — Telephone Encounter (Signed)
Called patient's wife back and left message, letting her know that I have called her daughter Celene Skeen but I have not rec'd a call back from her.  I asked if there was another number that I could reach her at.  Left my name and call back number requesting a return call.

## 2020-10-05 ENCOUNTER — Ambulatory Visit: Payer: Medicare Other | Admitting: Cardiology

## 2020-10-09 ENCOUNTER — Telehealth: Payer: Self-pay | Admitting: Adult Health Nurse Practitioner

## 2020-10-09 ENCOUNTER — Encounter: Payer: Self-pay | Admitting: *Deleted

## 2020-10-09 NOTE — Progress Notes (Signed)
    Pt was admitted to [facility] division of [facility]--in the home on 10/09/20.  Pt will be receiving nurse visits 1-3 x/month and home-based social worker support as well. ° °Fran Kiser °Referral Specialist °[phone] °

## 2020-10-09 NOTE — Telephone Encounter (Signed)
Spoke with patient's daughter about Palliative referral and she said that patient was admitted to [place].  I will cancel the referral and notify referring MD and Palliative Team.

## 2020-10-10 ENCOUNTER — Telehealth: Payer: Self-pay | Admitting: Cardiology

## 2020-10-10 NOTE — Telephone Encounter (Signed)
New message:      Patient daughter calling to let the doctor no know that her father got sick and he is now with hospice. Please call patient daughter,

## 2020-10-15 ENCOUNTER — Ambulatory Visit: Payer: Medicare Other | Admitting: Cardiology

## 2020-10-16 DIAGNOSIS — N182 Chronic kidney disease, stage 2 (mild): Secondary | ICD-10-CM | POA: Diagnosis not present

## 2020-10-16 DIAGNOSIS — I129 Hypertensive chronic kidney disease with stage 1 through stage 4 chronic kidney disease, or unspecified chronic kidney disease: Secondary | ICD-10-CM | POA: Diagnosis not present

## 2020-10-16 DIAGNOSIS — E1169 Type 2 diabetes mellitus with other specified complication: Secondary | ICD-10-CM | POA: Diagnosis not present

## 2020-10-16 DIAGNOSIS — E782 Mixed hyperlipidemia: Secondary | ICD-10-CM | POA: Diagnosis not present

## 2020-11-13 DEATH — deceased

## 2020-11-14 ENCOUNTER — Ambulatory Visit: Payer: Medicare Other | Admitting: Physician Assistant

## 2020-11-14 ENCOUNTER — Other Ambulatory Visit (HOSPITAL_COMMUNITY): Payer: Medicare Other

## 2020-11-30 ENCOUNTER — Other Ambulatory Visit: Payer: Self-pay | Admitting: *Deleted

## 2020-11-30 NOTE — Patient Outreach (Signed)
[facility]) Care Management  11/30/2020  CONER GIBBARD 26-Mar-1931 [id number]   Case closure. Patient deceased.  [place] [facility] [phone]

## 2020-12-06 ENCOUNTER — Ambulatory Visit: Payer: Self-pay | Admitting: *Deleted

## 2021-12-27 IMAGING — CT CT ANGIO CHEST
2 of 8 series · 12 of 36 positions shown · IV contrast (APPLIED)
Comparison: Chest CTA 08/30/2017.

CLINICAL DATA: 88-year-old male with history of severe aortic
stenosis. Preprocedural study prior to potential transcatheter
aortic valve replacement (TAVR) procedure.

EXAM:
CT ANGIOGRAPHY CHEST, ABDOMEN AND PELVIS
TECHNIQUE: Multidetector CT imaging through the chest, abdomen and pelvis was
performed using the standard protocol during bolus administration of
intravenous contrast. Multiplanar reconstructed images and MIPs were
obtained and reviewed to evaluate the vascular anatomy.
CONTRAST:  100mL OMNIPAQUE IOHEXOL 350 MG/ML SOLN

[Series 9: ax thins · axial · 0.59mm/px · z∈[+664,+1224]mm · 11 of 673 slices shown]
[im 57/673  lung]
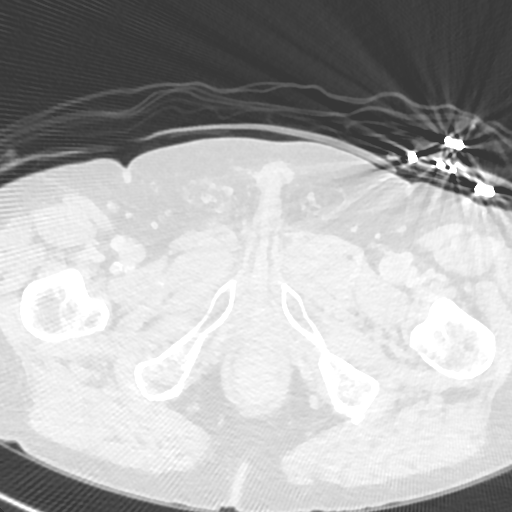
[im 113/673  mediastinal]
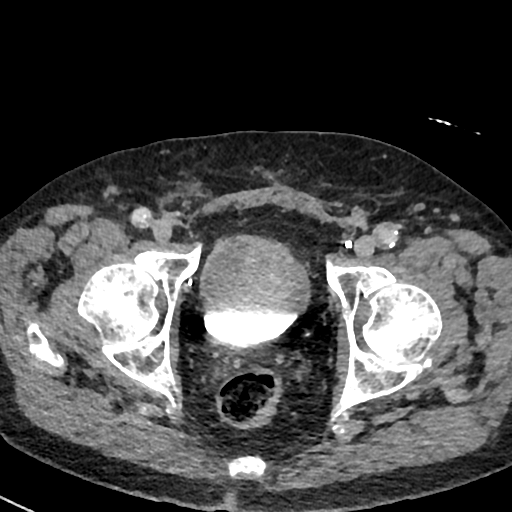
[im 169/673  lung]
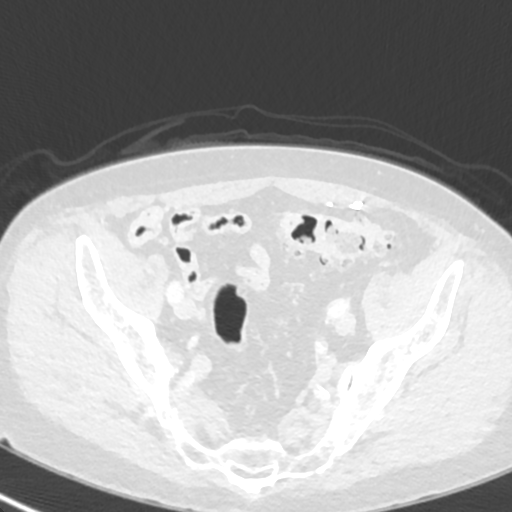
[im 225/673  mediastinal]
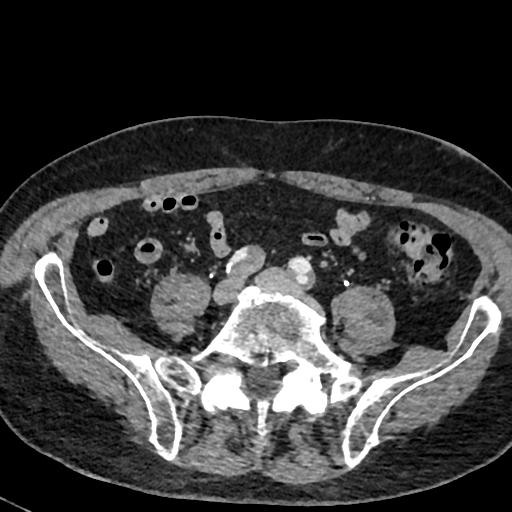
[im 281/673  lung]
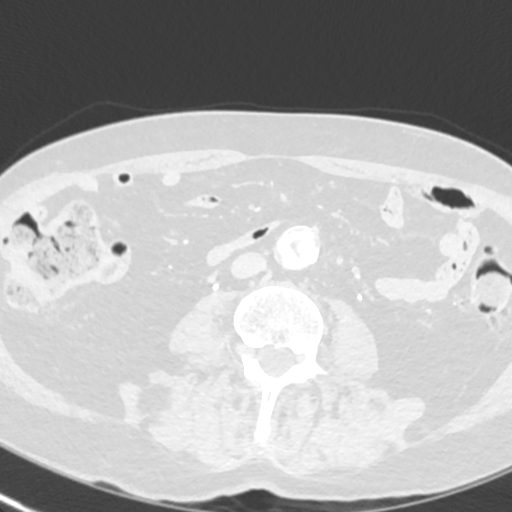
[im 337/673  mediastinal]
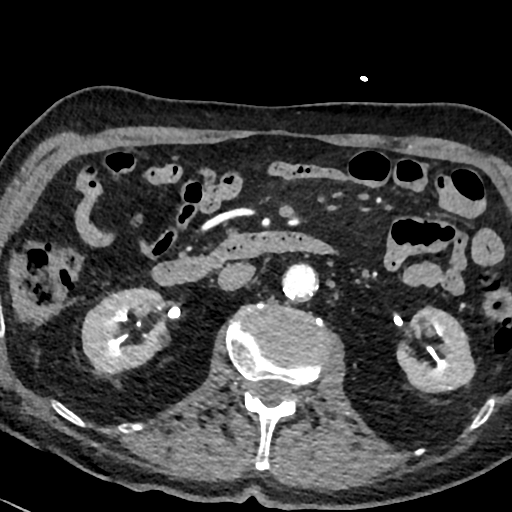
[im 393/673  lung]
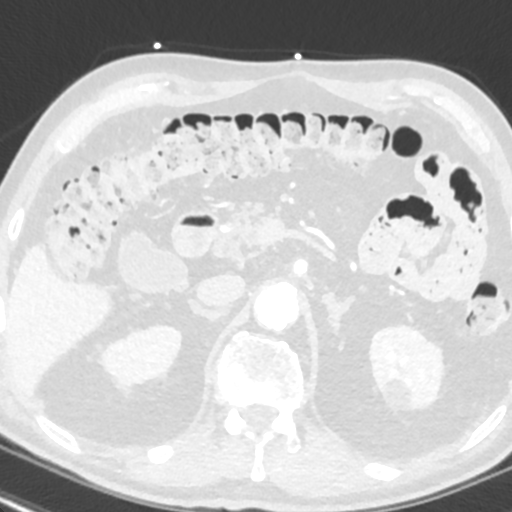
[im 449/673  mediastinal]
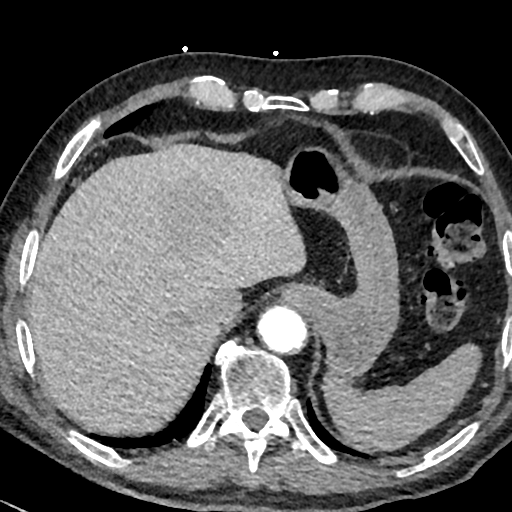
[im 505/673  lung]
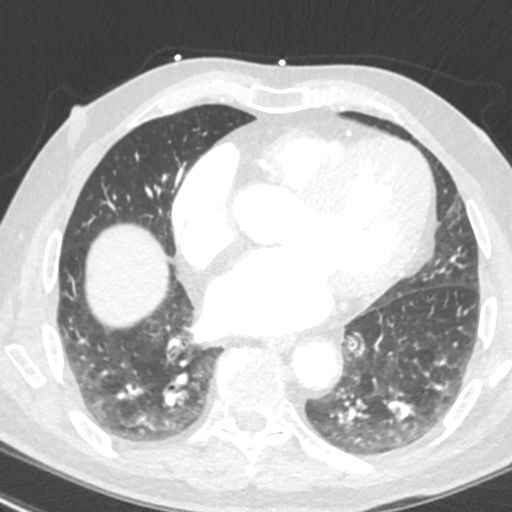
[im 561/673  mediastinal]
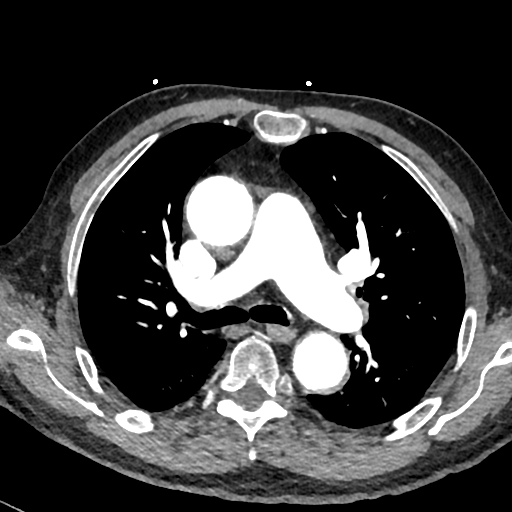
[im 617/673  lung]
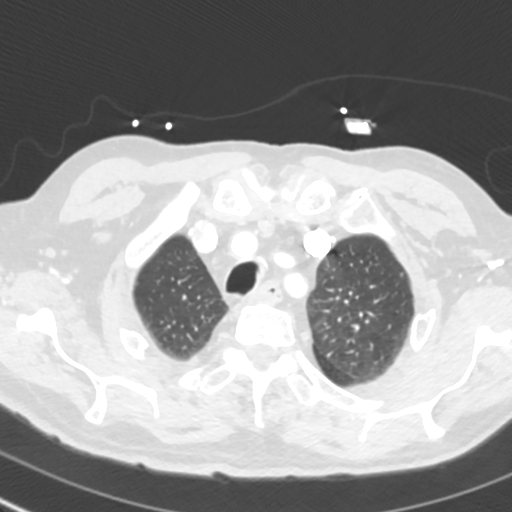

[Series 13: cor · coronal · 0.78mm/px · 1 of 138 slices shown]
[im 69/138  mediastinal]
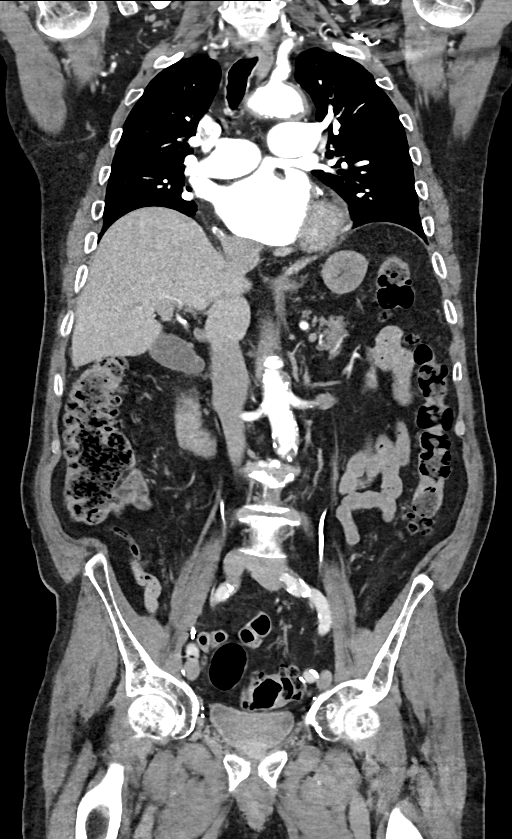

[12 of 36 positions shown; findings below may reference images not displayed]

FINDINGS: Comment: Today's study is suboptimal related to poor contrast bolus
(which may be related to reduced cardiac output).

CTA CHEST FINDINGS

Cardiovascular: Heart size is mildly enlarged. There is no
significant pericardial fluid, thickening or pericardial
calcification. There is aortic atherosclerosis, as well as
atherosclerosis of the great vessels of the mediastinum and the
coronary arteries, including calcified atherosclerotic plaque in the
left main, left anterior descending, left circumflex and right
coronary arteries. Severe thickening calcification of the aortic
valve, compatible with reported clinical history of severe aortic
stenosis. Ectasia of the ascending thoracic aorta (4.3 cm in
diameter).

Mediastinum/Lymph Nodes: No pathologically enlarged mediastinal or
hilar lymph nodes. Esophagus is unremarkable in appearance. No
axillary lymphadenopathy.

Lungs/Pleura: No acute consolidative airspace disease. No pleural
effusions. No definite suspicious appearing pulmonary nodules or
masses are noted.

Musculoskeletal/Soft Tissues: There are no aggressive appearing
lytic or blastic lesions noted in the visualized portions of the
skeleton.

CTA ABDOMEN AND PELVIS FINDINGS

Hepatobiliary: 2 liver lesions poorly characterized on today's early
arterial phase examination. The largest of these is in segment 4A of
the liver (axial image 73 of series 11) measuring 5.0 x 4.3 cm which
is centrally very low attenuation, but demonstrates a thick
peripheral rim of intermediate to higher attenuation. No intra or
extrahepatic biliary ductal dilatation. Gallbladder is normal in
appearance.

Pancreas: No pancreatic mass. No pancreatic ductal dilatation. No
pancreatic or peripancreatic fluid collections or inflammatory
changes.

Spleen: Unremarkable.

Adrenals/Urinary Tract: 1.8 cm low-attenuation lesion in the upper
pole the left kidney, compatible with a simple cyst. Subcentimeter
low-attenuation lesions noted elsewhere in both kidneys, too small
to definitively characterize, but statistically likely to represent
tiny cysts. No suspicious renal lesions. Bilateral adrenal glands
are normal in appearance. No hydroureteronephrosis. Urinary bladder
is unremarkable in appearance.

Stomach/Bowel: Normal appearance of the stomach. No pathologic
dilatation of small bowel or colon. Numerous colonic diverticulae
are noted, particularly in the sigmoid colon, without surrounding
inflammatory changes to suggest an acute diverticulitis at this
time. Normal appendix.

Vascular/Lymphatic: Severe aortic atherosclerosis, with vascular
findings and measurements pertinent to potential TAVR procedure, as
detailed above. Complex atheromatous plaque in the distal infrarenal
abdominal aorta with internal vascular webs and multiple channels
best appreciated at axial image 131 of series 11, which could
provide challenges for intravascular procedures. No lymphadenopathy
noted in the abdomen or pelvis.

Reproductive: Status post radical prostatectomy.

Other: No significant volume of ascites.  No pneumoperitoneum.

Musculoskeletal: There are no aggressive appearing lytic or blastic
lesions noted in the visualized portions of the skeleton.

VASCULAR MEASUREMENTS PERTINENT TO TAVR:

AORTA:

Minimal Aortic Uiameter-J.A x 1.5 mm

Severity of Aortic Calcification-moderate

RIGHT PELVIS:

Right Common Iliac Artery -

Minimal Riameter-R.Q x 4.4 mm

Tortuosity-mild

Calcification-moderate

Right External Iliac Artery -

Minimal Tiameter-N.N x 6.7 mm

Tortuosity-moderate to severe

Calcification-mild

Right Common Femoral Artery -

Minimal Ciameter-Z.H x 7.2 mm

Tortuosity-mild

Calcification-mild

LEFT PELVIS:

Left Common Iliac Artery -

Minimal Giameter-3.3 x 5.8 mm

Tortuosity-mild

Calcification-moderate

Left External Iliac Artery -

Minimal Kiameter-Q.P x 7.2 mm

Tortuosity-severe

Calcification-mild

Left Common Femoral Artery -

Minimal Aiameter-F.A x 6.0 mm

Tortuosity-mild

Calcification-mild

Review of the MIP images confirms the above findings.
IMPRESSION: 1. Vascular findings and measurements pertinent to potential TAVR
procedure, as detailed above. In addition to those findings, please
take note of the complex atheromatous plaque in the distal
infrarenal abdominal aorta where there are multiple vascular webs
which divide the aortic lumen into multiple channels. This may have
implications for intravascular procedures.
2. Severe thickening and calcification of the aortic valve,
compatible with the reported clinical history of severe aortic
stenosis.
3. Indeterminate liver lesions. Follow-up evaluation with
nonemergent abdominal MRI with and without IV gadolinium is
recommended in the near future to better characterize these findings
and exclude malignancy.
4. Aortic atherosclerosis, in addition to left main and 3 vessel
coronary artery disease.
5. Ectasia of ascending thoracic aorta (4.3 cm in diameter).
Recommend annual imaging followup by CTA or MRA. This recommendation
follows 0989 ACCF/AHA/AATS/ACR/ASA/SCA/CDY/ERXLEBEN/LOCKLEAR/ATIQA Guidelines
for the Diagnosis and Management of Patients with Thoracic Aortic
Disease. Circulation. 0989; 121: E266-e369. Aortic aneurysm NOS
(YWV11-DD9.2).
6. Colonic diverticulosis without evidence of acute diverticulitis
at this time.
7. Additional incidental findings, as above.
